# Patient Record
Sex: Male | Born: 1950 | Race: White | Hispanic: No | Marital: Married | Smoking: Former smoker
Health system: Southern US, Community
[De-identification: ages and names within clinical notes are randomized; demographics above are authoritative.]

## PROBLEM LIST (undated history)

## (undated) DIAGNOSIS — E785 Hyperlipidemia, unspecified: Secondary | ICD-10-CM

## (undated) DIAGNOSIS — R0600 Dyspnea, unspecified: Secondary | ICD-10-CM

## (undated) DIAGNOSIS — I10 Essential (primary) hypertension: Secondary | ICD-10-CM

## (undated) DIAGNOSIS — K219 Gastro-esophageal reflux disease without esophagitis: Secondary | ICD-10-CM

## (undated) DIAGNOSIS — J189 Pneumonia, unspecified organism: Secondary | ICD-10-CM

## (undated) DIAGNOSIS — Z86010 Personal history of colonic polyps: Principal | ICD-10-CM

## (undated) HISTORY — PX: HERNIA REPAIR: SHX51

## (undated) HISTORY — PX: UPPER GI ENDOSCOPY: SHX6162

## (undated) HISTORY — DX: Hyperlipidemia, unspecified: E78.5

## (undated) HISTORY — PX: UPPER GASTROINTESTINAL ENDOSCOPY: SHX188

## (undated) HISTORY — PX: FRACTURE SURGERY: SHX138

## (undated) HISTORY — DX: Essential (primary) hypertension: I10

## (undated) HISTORY — DX: Personal history of colonic polyps: Z86.010

## (undated) HISTORY — PX: WISDOM TOOTH EXTRACTION: SHX21

## (undated) HISTORY — PX: OTHER SURGICAL HISTORY: SHX169

## (undated) SURGERY — Surgical Case
Anesthesia: *Unknown

---

## 2003-09-07 HISTORY — PX: COLONOSCOPY: SHX174

## 2006-07-13 ENCOUNTER — Ambulatory Visit: Payer: Self-pay | Admitting: Internal Medicine

## 2006-07-22 ENCOUNTER — Ambulatory Visit: Payer: Self-pay | Admitting: Internal Medicine

## 2006-07-22 DIAGNOSIS — Z8719 Personal history of other diseases of the digestive system: Secondary | ICD-10-CM

## 2006-07-22 HISTORY — DX: Personal history of other diseases of the digestive system: Z87.19

## 2006-09-07 ENCOUNTER — Ambulatory Visit: Payer: Self-pay | Admitting: Internal Medicine

## 2007-08-14 ENCOUNTER — Ambulatory Visit: Payer: Self-pay | Admitting: Internal Medicine

## 2007-09-14 ENCOUNTER — Ambulatory Visit: Payer: Self-pay | Admitting: Internal Medicine

## 2007-12-26 ENCOUNTER — Encounter: Admission: RE | Admit: 2007-12-26 | Discharge: 2007-12-26 | Payer: Self-pay | Admitting: Family Medicine

## 2010-04-30 ENCOUNTER — Encounter: Admission: RE | Admit: 2010-04-30 | Discharge: 2010-04-30 | Payer: Self-pay | Admitting: Family Medicine

## 2011-01-22 NOTE — Assessment & Plan Note (Signed)
[facility]                           GASTROENTEROLOGY OFFICE NOTE   NAME:Russell [name], Russell [name]                        MRN:          [id number]  DATE:07/13/2006                            DOB:          December 21, 1950    REASON FOR CONSULTATION:  Reflux, dysphagia.   ASSESSMENT:  A 60 year old white man with several-week history of heartburn  and indigestion, as well as some mild solid food and liquid dysphagia.  Things seem to be improving on PPI therapy.   PLAN:  Because of the dysphagia history I think an endoscopy is appropriate.  If that is all unrevealing could consider a gallbladder ultrasound, but I do  not know that that would have caused dysphagia symptoms.  He has not had  chronic heartburn.  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  This is discussed with the patient in that quitting smoking is to  his benefit.   HISTORY:  A 60 year old white man who several weeks ago ate a large meal,  woke up with heartburn and indigestion problems with pyrosis.  He has had  some difficulty swallowing some solid food with a suprasternal sticking  point as well as liquids.  I do not think he has had to regurgitate  anything.  There has been a 6-pound weight loss because he has changed his  diet and he is avoiding a lot of spicy foods, etc.  In the past he would  have some intermittent heartburn and indigestion with spicy or hot foods,  but no regular basis of that.   CURRENT MEDICATIONS:  1. Protonix 40 mg daily (he had tried Prilosec OTC and was changed to      this, and was also given Carafate which he stopped).  2. Benicar 20/12.5 mg daily.  3. Glucosamine daily.  4. Baby aspirin daily.   DRUG ALLERGIES:  None known.   PAST MEDICAL HISTORY:  1. 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  2. Hypertension.  3. Hernia repair in 1993.   FAMILY HISTORY:  Noncontributory.  Mother had heart disease and diabetes.   SOCIAL HISTORY:  He is  married, works at [employer], one daughter.  Three to  four alcoholic beverages a week.  Smokes a pack per day.   REVIEW OF SYSTEMS:  All other systems are negative.   PHYSICAL EXAMINATION:  Well-developed, well-nourished white man.  Height 5  feet 10 inches, weight 164 pounds, blood pressure 150/80, pulse 72.  EYES:  Anicteric.  MOUTH:  Posterior pharynx shows some mild posterior pharyngeal erythema (no  hoarseness, cough, sore throat).  NECK:  Supple, no mass or thyromegaly.  CHEST:  Clear.  HEART:  S1, S2.  No murmurs or gallops.  ABDOMEN:  Soft, nontender, no organomegaly.  EXTREMITIES:  No edema.  LYMPH NODES:  No neck or supraclavicular nodes.  PSYCHIATRIC:  He is alert and oriented x3.   I appreciate the opportunity to care for this patient.     Iva Boop, MD,FACG  Electronically Signed    CEG/MedQ  DD: 07/13/2006  DT: 07/13/2006  Job #:  [id number]   cc:   Loel Lofty, M.D.

## 2011-01-22 NOTE — Assessment & Plan Note (Signed)
[facility]                         GASTROENTEROLOGY OFFICE NOTE   NAME:[name], [name]                        MRN:          [id number]  DATE:09/07/2006                            DOB:          [birth year]    CHIEF COMPLAINT:  Followup of reflux.   He stopped his Protonix a little while ago, actually after his  procedure, and he has had no more heartburn or dysphagia.  He had grade  A reflux esophagitis, and what I think was a muscular ring and a small  sliding hiatal hernia.  His other medications are listed in the chart.  He is still smoking and is counseled to quit.   Weight 165 pounds, height 5 feet 11 inches, pulse 65, blood pressure  110/70.   ASSESSMENT:  1. Gastroesophageal reflux disease with a hiatal hernia.  2. Dysphagia.  He is asymptomatic off medications at this time.  He      really had some sort of an acute flare of symptoms that has come      and gone.   RECOMMENDATIONS AND PLAN:  Leave him off of proton pump inhibitor  therapy at this time.  Work on stopping smoking.  If he develops  symptoms again, go back on a proton pump inhibitor and let me know.  I  told him that he may go on to require chronic therapy.  We just do not  know at this point.     Iva Boop, MD,FACG  Electronically Signed    CEG/MedQ  DD: 09/07/2006  DT: 09/07/2006  Job #: [id number]   cc:   Russell [name], M.D.

## 2011-03-04 IMAGING — CT CT CHEST W/ CM
3 of 4 series · 17 of 30 positions shown, 19 images · IV contrast (agent unspecified)
Comparison: [facility] chest radiographs 04/22/2010 (no
report available).

CLINICAL DATA: Possible right lung nodule.  No history of
malignancy.

CT CHEST WITH CONTRAST
TECHNIQUE: Multidetector CT imaging of the chest was performed
following the standard protocol during bolus administration of
intravenous contrast.
Contrast: 75 ml Ymnipaque-AQQ intravenously.

[Series 3: routine chest · axial · 0.76mm/px · z∈[-254,-39]mm · 5 of 65 slices shown, 7 images]
[im 11/65  mediastinal]
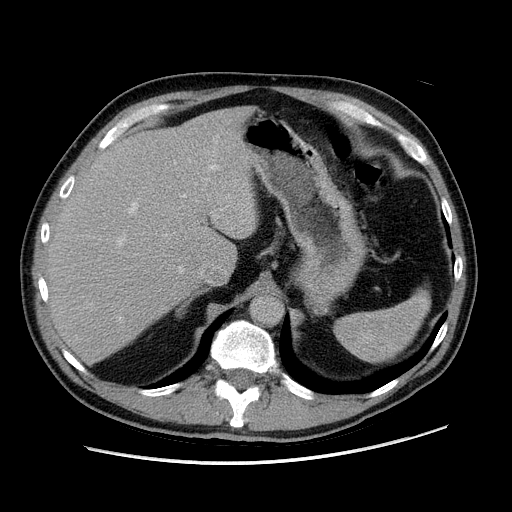
[im 11/65  lung]
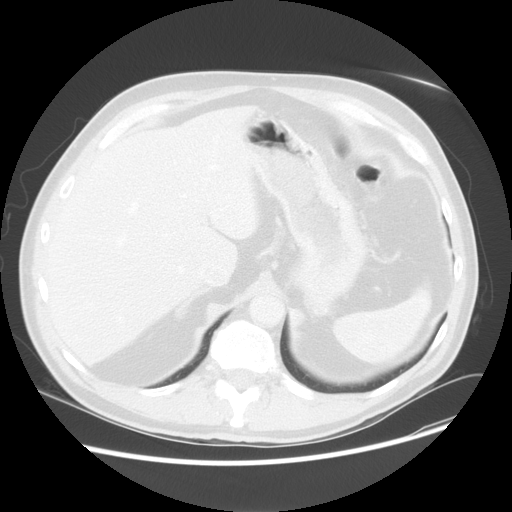
[im 22/65  lung]
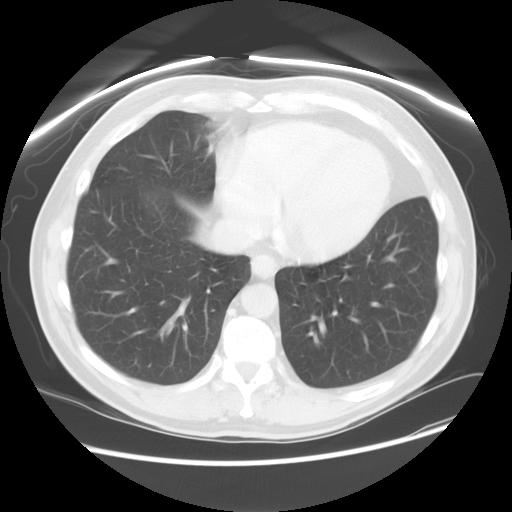
[im 33/65  lung]
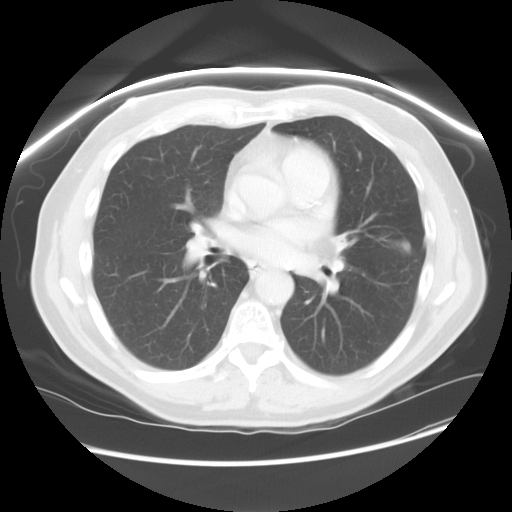
[im 43/65  lung]
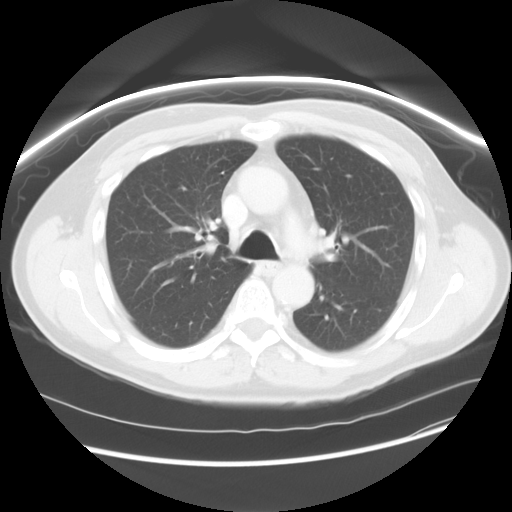
[im 54/65  mediastinal]
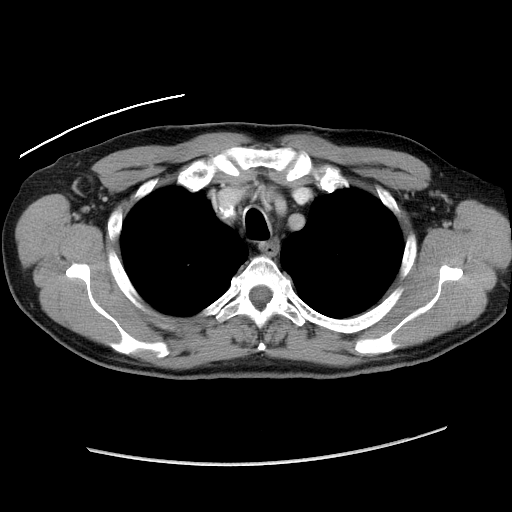
[im 54/65  lung]
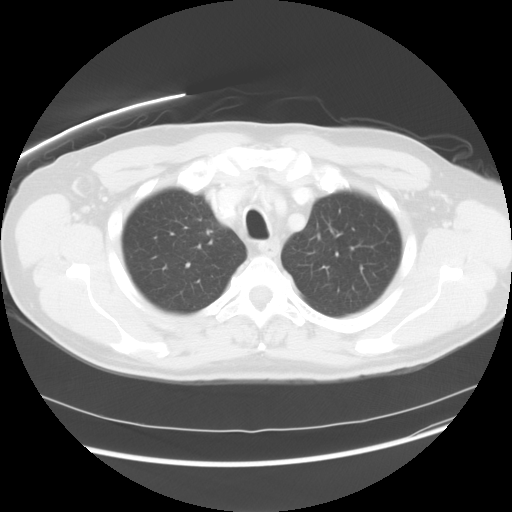

[Series 4: lung windows · axial · 0.76mm/px · z∈[-224,-44]mm · 4 of 61 slices shown]
[im 13/61  lung]
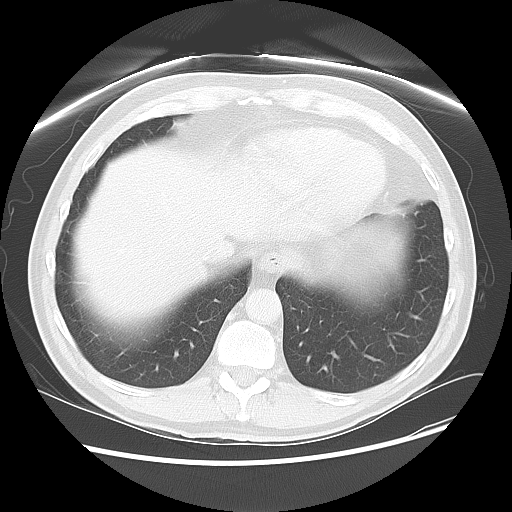
[im 25/61  lung]
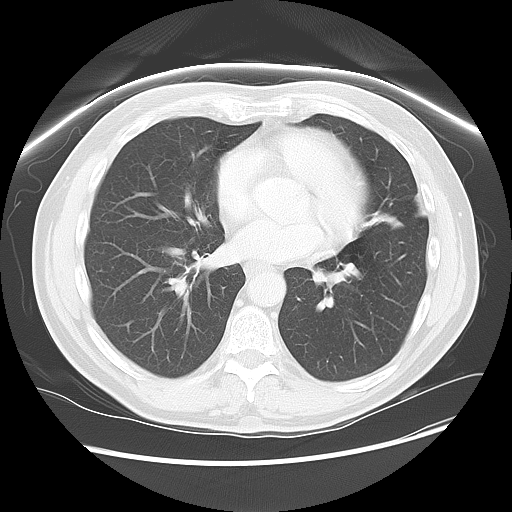
[im 37/61  lung]
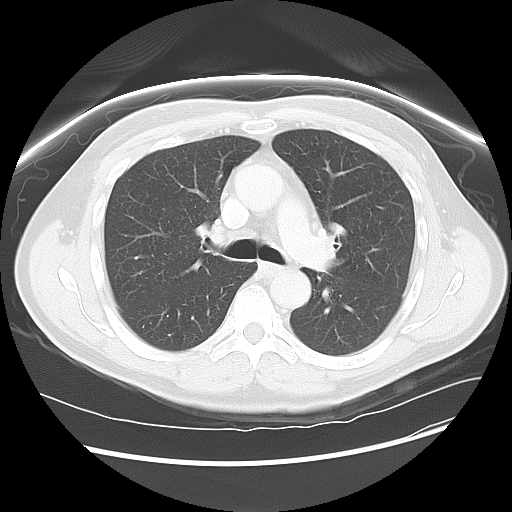
[im 49/61  lung]
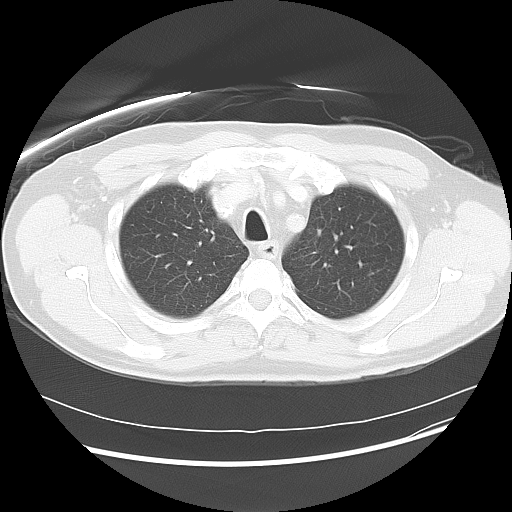

[Series 602: sagittal body · sagittal · 0.76mm/px · 8 of 156 slices shown]
[im 12/156  mediastinal]
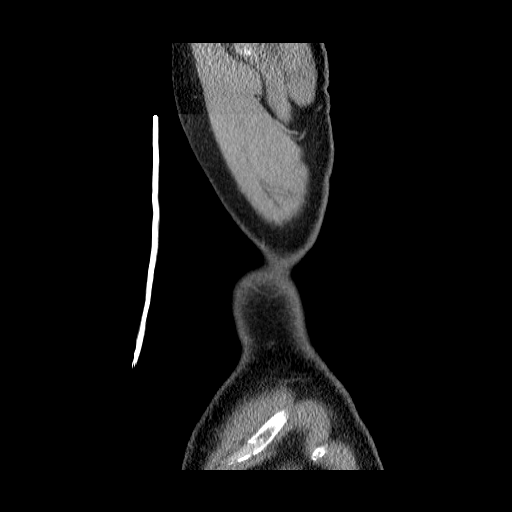
[im 34/156  mediastinal]
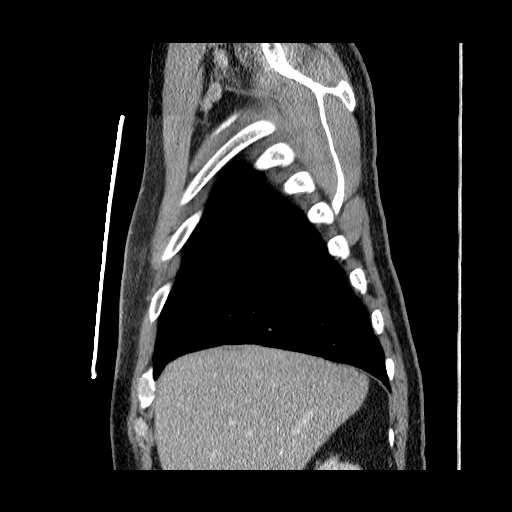
[im 56/156  mediastinal]
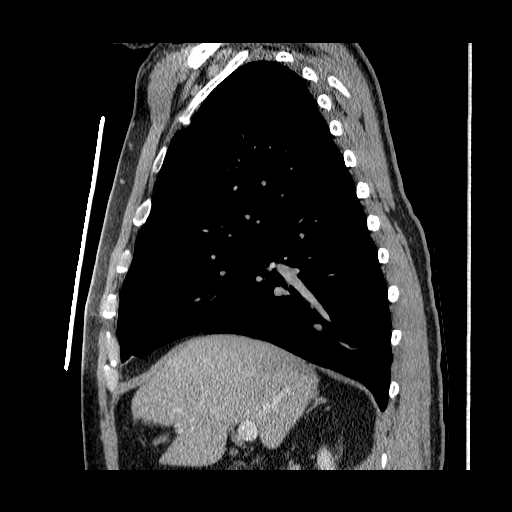
[im 67/156  mediastinal]
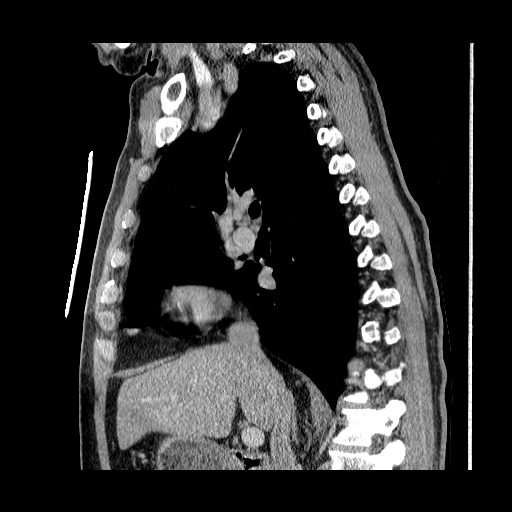
[im 89/156  mediastinal]
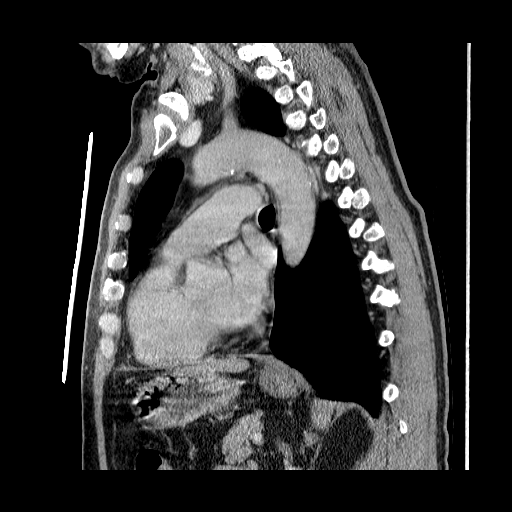
[im 100/156  mediastinal]
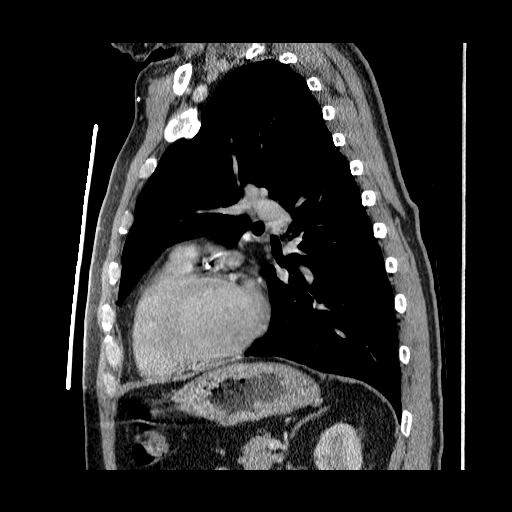
[im 122/156  mediastinal]
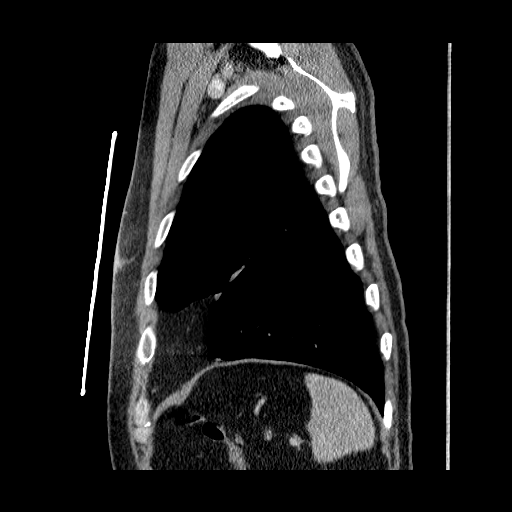
[im 144/156  mediastinal]
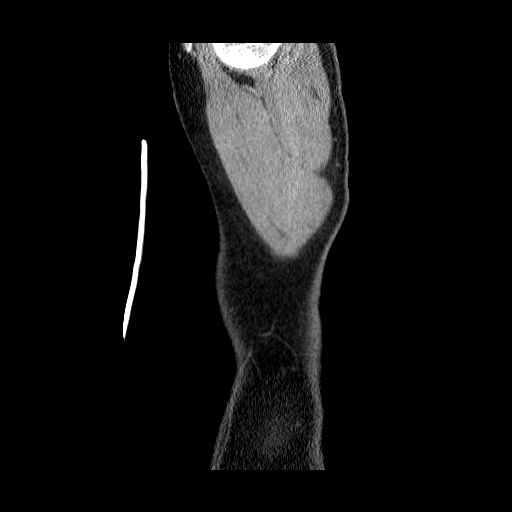

[17 of 30 positions shown; findings below may reference images not displayed]

FINDINGS: There are no enlarged mediastinal or hilar lymph nodes.
There is no pleural or pericardial effusion.  Coronary artery
calcifications are noted.

There is no evidence of pulmonary nodule.  The right lung is clear.
There is lingular atelectasis or scarring along the inferior aspect
of the major fissure adjacent to the left heart border.  There is
no confluent airspace opacity.

The visualized upper abdomen appears unremarkable.
IMPRESSION: 1.  No evidence of right basilar pulmonary nodule.
2.  Lingular scarring or atelectasis is probably longstanding and
unchanged from recent radiographs.  Correlation with old films or
radiographic followup would be helpful to document stability.
3.  No lymphadenopathy or pleural effusion.
4.  Coronary artery disease.

## 2012-08-24 ENCOUNTER — Encounter: Payer: Self-pay | Admitting: Internal Medicine

## 2013-05-04 ENCOUNTER — Encounter: Payer: Self-pay | Admitting: Internal Medicine

## 2013-09-03 ENCOUNTER — Other Ambulatory Visit: Payer: Self-pay | Admitting: Nurse Practitioner

## 2013-10-02 ENCOUNTER — Other Ambulatory Visit: Payer: Self-pay | Admitting: Nurse Practitioner

## 2013-10-04 ENCOUNTER — Ambulatory Visit: Payer: Self-pay | Admitting: Family Medicine

## 2013-10-10 ENCOUNTER — Ambulatory Visit (INDEPENDENT_AMBULATORY_CARE_PROVIDER_SITE_OTHER): Payer: 59 | Admitting: Family Medicine

## 2013-10-10 ENCOUNTER — Encounter: Payer: Self-pay | Admitting: Family Medicine

## 2013-10-10 VITALS — BP 144/79 | HR 54 | Temp 97.9°F | Ht 70.0 in | Wt 174.0 lb

## 2013-10-10 DIAGNOSIS — Z0000 Encounter for general adult medical examination without abnormal findings: Secondary | ICD-10-CM

## 2013-10-10 LAB — POCT CBC
Granulocyte percent: 70.1 %G (ref 37–80)
HCT, POC: 46 % (ref 43.5–53.7)
Hemoglobin: 15.5 g/dL (ref 14.1–18.1)
Lymph, poc: 2.7 (ref 0.6–3.4)
MCH, POC: 33.1 pg — AB (ref 27–31.2)
MCHC: 33.7 g/dL (ref 31.8–35.4)
MCV: 98.1 fL — AB (ref 80–97)
MPV: 8.2 fL (ref 0–99.8)
POC Granulocyte: 6.9 (ref 2–6.9)
POC LYMPH PERCENT: 27.7 %L (ref 10–50)
Platelet Count, POC: 168 10*3/uL (ref 142–424)
RBC: 4.7 M/uL (ref 4.69–6.13)
RDW, POC: 13.2 %
WBC: 9.9 10*3/uL (ref 4.6–10.2)

## 2013-10-10 MED ORDER — OLMESARTAN MEDOXOMIL-HCTZ 20-12.5 MG PO TABS: 1.0000 | ORAL_TABLET | Freq: Every day | ORAL | Status: DC

## 2013-10-10 NOTE — Progress Notes (Signed)
   Subjective:    Patient ID: Russell [name], male    DOB: 02/24/1951, 62 y.o.   MRN: [id number]  HPI  This 62 y.o. male presents for evaluation of CPE and labs.  He has hx of hypertension.  Review of Systems    No chest pain, SOB, HA, dizziness, vision change, N/V, diarrhea, constipation, dysuria, urinary urgency or frequency, myalgias, arthralgias or rash.  Objective:   Physical Exam  Vital signs noted  Well developed well nourished male.  HEENT - Head atraumatic Normocephalic                Eyes - PERRLA, Conjuctiva - clear Sclera- Clear EOMI                Ears - EAC's Wnl TM's Wnl Gross Hearing WNL                Nose - Nares patent                 Throat - oropharanx wnl Respiratory - Lungs CTA bilateral Cardiac - RRR S1 and S2 without murmur. BP 130/70 GI - Abdomen soft Nontender and bowel sounds active x 4 Extremities - No edema. Neuro - Grossly intact.      Assessment & Plan:  Routine general medical examination at a health care facility - Plan: olmesartan-hydrochlorothiazide (BENICAR HCT) 20-12.5 MG per tablet, POCT CBC, CMP14+EGFR, PSA, total and free, Lipid panel, Vit D  25 hydroxy (rtn osteoporosis monitoring), TSH  William J Oxford FNP 

## 2013-10-12 ENCOUNTER — Telehealth: Payer: Self-pay | Admitting: Family Medicine

## 2013-10-12 ENCOUNTER — Other Ambulatory Visit: Payer: Self-pay | Admitting: Family Medicine

## 2013-10-12 LAB — CMP14+EGFR
ALT: 22 IU/L (ref 0–44)
AST: 22 IU/L (ref 0–40)
Albumin/Globulin Ratio: 2 (ref 1.1–2.5)
Albumin: 4.4 g/dL (ref 3.6–4.8)
Alkaline Phosphatase: 62 IU/L (ref 39–117)
BUN/Creatinine Ratio: 25 — ABNORMAL HIGH (ref 10–22)
BUN: 23 mg/dL (ref 8–27)
CO2: 24 mmol/L (ref 18–29)
Calcium: 9.6 mg/dL (ref 8.6–10.2)
Chloride: 101 mmol/L (ref 97–108)
Creatinine, Ser: 0.92 mg/dL (ref 0.76–1.27)
GFR calc Af Amer: 103 mL/min/{1.73_m2} (ref >59)
GFR calc non Af Amer: 89 mL/min/{1.73_m2} (ref >59)
Globulin, Total: 2.2 g/dL (ref 1.5–4.5)
Glucose: 78 mg/dL (ref 65–99)
Potassium: 6.3 mmol/L (ref 3.5–5.2)
Sodium: 139 mmol/L (ref 134–144)
Total Bilirubin: 0.3 mg/dL (ref 0.0–1.2)
Total Protein: 6.6 g/dL (ref 6.0–8.5)

## 2013-10-12 LAB — VITAMIN D 25 HYDROXY (VIT D DEFICIENCY, FRACTURES): Vit D, 25-Hydroxy: 69 ng/mL (ref 30.0–100.0)

## 2013-10-12 LAB — LIPID PANEL
Chol/HDL Ratio: 3.3 ratio units (ref 0.0–5.0)
Cholesterol, Total: 198 mg/dL (ref 100–199)
HDL: 60 mg/dL (ref >39)
LDL Calculated: 113 mg/dL — ABNORMAL HIGH (ref 0–99)
Triglycerides: 123 mg/dL (ref 0–149)
VLDL Cholesterol Cal: 25 mg/dL (ref 5–40)

## 2013-10-12 LAB — PSA, TOTAL AND FREE
PSA, Free Pct: 32 %
PSA, Free: 0.16 ng/mL
PSA: 0.5 ng/mL (ref 0.0–4.0)

## 2013-10-12 LAB — POTASSIUM: Potassium: 3.9 mEq/L (ref 3.5–5.3)

## 2013-10-12 LAB — TSH: TSH: 2.84 u[IU]/mL (ref 0.450–4.500)

## 2013-10-12 NOTE — Telephone Encounter (Signed)
Spoke with patient and advised to go to [facility] per Dr Sabas Sous order to have potassium rechecked

## 2013-10-31 ENCOUNTER — Other Ambulatory Visit: Payer: Self-pay | Admitting: Family Medicine

## 2014-10-11 ENCOUNTER — Ambulatory Visit (INDEPENDENT_AMBULATORY_CARE_PROVIDER_SITE_OTHER): Payer: 59 | Admitting: Family Medicine

## 2014-10-11 ENCOUNTER — Encounter: Payer: Self-pay | Admitting: Family Medicine

## 2014-10-11 VITALS — BP 127/73 | HR 61 | Temp 98.2°F | Ht 70.0 in | Wt 172.0 lb

## 2014-10-11 DIAGNOSIS — I10 Essential (primary) hypertension: Secondary | ICD-10-CM | POA: Insufficient documentation

## 2014-10-11 DIAGNOSIS — Z0000 Encounter for general adult medical examination without abnormal findings: Secondary | ICD-10-CM

## 2014-10-11 LAB — POCT CBC
Granulocyte percent: 70.6 %G (ref 37–80)
HCT, POC: 46.8 % (ref 43.5–53.7)
HEMOGLOBIN: 14.9 g/dL (ref 14.1–18.1)
LYMPH, POC: 2.5 (ref 0.6–3.4)
MCH: 31 pg (ref 27–31.2)
MCHC: 31.9 g/dL (ref 31.8–35.4)
MCV: 97.1 fL — AB (ref 80–97)
MPV: 8.2 fL (ref 0–99.8)
PLATELET COUNT, POC: 205 10*3/uL (ref 142–424)
POC Granulocyte: 7.1 — AB (ref 2–6.9)
POC LYMPH %: 25.3 % (ref 10–50)
RBC: 4.8 M/uL (ref 4.69–6.13)
RDW, POC: 13 %
WBC: 10 10*3/uL (ref 4.6–10.2)

## 2014-10-12 ENCOUNTER — Other Ambulatory Visit: Payer: Self-pay | Admitting: Family Medicine

## 2014-10-12 LAB — PSA, TOTAL AND FREE
PSA FREE PCT: 30 %
PSA, Free: 0.15 ng/mL
PSA: 0.5 ng/mL (ref 0.0–4.0)

## 2014-10-12 LAB — CMP14+EGFR
A/G RATIO: 1.9 (ref 1.1–2.5)
ALBUMIN: 4.1 g/dL (ref 3.6–4.8)
ALT: 19 IU/L (ref 0–44)
AST: 18 IU/L (ref 0–40)
Alkaline Phosphatase: 64 IU/L (ref 39–117)
BILIRUBIN TOTAL: 0.3 mg/dL (ref 0.0–1.2)
BUN/Creatinine Ratio: 24 — ABNORMAL HIGH (ref 10–22)
BUN: 20 mg/dL (ref 8–27)
CALCIUM: 9.4 mg/dL (ref 8.6–10.2)
CO2: 26 mmol/L (ref 18–29)
Chloride: 102 mmol/L (ref 97–108)
Creatinine, Ser: 0.84 mg/dL (ref 0.76–1.27)
GFR calc Af Amer: 108 mL/min/{1.73_m2} (ref >59)
GFR calc non Af Amer: 93 mL/min/{1.73_m2} (ref >59)
GLOBULIN, TOTAL: 2.2 g/dL (ref 1.5–4.5)
Glucose: 76 mg/dL (ref 65–99)
Potassium: 4.4 mmol/L (ref 3.5–5.2)
SODIUM: 141 mmol/L (ref 134–144)
Total Protein: 6.3 g/dL (ref 6.0–8.5)

## 2014-10-12 LAB — LIPID PANEL
CHOL/HDL RATIO: 3 ratio (ref 0.0–5.0)
Cholesterol, Total: 187 mg/dL (ref 100–199)
HDL: 63 mg/dL (ref >39)
LDL CALC: 94 mg/dL (ref 0–99)
Triglycerides: 149 mg/dL (ref 0–149)
VLDL CHOLESTEROL CAL: 30 mg/dL (ref 5–40)

## 2014-10-15 NOTE — Progress Notes (Signed)
Subjective:  Patient ID: Russell [name], male    DOB: 1950-11-04  Age: 64 y.o. MRN: [id number]  CC: Annual Exam   HPI CHIKE FARRINGTON presents for complete physical exam.  Patient in for follow-up of hypertension. Patient has no history of headache chest pain or shortness of breath or recent cough. Patient also denies symptoms of TIA such as numbness weakness lateralizing. Patient checks  blood pressure at home and has not had any elevated readings recently.    History Abishai has a past medical history of Hypertension.   He has past surgical history that includes Hernia repair and Fracture surgery (Left, age 58).   His family history includes Diabetes in his mother; Heart disease in his mother; Hyperlipidemia in his father.He reports that he has been smoking.  He has never used smokeless tobacco. He reports that he drinks about 2.0 oz of alcohol per week. He reports that he does not use illicit drugs.  Current Outpatient Prescriptions on File Prior to Visit  Medication Sig Dispense Refill  . aspirin EC 81 MG tablet Take 81 mg by mouth every other day.      No current facility-administered medications on file prior to visit.    ROS Review of Systems  Constitutional: Negative for fever, chills, diaphoresis, activity change, appetite change, fatigue and unexpected weight change.  HENT: Negative for congestion, ear pain, hearing loss, postnasal drip, rhinorrhea, sore throat, tinnitus and trouble swallowing.   Eyes: Negative for photophobia, pain, discharge and redness.  Respiratory: Negative for apnea, cough, choking, chest tightness, shortness of breath, wheezing and stridor.   Cardiovascular: Negative for chest pain, palpitations and leg swelling.  Gastrointestinal: Negative for nausea, vomiting, abdominal pain, diarrhea, constipation, blood in stool and abdominal distention.  Endocrine: Negative for cold intolerance, heat intolerance, polydipsia, polyphagia and polyuria.    Genitourinary: Negative for dysuria, urgency, frequency, hematuria, flank pain, enuresis, difficulty urinating and genital sores.  Musculoskeletal: Negative for joint swelling and arthralgias.  Skin: Negative for color change, rash and wound.  Allergic/Immunologic: Negative for immunocompromised state.  Neurological: Negative for dizziness, tremors, seizures, syncope, facial asymmetry, speech difficulty, weakness, light-headedness, numbness and headaches.  Hematological: Does not bruise/bleed easily.  Psychiatric/Behavioral: Negative for suicidal ideas, hallucinations, behavioral problems, confusion, sleep disturbance, dysphoric mood, decreased concentration and agitation. The patient is not nervous/anxious and is not hyperactive.     Objective:  BP 127/73 mmHg  Pulse 61  Temp(Src) 98.2 F (36.8 C) (Oral)  Ht 5' 10" (1.778 m)  Wt 172 lb (78.019 kg)  BMI 24.68 kg/m2  BP Readings from Last 3 Encounters:  10/11/14 127/73  10/10/13 144/79    Wt Readings from Last 3 Encounters:  10/11/14 172 lb (78.019 kg)  10/10/13 174 lb (78.926 kg)     Physical Exam  Constitutional: He is oriented to person, place, and time. He appears well-developed and well-nourished.  HENT:  Head: Normocephalic and atraumatic.  Mouth/Throat: Oropharynx is clear and moist.  Eyes: EOM are normal. Pupils are equal, round, and reactive to light.  Neck: Normal range of motion. No tracheal deviation present. No thyromegaly present.  Cardiovascular: Normal rate, regular rhythm and normal heart sounds.  Exam reveals no gallop and no friction rub.   No murmur heard. Pulmonary/Chest: Breath sounds normal. He has no wheezes. He has no rales.  Abdominal: Soft. He exhibits no mass. There is no tenderness.  Musculoskeletal: Normal range of motion. He exhibits no edema.  Neurological: He is alert and oriented to person,  place, and time.  Skin: Skin is warm and dry.  Psychiatric: He has a normal mood and affect.     No results found for: HGBA1C  Lab Results  Component Value Date   WBC 10.0 10/11/2014   HGB 14.9 10/11/2014   HCT 46.8 10/11/2014   GLUCOSE 76 10/11/2014   TRIG 149 10/11/2014   HDL 63 10/11/2014   LDLCALC 94 10/11/2014   ALT 19 10/11/2014   AST 18 10/11/2014   NA 141 10/11/2014   K 4.4 10/11/2014   CL 102 10/11/2014   CREATININE 0.84 10/11/2014   BUN 20 10/11/2014   CO2 26 10/11/2014   TSH 2.840 10/10/2013   PSA 0.5 10/11/2014    Ct Chest W Contrast  04/30/2010   Clinical Data: Possible right lung nodule.  No history of malignancy.   CT CHEST WITH CONTRAST   Technique:  Multidetector CT imaging of the chest was performed following the standard protocol during bolus administration of intravenous contrast.   Contrast: 75 ml Omnipaque-300 intravenously.   Comparison: [facility] chest radiographs 04/22/2010 (no report available).   Findings: There are no enlarged mediastinal or hilar lymph nodes. There is no pleural or pericardial effusion.  Coronary artery calcifications are noted.   There is no evidence of pulmonary nodule.  The right lung is clear. There is lingular atelectasis or scarring along the inferior aspect of the major fissure adjacent to the left heart border.  There is no confluent airspace opacity.   The visualized upper abdomen appears unremarkable.   IMPRESSION:   1.  No evidence of right basilar pulmonary nodule. 2.  Lingular scarring or atelectasis is probably longstanding and unchanged from recent radiographs.  Correlation with old films or radiographic followup would be helpful to document stability. 3.  No lymphadenopathy or pleural effusion. 4.  Coronary artery disease.  Provider: [place]:   Reyli was seen today for annual exam.  Diagnoses and associated orders for this visit:  Annual physical exam - POCT CBC - CMP14+EGFR - PSA, total and free - Lipid panel    I am having Mr. Gail maintain his aspirin EC.  No  orders of the defined types were placed in this encounter.     Follow-up: Return for hypertension.  [name], M.D.

## 2015-04-02 ENCOUNTER — Encounter: Payer: Self-pay | Admitting: Internal Medicine

## 2015-04-16 ENCOUNTER — Ambulatory Visit (INDEPENDENT_AMBULATORY_CARE_PROVIDER_SITE_OTHER): Payer: Commercial Managed Care - HMO | Admitting: Family Medicine

## 2015-04-16 ENCOUNTER — Encounter: Payer: Self-pay | Admitting: Family Medicine

## 2015-04-16 VITALS — BP 124/74 | HR 60 | Temp 97.8°F | Ht 70.0 in | Wt 173.2 lb

## 2015-04-16 DIAGNOSIS — I10 Essential (primary) hypertension: Secondary | ICD-10-CM | POA: Diagnosis not present

## 2015-04-16 DIAGNOSIS — L723 Sebaceous cyst: Secondary | ICD-10-CM

## 2015-04-16 MED ORDER — OLMESARTAN MEDOXOMIL-HCTZ 20-12.5 MG PO TABS: 1.0000 | ORAL_TABLET | Freq: Every day | ORAL | Status: DC

## 2015-04-16 NOTE — Progress Notes (Signed)
Subjective:  Patient ID: Russell [name], male    DOB: Feb 01, 1951  Age: 64 y.o. MRN: [id number]  CC: Hypertension   HPI LESHAWN STRAKA presents for  follow-up of hypertension. Patient has no history of headache chest pain or shortness of breath or recent cough. Patient also denies symptoms of TIA such as numbness weakness lateralizing. Patient checks  blood pressure at home and has not had any elevated readings recently. Patient denies side effects from his medication. States taking it regularly.   History Erling has a past medical history of Hypertension.   He has past surgical history that includes Hernia repair and Fracture surgery (Left, age 65).   His family history includes Diabetes in his mother; Heart disease in his mother; Hyperlipidemia in his father.He reports that he has been smoking.  He has never used smokeless tobacco. He reports that he drinks about 2.0 oz of alcohol per week. He reports that he does not use illicit drugs.  Current Outpatient Prescriptions on File Prior to Visit  Medication Sig Dispense Refill  . aspirin EC 81 MG tablet Take 81 mg by mouth every other day.      No current facility-administered medications on file prior to visit.    ROS Review of Systems  Constitutional: Negative for fever, chills and diaphoresis.  HENT: Negative for congestion, rhinorrhea and sore throat.   Respiratory: Negative for cough, shortness of breath and wheezing.   Cardiovascular: Negative for chest pain.  Gastrointestinal: Negative for nausea, vomiting, abdominal pain, diarrhea, constipation and abdominal distention.  Genitourinary: Negative for dysuria and frequency.  Musculoskeletal: Negative for joint swelling and arthralgias.  Skin: Negative for rash.  Neurological: Negative for headaches.    Objective:  BP 124/74 mmHg  Pulse 60  Temp(Src) 97.8 F (36.6 C) (Oral)  Ht _0  (1.778 m)  Wt 173 lb 3.2 oz (78.563 kg)  BMI 24.85 kg/m2  BP Readings from Last 3  Encounters:  04/16/15 124/74  10/11/14 127/73  10/10/13 144/79    Wt Readings from Last 3 Encounters:  04/16/15 173 lb 3.2 oz (78.563 kg)  10/11/14 172 lb (78.019 kg)  10/10/13 174 lb (78.926 kg)     Physical Exam  Constitutional: He appears well-developed and well-nourished.  HENT:  Head: Normocephalic and atraumatic.  Right Ear: Tympanic membrane and external ear normal. No decreased hearing is noted.  Left Ear: Tympanic membrane and external ear normal. No decreased hearing is noted.  Mouth/Throat: No oropharyngeal exudate or posterior oropharyngeal erythema.  Eyes: Pupils are equal, round, and reactive to light.  Neck: Normal range of motion. Neck supple.  Cardiovascular: Normal rate and regular rhythm.   No murmur heard. Pulmonary/Chest: Breath sounds normal. No respiratory distress.  Abdominal: Soft. Bowel sounds are normal. He exhibits no mass. There is no tenderness.  Skin:  2 cysts on back. Raised, firm, rubbery. Left scapular area is 1 cm. Midline T11 region is 3 cm. Neither show sign of infection  Vitals reviewed.   No results found for: HGBA1C  Lab Results  Component Value Date   WBC 10.0 10/11/2014   HGB 14.9 10/11/2014   HCT 46.8 10/11/2014   GLUCOSE 76 10/11/2014   CHOL 187 10/11/2014   TRIG 149 10/11/2014   HDL 63 10/11/2014   LDLCALC 94 10/11/2014   ALT 19 10/11/2014   AST 18 10/11/2014   NA 141 10/11/2014   K 4.4 10/11/2014   CL 102 10/11/2014   CREATININE 0.84 10/11/2014   BUN 20  10/11/2014   CO2 26 10/11/2014   TSH 2.840 10/10/2013   PSA 0.5 10/11/2014    Ct Chest W Contrast  04/30/2010   Clinical Data: Possible right lung nodule.  No history of malignancy.   CT CHEST WITH CONTRAST   Technique:  Multidetector CT imaging of the chest was performed following the standard protocol during bolus administration of intravenous contrast.   Contrast: 75 ml Omnipaque-300 intravenously.   Comparison: [facility] chest radiographs 04/22/2010 (no  report available).   Findings: There are no enlarged mediastinal or hilar lymph nodes. There is no pleural or pericardial effusion.  Coronary artery calcifications are noted.   There is no evidence of pulmonary nodule.  The right lung is clear. There is lingular atelectasis or scarring along the inferior aspect of the major fissure adjacent to the left heart border.  There is no confluent airspace opacity.   The visualized upper abdomen appears unremarkable.   IMPRESSION:   1.  No evidence of right basilar pulmonary nodule. 2.  Lingular scarring or atelectasis is probably longstanding and unchanged from recent radiographs.  Correlation with old films or radiographic followup would be helpful to document stability. 3.  No lymphadenopathy or pleural effusion. 4.  Coronary artery disease.  Provider: [place]:   Jaskaran was seen today for hypertension.  Diagnoses and all orders for this visit:  Essential hypertension, benign -     BMP8+EGFR  Sebaceous cyst  Other orders -     olmesartan-hydrochlorothiazide (BENICAR HCT) 20-12.5 MG per tablet; Take 1 tablet by mouth daily.   I have changed Mr. Isaak Delmundo HCT to olmesartan-hydrochlorothiazide. I am also having him maintain his aspirin EC.  Meds ordered this encounter  Medications  . olmesartan-hydrochlorothiazide (BENICAR HCT) 20-12.5 MG per tablet    Sig: Take 1 tablet by mouth daily.    Dispense:  90 tablet    Refill:  4     Follow-up: Return in about 6 months (around 10/17/2015) for CPE, hypertension.  [name], M.D.

## 2015-04-17 LAB — BMP8+EGFR
BUN / CREAT RATIO: 17 (ref 10–22)
BUN: 15 mg/dL (ref 8–27)
CHLORIDE: 101 mmol/L (ref 97–108)
CO2: 26 mmol/L (ref 18–29)
Calcium: 9 mg/dL (ref 8.6–10.2)
Creatinine, Ser: 0.88 mg/dL (ref 0.76–1.27)
GFR, EST AFRICAN AMERICAN: 105 mL/min/{1.73_m2} (ref >59)
GFR, EST NON AFRICAN AMERICAN: 91 mL/min/{1.73_m2} (ref >59)
Glucose: 80 mg/dL (ref 65–99)
POTASSIUM: 4.5 mmol/L (ref 3.5–5.2)
Sodium: 140 mmol/L (ref 134–144)

## 2015-11-05 ENCOUNTER — Ambulatory Visit (INDEPENDENT_AMBULATORY_CARE_PROVIDER_SITE_OTHER): Payer: Commercial Managed Care - HMO | Admitting: Family Medicine

## 2015-11-05 ENCOUNTER — Encounter: Payer: Self-pay | Admitting: Family Medicine

## 2015-11-05 VITALS — BP 109/71 | HR 70 | Temp 97.7°F | Ht 70.0 in | Wt 170.0 lb

## 2015-11-05 DIAGNOSIS — I10 Essential (primary) hypertension: Secondary | ICD-10-CM

## 2015-11-05 DIAGNOSIS — Z0000 Encounter for general adult medical examination without abnormal findings: Secondary | ICD-10-CM | POA: Diagnosis not present

## 2015-11-05 DIAGNOSIS — Z86010 Personal history of colonic polyps: Secondary | ICD-10-CM

## 2015-11-05 DIAGNOSIS — IMO0001 Reserved for inherently not codable concepts without codable children: Secondary | ICD-10-CM

## 2015-11-05 NOTE — Patient Instructions (Signed)

## 2015-11-05 NOTE — Progress Notes (Signed)
Subjective:  Patient ID: Russell [name], male    DOB: 25-Jan-1951  Age: 65 y.o. MRN: [id number]  CC: Annual Exam   HPI Russell [name] presents for well visit and follow-up of hypertension. Patient has no history of headache chest pain or shortness of breath or recent cough. Patient also denies symptoms of TIA such as numbness weakness lateralizing. Patient checks  blood pressure at home and has not had any elevated readings recently. Patient denies side effects from his medication. States taking it regularly.    History Russell [name] has a past medical history of Hypertension.   He has past surgical history that includes Hernia repair and Fracture surgery (Left, age 82).   His family history includes Diabetes in his mother; Heart disease in his mother; Hyperlipidemia in his father.He reports that he has been smoking.  He has never used smokeless tobacco. He reports that he drinks about 2.0 oz of alcohol per week. He reports that he does not use illicit drugs.    ROS Review of Systems  Constitutional: Negative for fever, chills, diaphoresis, activity change, appetite change, fatigue and unexpected weight change.  HENT: Negative for congestion, ear pain, hearing loss, postnasal drip, rhinorrhea, sore throat, tinnitus and trouble swallowing.   Eyes: Negative for photophobia, pain, discharge and redness.  Respiratory: Negative for apnea, cough, choking, chest tightness, shortness of breath, wheezing and stridor.   Cardiovascular: Negative for chest pain, palpitations and leg swelling.  Gastrointestinal: Negative for nausea, vomiting, abdominal pain, diarrhea, constipation, blood in stool and abdominal distention.  Endocrine: Negative for cold intolerance, heat intolerance, polydipsia, polyphagia and polyuria.  Genitourinary: Negative for dysuria, urgency, frequency, hematuria, flank pain, enuresis, difficulty urinating and genital sores.  Musculoskeletal: Negative for joint swelling and arthralgias.    Skin: Negative for color change, rash and wound.  Allergic/Immunologic: Negative for immunocompromised state.  Neurological: Negative for dizziness, tremors, seizures, syncope, facial asymmetry, speech difficulty, weakness, light-headedness, numbness and headaches.  Hematological: Does not bruise/bleed easily.  Psychiatric/Behavioral: Negative for suicidal ideas, hallucinations, behavioral problems, confusion, sleep disturbance, dysphoric mood, decreased concentration and agitation. The patient is not nervous/anxious and is not hyperactive.     Objective:  BP 109/71 mmHg  Pulse 70  Temp(Src) 97.7 F (36.5 C) (Oral)  Ht '5\' 10"'  (1.778 m)  Wt 170 lb (77.111 kg)  BMI 24.39 kg/m2  SpO2 98%  BP Readings from Last 3 Encounters:  11/05/15 109/71  04/16/15 124/74  10/11/14 127/73    Wt Readings from Last 3 Encounters:  11/05/15 170 lb (77.111 kg)  04/16/15 173 lb 3.2 oz (78.563 kg)  10/11/14 172 lb (78.019 kg)     Physical Exam  Constitutional: He is oriented to person, place, and time. He appears well-developed and well-nourished.  HENT:  Head: Normocephalic and atraumatic.  Mouth/Throat: Oropharynx is clear and moist.  Eyes: EOM are normal. Pupils are equal, round, and reactive to light.  Neck: Normal range of motion. No tracheal deviation present. No thyromegaly present.  Cardiovascular: Normal rate, regular rhythm and normal heart sounds.  Exam reveals no gallop and no friction rub.   No murmur heard. Pulmonary/Chest: Breath sounds normal. He has no wheezes. He has no rales.  Abdominal: Soft. He exhibits no mass. There is no tenderness.  Genitourinary: Rectum normal, prostate normal and penis normal. No penile tenderness.  Musculoskeletal: Normal range of motion. He exhibits no edema.  Neurological: He is alert and oriented to person, place, and time. He has normal reflexes. No cranial nerve  deficit. He exhibits normal muscle tone.  Skin: Skin is warm and dry.  Psychiatric:  He has a normal mood and affect.     Lab Results  Component Value Date   WBC 10.0 10/11/2014   HGB 14.9 10/11/2014   HCT 46.8 10/11/2014   GLUCOSE 80 04/16/2015   CHOL 187 10/11/2014   TRIG 149 10/11/2014   HDL 63 10/11/2014   LDLCALC 94 10/11/2014   ALT 19 10/11/2014   AST 18 10/11/2014   NA 140 04/16/2015   K 4.5 04/16/2015   CL 101 04/16/2015   CREATININE 0.88 04/16/2015   BUN 15 04/16/2015   CO2 26 04/16/2015   TSH 2.840 10/10/2013   PSA 0.5 10/11/2014    Ct Chest W Contrast  04/30/2010  Clinical Data: Possible right lung nodule.  No history of malignancy.  CT CHEST WITH CONTRAST  Technique:  Multidetector CT imaging of the chest was performed following the standard protocol during bolus administration of intravenous contrast.  Contrast: 75 ml Omnipaque-300 intravenously.  Comparison: [facility] chest radiographs 04/22/2010 (no report available).  Findings: There are no enlarged mediastinal or hilar lymph nodes. There is no pleural or pericardial effusion.  Coronary artery calcifications are noted.  There is no evidence of pulmonary nodule.  The right lung is clear. There is lingular atelectasis or scarring along the inferior aspect of the major fissure adjacent to the left heart border.  There is no confluent airspace opacity.  The visualized upper abdomen appears unremarkable.  IMPRESSION:  1.  No evidence of right basilar pulmonary nodule. 2.  Lingular scarring or atelectasis is probably longstanding and unchanged from recent radiographs.  Correlation with old films or radiographic followup would be helpful to document stability. 3.  No lymphadenopathy or pleural effusion. 4.  Coronary artery disease. Provider: [place]:   Russell [name] was seen today for annual exam.  Diagnoses and all orders for this visit:  Essential hypertension -     CMP14+EGFR  Well adult -     CBC with Differential/Platelet -     CMP14+EGFR -     Lipid panel -      POCT urinalysis dipstick -     Vitamin D 1,25 dihydroxy -     PSA Total (Reflex To Free) -     Fecal occult blood, imunochemical -     Hepatitis c antibody (reflex) -     HIV antibody -     Ambulatory referral to Gastroenterology  History of colonic polyps -     Ambulatory referral to Gastroenterology      I am having Russell [name] maintain his aspirin EC and olmesartan-hydrochlorothiazide.  No orders of the defined types were placed in this encounter.     Follow-up: Return in about 6 months (around 05/07/2016) for hypertension.  [name], M.D.

## 2015-11-06 ENCOUNTER — Other Ambulatory Visit: Payer: Self-pay

## 2015-11-06 MED ORDER — OLMESARTAN MEDOXOMIL-HCTZ 20-12.5 MG PO TABS: 1.0000 | ORAL_TABLET | Freq: Every day | ORAL | Status: DC

## 2015-11-07 LAB — FECAL OCCULT BLOOD, IMMUNOCHEMICAL: FECAL OCCULT BLD: NEGATIVE

## 2015-11-08 ENCOUNTER — Other Ambulatory Visit: Payer: Commercial Managed Care - HMO

## 2015-11-08 LAB — POCT URINALYSIS DIPSTICK
Bilirubin, UA: NEGATIVE
GLUCOSE UA: NEGATIVE
KETONES UA: NEGATIVE
Leukocytes, UA: NEGATIVE
Nitrite, UA: NEGATIVE
Protein, UA: NEGATIVE
SPEC GRAV UA: 1.015
UROBILINOGEN UA: NEGATIVE
pH, UA: 6.5

## 2015-11-13 LAB — CBC WITH DIFFERENTIAL/PLATELET
BASOS: 1 %
Basophils Absolute: 0.1 10*3/uL (ref 0.0–0.2)
EOS (ABSOLUTE): 0.3 10*3/uL (ref 0.0–0.4)
EOS: 3 %
HEMATOCRIT: 43 % (ref 37.5–51.0)
HEMOGLOBIN: 15.6 g/dL (ref 12.6–17.7)
Immature Grans (Abs): 0 10*3/uL (ref 0.0–0.1)
Immature Granulocytes: 0 %
LYMPHS ABS: 1.4 10*3/uL (ref 0.7–3.1)
Lymphs: 14 %
MCH: 33.3 pg — AB (ref 26.6–33.0)
MCHC: 36.3 g/dL — AB (ref 31.5–35.7)
MCV: 92 fL (ref 79–97)
MONOCYTES: 5 %
Monocytes Absolute: 0.5 10*3/uL (ref 0.1–0.9)
NEUTROS ABS: 7.6 10*3/uL — AB (ref 1.4–7.0)
Neutrophils: 77 %
PLATELETS: 218 10*3/uL (ref 150–379)
RBC: 4.68 x10E6/uL (ref 4.14–5.80)
RDW: 13 % (ref 12.3–15.4)
WBC: 9.9 10*3/uL (ref 3.4–10.8)

## 2015-11-13 LAB — PSA TOTAL (REFLEX TO FREE): Prostate Specific Ag, Serum: 0.6 ng/mL (ref 0.0–4.0)

## 2015-11-13 LAB — VITAMIN D 1,25 DIHYDROXY
Vitamin D 1, 25 (OH)2 Total: 36 pg/mL
Vitamin D3 1, 25 (OH)2: 36 pg/mL

## 2015-11-13 LAB — LIPID PANEL
CHOL/HDL RATIO: 3.1 ratio (ref 0.0–5.0)
CHOLESTEROL TOTAL: 179 mg/dL (ref 100–199)
HDL: 58 mg/dL (ref >39)
LDL CALC: 112 mg/dL — AB (ref 0–99)
TRIGLYCERIDES: 45 mg/dL (ref 0–149)
VLDL CHOLESTEROL CAL: 9 mg/dL (ref 5–40)

## 2015-11-13 LAB — CMP14+EGFR
ALK PHOS: 63 IU/L (ref 39–117)
ALT: 14 IU/L (ref 0–44)
AST: 17 IU/L (ref 0–40)
Albumin/Globulin Ratio: 2.2 (ref 1.1–2.5)
Albumin: 4.3 g/dL (ref 3.6–4.8)
BUN/Creatinine Ratio: 15 (ref 10–22)
BUN: 13 mg/dL (ref 8–27)
Bilirubin Total: 0.4 mg/dL (ref 0.0–1.2)
CALCIUM: 9 mg/dL (ref 8.6–10.2)
CO2: 24 mmol/L (ref 18–29)
CREATININE: 0.88 mg/dL (ref 0.76–1.27)
Chloride: 99 mmol/L (ref 96–106)
GFR calc Af Amer: 104 mL/min/{1.73_m2} (ref >59)
GFR calc non Af Amer: 90 mL/min/{1.73_m2} (ref >59)
GLOBULIN, TOTAL: 2 g/dL (ref 1.5–4.5)
GLUCOSE: 101 mg/dL — AB (ref 65–99)
POTASSIUM: 4.9 mmol/L (ref 3.5–5.2)
SODIUM: 137 mmol/L (ref 134–144)
Total Protein: 6.3 g/dL (ref 6.0–8.5)

## 2015-11-13 LAB — HEPATITIS C ANTIBODY (REFLEX)

## 2015-11-13 LAB — HCV COMMENT:

## 2015-11-13 LAB — HIV ANTIBODY (ROUTINE TESTING W REFLEX): HIV SCREEN 4TH GENERATION: NONREACTIVE

## 2016-03-01 ENCOUNTER — Encounter: Payer: Self-pay | Admitting: Internal Medicine

## 2016-03-12 ENCOUNTER — Ambulatory Visit (AMBULATORY_SURGERY_CENTER): Payer: Self-pay | Admitting: *Deleted

## 2016-03-12 VITALS — Ht 69.5 in | Wt 166.8 lb

## 2016-03-12 DIAGNOSIS — Z86010 Personal history of colonic polyps: Secondary | ICD-10-CM

## 2016-03-12 NOTE — Progress Notes (Signed)
Patient denies any allergies to egg or soy products. Patient denies complications with anesthesia/sedation.  Patient denies oxygen use at home and denies diet medications. Emmi instructions for colonoscopy but patient denied.   

## 2016-03-18 ENCOUNTER — Encounter: Payer: Self-pay | Admitting: Internal Medicine

## 2016-03-18 ENCOUNTER — Ambulatory Visit (AMBULATORY_SURGERY_CENTER): Payer: Commercial Managed Care - HMO | Admitting: Internal Medicine

## 2016-03-18 VITALS — BP 121/69 | HR 59 | Temp 98.4°F | Resp 11 | Ht 69.5 in | Wt 166.0 lb

## 2016-03-18 DIAGNOSIS — D124 Benign neoplasm of descending colon: Secondary | ICD-10-CM | POA: Diagnosis not present

## 2016-03-18 DIAGNOSIS — Z86010 Personal history of colonic polyps: Secondary | ICD-10-CM

## 2016-03-18 MED ORDER — SODIUM CHLORIDE 0.9 % IV SOLN: 500.0000 mL | INTRAVENOUS | Status: DC

## 2016-03-18 NOTE — Progress Notes (Signed)
A/ox3 pleased with MAC, report to April RN 

## 2016-03-18 NOTE — Patient Instructions (Addendum)
I saw and removed one polyp that looks benign. I will let you know pathology results and when to have another routine colonoscopy by mail.  You also have a condition called diverticulosis - common and not usually a problem. Please read the handout provided.  I appreciate the opportunity to care for you. Gatha Mayer, MD, [facility]  Discharge instructions given. Handouts on polyps and diverticulosis. Resume previous medications. YOU HAD AN ENDOSCOPIC PROCEDURE TODAY AT [facility]:   Refer to the procedure report that was given to you for any specific questions about what was found during the examination.  If the procedure report does not answer your questions, please call your gastroenterologist to clarify.  If you requested that your care partner not be given the details of your procedure findings, then the procedure report has been included in a sealed envelope for you to review at your convenience later.  YOU SHOULD EXPECT: Some feelings of bloating in the abdomen. Passage of more gas than usual.  Walking can help get rid of the air that was put into your GI tract during the procedure and reduce the bloating. If you had a lower endoscopy (such as a colonoscopy or flexible sigmoidoscopy) you may notice spotting of blood in your stool or on the toilet paper. If you underwent a bowel prep for your procedure, you may not have a normal bowel movement for a few days.  Please Note:  You might notice some irritation and congestion in your nose or some drainage.  This is from the oxygen used during your procedure.  There is no need for concern and it should clear up in a day or so.  SYMPTOMS TO REPORT IMMEDIATELY:   Following lower endoscopy (colonoscopy or flexible sigmoidoscopy):  Excessive amounts of blood in the stool  Significant tenderness or worsening of abdominal pains  Swelling of the abdomen that is new, acute  Fever of 100F or higher For urgent or emergent  issues, a gastroenterologist can be reached at any hour by calling [phone].   DIET: Your first meal following the procedure should be a small meal and then it is ok to progress to your normal diet. Heavy or fried foods are harder to digest and may make you feel nauseous or bloated.  Likewise, meals heavy in dairy and vegetables can increase bloating.  Drink plenty of fluids but you should avoid alcoholic beverages for 24 hours.  ACTIVITY:  You should plan to take it easy for the rest of today and you should NOT DRIVE or use heavy machinery until tomorrow (because of the sedation medicines used during the test).    FOLLOW UP: Our staff will call the number listed on your records the next business day following your procedure to check on you and address any questions or concerns that you may have regarding the information given to you following your procedure. If we do not reach you, we will leave a message.  However, if you are feeling well and you are not experiencing any problems, there is no need to return our call.  We will assume that you have returned to your regular daily activities without incident.  If any biopsies were taken you will be contacted by phone or by letter within the next 1-3 weeks.  Please call us at [phone] if you have not heard about the biopsies in 3 weeks.    SIGNATURES/CONFIDENTIALITY: You and/or your care partner have signed paperwork which will be  entered into your electronic medical record.  These signatures attest to the fact that that the information above on your After Visit Summary has been reviewed and is understood.  Full responsibility of the confidentiality of this discharge information lies with you and/or your care-partner. 

## 2016-03-18 NOTE — Op Note (Signed)
[place] Patient Name: Russell [name] Procedure Date: 03/18/2016 11:28 AM MRN: [id number] Endoscopist: Gatha Mayer , MD Age: 65 Referring MD:  Date of Birth: Aug 12, 1951 Gender: Male Account #: [id number] Procedure:                Colonoscopy Indications:              Surveillance: Personal history of adenomatous                            polyps on last colonoscopy > 5 years ago Medicines:                Propofol per Anesthesia, Monitored Anesthesia Care Procedure:                Pre-Anesthesia Assessment:                           - Prior to the procedure, a History and Physical                            was performed, and patient medications and                            allergies were reviewed. The patient's tolerance of                            previous anesthesia was also reviewed. The risks                            and benefits of the procedure and the sedation                            options and risks were discussed with the patient.                            All questions were answered, and informed consent                            was obtained. Prior Anticoagulants: The patient                            last took aspirin 2 days prior to the procedure.                            ASA Grade Assessment: II - A patient with mild                            systemic disease. After reviewing the risks and                            benefits, the patient was deemed in satisfactory                            condition to undergo the procedure.  After obtaining informed consent, the colonoscope                            was passed under direct vision. Throughout the                            procedure, the patient's blood pressure, pulse, and                            oxygen saturations were monitored continuously. The                            Model CF-HQ190L [id number]) scope was introduced                            through the  anus and advanced to the the cecum,                            identified by appendiceal orifice and ileocecal                            valve. The colonoscopy was performed without                            difficulty. The patient tolerated the procedure                            well. The quality of the bowel preparation was                            good. The bowel preparation used was Miralax. The                            ileocecal valve, appendiceal orifice, and rectum                            were photographed. Scope In: 11:47:12 AM Scope Out: 11:59:15 AM Scope Withdrawal Time: 0 hours 9 minutes 57 seconds  Total Procedure Duration: 0 hours 12 minutes 3 seconds  Findings:                 The perianal and digital rectal examinations were                            normal. Pertinent negatives include normal prostate                            (size, shape, and consistency).                           A 10 mm polyp was found in the descending colon.                            The polyp was sessile. The polyp was removed with a  cold snare. Resection and retrieval were complete.                            Verification of patient identification for the                            specimen was done. Estimated blood loss was minimal.                           Multiple small-mouthed diverticula were found in                            the right colon. There was no evidence of                            diverticular bleeding.                           The exam was otherwise without abnormality on                            direct and retroflexion views. Complications:            No immediate complications. Estimated Blood Loss:     Estimated blood loss was minimal. Impression:               - One 10 mm polyp in the descending colon, removed                            with a cold snare. Resected and retrieved.                           - Moderate  diverticulosis in the right colon. There                            was no evidence of diverticular bleeding.                           - The examination was otherwise normal on direct                            and retroflexion views.                           - Personal history of colonic polyps. 2005 advanced                            adenoma Recommendation:           - Patient has a contact number available for                            emergencies. The signs and symptoms of potential                            delayed complications were discussed with  the                            patient. Return to normal activities tomorrow.                            Written discharge instructions were provided to the                            patient.                           - Resume previous diet.                           - Continue present medications.                           - Resume aspirin at prior dose today.                           - Repeat colonoscopy [day] for surveillance based                            on pathology results. Gatha Mayer, MD 03/18/2016 12:03:41 PM This report has been signed electronically.

## 2016-03-18 NOTE — Progress Notes (Signed)
Called to room to assist during endoscopic procedure.  Patient ID and intended procedure confirmed with present staff. Received instructions for my participation in the procedure from the performing physician.  

## 2016-03-19 ENCOUNTER — Telehealth: Payer: Self-pay

## 2016-03-19 NOTE — Telephone Encounter (Signed)
  Follow up Call-  Call back number 03/18/2016  Post procedure Call Back phone  # [phone] hm  Permission to leave phone message Yes    Patient was called for follow up after his procedure on 03/18/2016. I spoke with the patients wife and she reports that [name] has returned to her normal daily activities without any difficulty.

## 2016-03-24 ENCOUNTER — Encounter: Payer: Self-pay | Admitting: Internal Medicine

## 2016-03-24 DIAGNOSIS — Z860101 Personal history of adenomatous and serrated colon polyps: Secondary | ICD-10-CM

## 2016-03-24 DIAGNOSIS — Z86010 Personal history of colonic polyps: Secondary | ICD-10-CM

## 2016-03-24 HISTORY — DX: Personal history of adenomatous and serrated colon polyps: Z86.0101

## 2016-03-24 HISTORY — DX: Personal history of colonic polyps: Z86.010

## 2016-03-24 NOTE — Progress Notes (Signed)
Quick Note:  10 mm adenoma Recall 2020 ______

## 2016-05-11 ENCOUNTER — Encounter: Payer: Self-pay | Admitting: Family Medicine

## 2016-05-11 ENCOUNTER — Ambulatory Visit (INDEPENDENT_AMBULATORY_CARE_PROVIDER_SITE_OTHER): Payer: Commercial Managed Care - HMO | Admitting: Family Medicine

## 2016-05-11 VITALS — BP 102/60 | HR 64 | Temp 97.3°F | Ht 69.5 in | Wt 169.0 lb

## 2016-05-11 DIAGNOSIS — I10 Essential (primary) hypertension: Secondary | ICD-10-CM

## 2016-05-11 MED ORDER — OLMESARTAN MEDOXOMIL-HCTZ 20-12.5 MG PO TABS: 1.0000 | ORAL_TABLET | Freq: Every day | ORAL | 1 refills | Status: DC

## 2016-05-11 NOTE — Progress Notes (Signed)
Subjective:  Patient ID: Russell [name], male    DOB: [birth year]  Age: 65 y.o. MRN: [id number]  CC: 6 month follow up (pt here today following up for hypertension and no other concerns at this time.)   HPI JAQWAN WIEBER presents for  follow-up of hypertension. Patient has no history of headache chest pain or shortness of breath or recent cough. Patient also denies symptoms of TIA such as numbness weakness lateralizing. Patient checks  blood pressure at home and has not had any elevated readings recently. Patient denies side effects from medication. States taking it regularly.   History Yoskar has a past medical history of adenomatous polyp of colon (03/24/2016); Hyperlipidemia; and Hypertension.   He has a past surgical history that includes Hernia repair; Fracture surgery (Left, age 81); Wisdom tooth extraction; Colonoscopy (2005); Upper gi endoscopy; and Upper gastrointestinal endoscopy.   His family history includes Diabetes in his mother; Heart disease in his mother; Hyperlipidemia in his father.He reports that he has been smoking Cigarettes.  He has a 56.25 pack-year smoking history. He has never used smokeless tobacco. He reports that he drinks about 4.2 oz of alcohol per week . He reports that he does not use drugs.  Current Outpatient Prescriptions on File Prior to Visit  Medication Sig Dispense Refill  . aspirin EC 81 MG tablet Take 81 mg by mouth every other day.     Marland Kitchen ULTRAVATE 0.05 % LOTN APPLY TO AFFECTED AREAS TWICE DAILY AS NEEDED  60   No current facility-administered medications on file prior to visit.     ROS Review of Systems  Constitutional: Negative for chills, diaphoresis, fever and unexpected weight change.  HENT: Negative for congestion, hearing loss, rhinorrhea and sore throat.   Eyes: Negative for visual disturbance.  Respiratory: Negative for cough and shortness of breath.   Cardiovascular: Negative for chest pain.  Gastrointestinal: Negative for abdominal  pain, constipation and diarrhea.  Genitourinary: Negative for dysuria and flank pain.  Musculoskeletal: Negative for arthralgias and joint swelling.  Skin: Negative for rash.  Neurological: Negative for dizziness and headaches.  Psychiatric/Behavioral: Negative for dysphoric mood and sleep disturbance.    Objective:  BP 102/60   Pulse 64   Temp 97.3 F (36.3 C) (Oral)   Ht 5' 9.5" (1.765 m)   Wt 169 lb (76.7 kg)   BMI 24.60 kg/m   BP Readings from Last 3 Encounters:  05/11/16 102/60  03/18/16 121/69  11/05/15 109/71    Wt Readings from Last 3 Encounters:  05/11/16 169 lb (76.7 kg)  03/18/16 166 lb (75.3 kg)  03/12/16 166 lb 12.8 oz (75.7 kg)     Physical Exam  Constitutional: He is oriented to person, place, and time. He appears well-developed and well-nourished. No distress.  HENT:  Head: Normocephalic and atraumatic.  Right Ear: External ear normal.  Left Ear: External ear normal.  Nose: Nose normal.  Mouth/Throat: Oropharynx is clear and moist.  Eyes: Conjunctivae and EOM are normal. Pupils are equal, round, and reactive to light.  Neck: Normal range of motion. Neck supple. No thyromegaly present.  Cardiovascular: Normal rate, regular rhythm and normal heart sounds.   No murmur heard. Pulmonary/Chest: Effort normal and breath sounds normal. No respiratory distress. He has no wheezes. He has no rales.  Abdominal: Soft. Bowel sounds are normal. He exhibits no distension. There is no tenderness.  Lymphadenopathy:    He has no cervical adenopathy.  Neurological: He is alert and oriented to person,  place, and time. He has normal reflexes.  Skin: Skin is warm and dry.  Psychiatric: He has a normal mood and affect. His behavior is normal. Judgment and thought content normal.     Lab Results  Component Value Date   WBC 9.9 11/08/2015   HGB 14.9 10/11/2014   HCT 43.0 11/08/2015   PLT 218 11/08/2015   GLUCOSE 101 (H) 11/08/2015   CHOL 179 11/08/2015   TRIG 45  11/08/2015   HDL 58 11/08/2015   LDLCALC 112 (H) 11/08/2015   ALT 14 11/08/2015   AST 17 11/08/2015   NA 137 11/08/2015   K 4.9 11/08/2015   CL 99 11/08/2015   CREATININE 0.88 11/08/2015   BUN 13 11/08/2015   CO2 24 11/08/2015   TSH 2.840 10/10/2013   PSA 0.5 10/11/2014    Ct Chest W Contrast  Result Date: 04/30/2010 Clinical Data: Possible right lung nodule.  No history of malignancy.  CT CHEST WITH CONTRAST  Technique:  Multidetector CT imaging of the chest was performed following the standard protocol during bolus administration of intravenous contrast.  Contrast: 75 ml Omnipaque-300 intravenously.  Comparison: [facility] chest radiographs 04/22/2010 (no report available).  Findings: There are no enlarged mediastinal or hilar lymph nodes. There is no pleural or pericardial effusion.  Coronary artery calcifications are noted.  There is no evidence of pulmonary nodule.  The right lung is clear. There is lingular atelectasis or scarring along the inferior aspect of the major fissure adjacent to the left heart border.  There is no confluent airspace opacity.  The visualized upper abdomen appears unremarkable.  IMPRESSION:  1.  No evidence of right basilar pulmonary nodule. 2.  Lingular scarring or atelectasis is probably longstanding and unchanged from recent radiographs.  Correlation with old films or radiographic followup would be helpful to document stability. 3.  No lymphadenopathy or pleural effusion. 4.  Coronary artery disease. Provider: [place]:   [place] was seen today for 6 month follow up.  Diagnoses and all orders for this visit:  Essential hypertension -     CMP14+EGFR  Other orders -     olmesartan-hydrochlorothiazide (BENICAR HCT) 20-12.5 MG tablet; Take 1 tablet by mouth daily.   I am having Mr. Atienza maintain his aspirin EC, ULTRAVATE, Fish Oil, Vitamin D, and olmesartan-hydrochlorothiazide.  Meds ordered this encounter    Medications  . Omega-3 Fatty Acids (FISH OIL) 1000 MG CAPS    Sig: Take 1,000 mg by mouth.  . Cholecalciferol (VITAMIN D) 2000 units CAPS    Sig: Take 2,000 Units by mouth daily.  Marland Kitchen olmesartan-hydrochlorothiazide (BENICAR HCT) 20-12.5 MG tablet    Sig: Take 1 tablet by mouth daily.    Dispense:  90 tablet    Refill:  1    Follow-up: Return in about 6 months (around 11/08/2016) for Wellness.  [name], M.D.

## 2016-05-12 LAB — CMP14+EGFR
ALK PHOS: 66 IU/L (ref 39–117)
ALT: 14 IU/L (ref 0–44)
AST: 14 IU/L (ref 0–40)
Albumin/Globulin Ratio: 1.9 (ref 1.2–2.2)
Albumin: 4.4 g/dL (ref 3.6–4.8)
BILIRUBIN TOTAL: 0.4 mg/dL (ref 0.0–1.2)
BUN/Creatinine Ratio: 16 (ref 10–24)
BUN: 19 mg/dL (ref 8–27)
CHLORIDE: 97 mmol/L (ref 96–106)
CO2: 26 mmol/L (ref 18–29)
CREATININE: 1.22 mg/dL (ref 0.76–1.27)
Calcium: 9.3 mg/dL (ref 8.6–10.2)
GFR calc Af Amer: 71 mL/min/{1.73_m2} (ref >59)
GFR calc non Af Amer: 62 mL/min/{1.73_m2} (ref >59)
GLUCOSE: 81 mg/dL (ref 65–99)
Globulin, Total: 2.3 g/dL (ref 1.5–4.5)
Potassium: 4.7 mmol/L (ref 3.5–5.2)
Sodium: 137 mmol/L (ref 134–144)
Total Protein: 6.7 g/dL (ref 6.0–8.5)

## 2016-11-12 ENCOUNTER — Ambulatory Visit (INDEPENDENT_AMBULATORY_CARE_PROVIDER_SITE_OTHER): Payer: Commercial Managed Care - HMO | Admitting: Family Medicine

## 2016-11-12 ENCOUNTER — Encounter: Payer: Self-pay | Admitting: Family Medicine

## 2016-11-12 VITALS — BP 124/61 | HR 68 | Temp 98.1°F | Ht 69.5 in | Wt 171.0 lb

## 2016-11-12 DIAGNOSIS — I10 Essential (primary) hypertension: Secondary | ICD-10-CM

## 2016-11-12 DIAGNOSIS — L2084 Intrinsic (allergic) eczema: Secondary | ICD-10-CM

## 2016-11-12 DIAGNOSIS — Z0000 Encounter for general adult medical examination without abnormal findings: Secondary | ICD-10-CM

## 2016-11-12 DIAGNOSIS — K1120 Sialoadenitis, unspecified: Secondary | ICD-10-CM

## 2016-11-12 DIAGNOSIS — Z86010 Personal history of colonic polyps: Secondary | ICD-10-CM

## 2016-11-12 DIAGNOSIS — Z125 Encounter for screening for malignant neoplasm of prostate: Secondary | ICD-10-CM

## 2016-11-12 MED ORDER — CETAPHIL MOISTURIZING EX LOTN: 1.0000 "application " | TOPICAL_LOTION | Freq: Two times a day (BID) | CUTANEOUS | 11 refills | Status: DC

## 2016-11-12 NOTE — Progress Notes (Signed)
Subjective:  Patient ID: Russell [name], male    DOB: Dec 18, 1950  Age: 66 y.o. MRN: [id number]  CC: Annual Exam (pt here today for CPE)   HPI NOHLAN BURDIN presents for Annual exam. He follows up regularly for hypertension. Continues to take olmesartan. No new concerns regarding that diagnosis. His 2 concerns today include swelling of the right jaw. This happens when he is eating. It's happened a few times over the last several months. He says he swells up instantly and looks like a chipmunk but it's just on the right side. It is a painless swelling. It sometimes goes down over half an hour or more but it blows up within just a couple minutes. He said he did some online research and it sounds like stones in the salivary ducts. He would like to see a specialist to see if this can be resolved permanently.  Patient also has problems with skin condition. Started a few years ago with a rash breaking out on his back. He has been to a dermatologist 2 or 3 times and they've given it some type of name but he's not sure what it is. He uses Ultravate with good success but it does seem to recur fairly frequently.   History Ashton has a past medical history of adenomatous polyp of colon (03/24/2016); Hyperlipidemia; and Hypertension.   He has a past surgical history that includes Hernia repair; Fracture surgery (Left, age 75); Wisdom tooth extraction; Colonoscopy (2005); Upper gi endoscopy; and Upper gastrointestinal endoscopy.   His family history includes Diabetes in his mother; Heart disease in his mother; Hyperlipidemia in his father.He reports that he has been smoking Cigarettes.  He has a 56.25 pack-year smoking history. He has never used smokeless tobacco. He reports that he drinks about 4.2 oz of alcohol per week . He reports that he does not use drugs.    ROS Review of Systems  Constitutional: Negative for chills, diaphoresis and fever.  HENT: Negative for rhinorrhea and sore throat.     Respiratory: Negative for cough and shortness of breath.   Cardiovascular: Negative for chest pain.  Gastrointestinal: Negative for abdominal pain.  Musculoskeletal: Negative for arthralgias and myalgias.  Skin: Negative for rash.  Neurological: Negative for weakness and headaches.    Objective:  BP 124/61   Pulse 68   Temp 98.1 F (36.7 C) (Oral)   Ht 5' 9.5" (1.765 m)   Wt 171 lb (77.6 kg)   BMI 24.89 kg/m   BP Readings from Last 3 Encounters:  11/12/16 124/61  05/11/16 102/60  03/18/16 121/69    Wt Readings from Last 3 Encounters:  11/12/16 171 lb (77.6 kg)  05/11/16 169 lb (76.7 kg)  03/18/16 166 lb (75.3 kg)     Physical Exam  Constitutional: He is oriented to person, place, and time. He appears well-developed and well-nourished.  HENT:  Head: Normocephalic and atraumatic.  Right Ear: Tympanic membrane normal.  Mouth/Throat: Oropharynx is clear and moist.  Right external auditory canal obscured by cerumen impaction. This was resolved with lavage.  Eyes: EOM are normal. Pupils are equal, round, and reactive to light.  Neck: Normal range of motion. No tracheal deviation present. No thyromegaly present.  Cardiovascular: Normal rate, regular rhythm and normal heart sounds.  Exam reveals no gallop and no friction rub.   No murmur heard. Pulmonary/Chest: Breath sounds normal. He has no wheezes. He has no rales.  Abdominal: Soft. He exhibits no mass. There is no tenderness.  Musculoskeletal: Normal range of motion. He exhibits no edema.  Neurological: He is alert and oriented to person, place, and time.  Skin: Skin is warm and dry.  Multiple excoriations on the upper back each it is crusted and measures 3-4 mm each. No signs of infection or lesion. The right forearm at the ventral surface has a 3 cm eruption which is papular erythematous with a thin peripheral scale. He states this lesion is representative of the ongoing intermittent eruptions.  Psychiatric: He has a  normal mood and affect.      Assessment & Plan:   Zymarion was seen today for annual exam.  Diagnoses and all orders for this visit:  Sialoadenitis -     Ambulatory referral to ENT  Well adult exam -     CBC with Differential/Platelet -     CMP14+EGFR -     Lipid panel -     Urinalysis  Essential hypertension -     CMP14+EGFR -     Lipid panel  History of colonic polyps  Intrinsic eczema  Screening for prostate cancer -     PSA Total (Reflex To Free)       I am having Mr. Pietsch maintain his aspirin EC, ULTRAVATE, Fish Oil, Vitamin D, and olmesartan-hydrochlorothiazide.  Allergies as of 11/12/2016   No Known Allergies     Medication List       Accurate as of 11/12/16  2:54 PM. Always use your most recent med list.          aspirin EC 81 MG tablet Take 81 mg by mouth every other day.   Fish Oil 1000 MG Caps Take 1,000 mg by mouth.   olmesartan-hydrochlorothiazide 20-12.5 MG tablet Commonly known as:  BENICAR HCT Take 1 tablet by mouth daily.   ULTRAVATE 0.05 % Lotn Generic drug:  Halobetasol Propionate APPLY TO AFFECTED AREAS TWICE DAILY AS NEEDED   Vitamin D 2000 units Caps Take 2,000 Units by mouth daily.        Follow-up: No Follow-up on file.  [name], M.D.

## 2016-11-13 ENCOUNTER — Other Ambulatory Visit: Payer: Self-pay | Admitting: Physician Assistant

## 2016-11-13 ENCOUNTER — Other Ambulatory Visit (INDEPENDENT_AMBULATORY_CARE_PROVIDER_SITE_OTHER): Payer: Commercial Managed Care - HMO

## 2016-11-13 DIAGNOSIS — Z0000 Encounter for general adult medical examination without abnormal findings: Secondary | ICD-10-CM

## 2016-11-13 DIAGNOSIS — I10 Essential (primary) hypertension: Secondary | ICD-10-CM

## 2016-11-13 DIAGNOSIS — Z125 Encounter for screening for malignant neoplasm of prostate: Secondary | ICD-10-CM

## 2016-11-14 LAB — CBC WITH DIFFERENTIAL/PLATELET
BASOS: 1 %
Basophils Absolute: 0.1 10*3/uL (ref 0.0–0.2)
EOS (ABSOLUTE): 0.4 10*3/uL (ref 0.0–0.4)
Eos: 5 %
HEMATOCRIT: 47.5 % (ref 37.5–51.0)
HEMOGLOBIN: 16.2 g/dL (ref 13.0–17.7)
IMMATURE GRANS (ABS): 0 10*3/uL (ref 0.0–0.1)
Immature Granulocytes: 0 %
LYMPHS ABS: 2.4 10*3/uL (ref 0.7–3.1)
Lymphs: 29 %
MCH: 33.2 pg — AB (ref 26.6–33.0)
MCHC: 34.1 g/dL (ref 31.5–35.7)
MCV: 97 fL (ref 79–97)
Monocytes Absolute: 0.6 10*3/uL (ref 0.1–0.9)
Monocytes: 7 %
NEUTROS ABS: 4.8 10*3/uL (ref 1.4–7.0)
Neutrophils: 58 %
Platelets: 222 10*3/uL (ref 150–379)
RBC: 4.88 x10E6/uL (ref 4.14–5.80)
RDW: 13.8 % (ref 12.3–15.4)
WBC: 8.3 10*3/uL (ref 3.4–10.8)

## 2016-11-14 LAB — CMP14+EGFR
ALK PHOS: 66 IU/L (ref 39–117)
ALT: 16 IU/L (ref 0–44)
AST: 15 IU/L (ref 0–40)
Albumin/Globulin Ratio: 2 (ref 1.2–2.2)
Albumin: 4.3 g/dL (ref 3.6–4.8)
BUN/Creatinine Ratio: 19 (ref 10–24)
BUN: 17 mg/dL (ref 8–27)
Bilirubin Total: 0.2 mg/dL (ref 0.0–1.2)
CO2: 26 mmol/L (ref 18–29)
CREATININE: 0.9 mg/dL (ref 0.76–1.27)
Calcium: 9 mg/dL (ref 8.6–10.2)
Chloride: 102 mmol/L (ref 96–106)
GFR calc Af Amer: 103 mL/min/{1.73_m2} (ref >59)
GFR, EST NON AFRICAN AMERICAN: 89 mL/min/{1.73_m2} (ref >59)
GLOBULIN, TOTAL: 2.1 g/dL (ref 1.5–4.5)
Glucose: 101 mg/dL — ABNORMAL HIGH (ref 65–99)
POTASSIUM: 4.8 mmol/L (ref 3.5–5.2)
SODIUM: 141 mmol/L (ref 134–144)
Total Protein: 6.4 g/dL (ref 6.0–8.5)

## 2016-11-14 LAB — LIPID PANEL
CHOL/HDL RATIO: 3.1 ratio (ref 0.0–5.0)
Cholesterol, Total: 182 mg/dL (ref 100–199)
HDL: 59 mg/dL (ref >39)
LDL Calculated: 111 mg/dL — ABNORMAL HIGH (ref 0–99)
TRIGLYCERIDES: 58 mg/dL (ref 0–149)
VLDL Cholesterol Cal: 12 mg/dL (ref 5–40)

## 2016-11-14 LAB — PSA TOTAL (REFLEX TO FREE): Prostate Specific Ag, Serum: 0.6 ng/mL (ref 0.0–4.0)

## 2016-11-15 LAB — URINALYSIS
BILIRUBIN UA: NEGATIVE
GLUCOSE, UA: NEGATIVE
Ketones, UA: NEGATIVE
Leukocytes, UA: NEGATIVE
Nitrite, UA: NEGATIVE
RBC, UA: NEGATIVE
Specific Gravity, UA: 1.015 (ref 1.005–1.030)
Urobilinogen, Ur: 0.2 mg/dL (ref 0.2–1.0)
pH, UA: 7 (ref 5.0–7.5)

## 2016-11-22 ENCOUNTER — Telehealth: Payer: Self-pay | Admitting: Family Medicine

## 2016-12-23 ENCOUNTER — Ambulatory Visit (INDEPENDENT_AMBULATORY_CARE_PROVIDER_SITE_OTHER): Payer: Commercial Managed Care - HMO | Admitting: Otolaryngology

## 2016-12-23 DIAGNOSIS — K1123 Chronic sialoadenitis: Secondary | ICD-10-CM

## 2017-04-10 ENCOUNTER — Other Ambulatory Visit: Payer: Self-pay | Admitting: Family Medicine

## 2017-05-17 ENCOUNTER — Encounter: Payer: Self-pay | Admitting: Family Medicine

## 2017-05-17 ENCOUNTER — Ambulatory Visit (INDEPENDENT_AMBULATORY_CARE_PROVIDER_SITE_OTHER): Payer: 59 | Admitting: Family Medicine

## 2017-05-17 VITALS — BP 109/61 | HR 69 | Ht 69.5 in | Wt 165.0 lb

## 2017-05-17 DIAGNOSIS — L409 Psoriasis, unspecified: Secondary | ICD-10-CM

## 2017-05-17 DIAGNOSIS — I10 Essential (primary) hypertension: Secondary | ICD-10-CM

## 2017-05-17 MED ORDER — HALOBETASOL PROPIONATE 0.05 % EX CREA: TOPICAL_CREAM | Freq: Two times a day (BID) | CUTANEOUS | 0 refills | Status: DC

## 2017-05-17 MED ORDER — OLMESARTAN MEDOXOMIL-HCTZ 20-12.5 MG PO TABS: 1.0000 | ORAL_TABLET | Freq: Every day | ORAL | 1 refills | Status: DC

## 2017-05-17 NOTE — Progress Notes (Signed)
Subjective:  Patient ID: Russell [name], male    DOB: Oct 02, 1950  Age: 66 y.o. MRN: [id number]  CC: Hypertension (pt here today for routine follow up of his chronic medical conditions)   HPI Russell [name] presents for  follow-up of hypertension. Patient has no history of headache chest pain or shortness of breath or recent cough. Patient also denies symptoms of TIA such as focal numbness or weakness. Patient denies side effects from medication. States taking it regularly.Psoriasis is been acting up and he is getting more of it on his hand. He wonders if he continues the Ultravate on that.   History Russell [name] has a past medical history of adenomatous polyp of colon (03/24/2016); Hyperlipidemia; and Hypertension.   He has a past surgical history that includes Hernia repair; Fracture surgery (Left, age 89); Wisdom tooth extraction; Colonoscopy (2005); Upper gi endoscopy; and Upper gastrointestinal endoscopy.   His family history includes Diabetes in his mother; Heart disease in his mother; Hyperlipidemia in his father.He reports that he has been smoking Cigarettes.  He has a 56.25 pack-year smoking history. He has never used smokeless tobacco. He reports that he drinks about 4.2 oz of alcohol per week . He reports that he does not use drugs.  Current Outpatient Prescriptions on File Prior to Visit  Medication Sig Dispense Refill  . aspirin EC 81 MG tablet Take 81 mg by mouth every other day.     . Cholecalciferol (VITAMIN D) 2000 units CAPS Take 2,000 Units by mouth daily.    . Omega-3 Fatty Acids (FISH OIL) 1000 MG CAPS Take 1,000 mg by mouth.     No current facility-administered medications on file prior to visit.     ROS Review of Systems  Constitutional: Negative for chills, diaphoresis, fever and unexpected weight change.  HENT: Negative for congestion, hearing loss, rhinorrhea and sore throat.   Eyes: Negative for visual disturbance.  Respiratory: Negative for cough and shortness of  breath.   Cardiovascular: Negative for chest pain.  Gastrointestinal: Negative for abdominal pain, constipation and diarrhea.  Genitourinary: Negative for dysuria and flank pain.  Musculoskeletal: Negative for arthralgias and joint swelling.  Skin: Negative for rash.  Neurological: Negative for dizziness and headaches.  Psychiatric/Behavioral: Negative for dysphoric mood and sleep disturbance.    Objective:  BP 109/61   Pulse 69   Ht 5' 9.5" (1.765 m)   Wt 165 lb (74.8 kg)   BMI 24.02 kg/m   BP Readings from Last 3 Encounters:  05/17/17 109/61  11/12/16 124/61  05/11/16 102/60    Wt Readings from Last 3 Encounters:  05/17/17 165 lb (74.8 kg)  11/12/16 171 lb (77.6 kg)  05/11/16 169 lb (76.7 kg)     Physical Exam  Constitutional: He is oriented to person, place, and time. He appears well-developed and well-nourished. No distress.  HENT:  Head: Normocephalic and atraumatic.  Right Ear: External ear normal.  Left Ear: External ear normal.  Nose: Nose normal.  Mouth/Throat: Oropharynx is clear and moist.  Eyes: Pupils are equal, round, and reactive to light. Conjunctivae and EOM are normal.  Neck: Normal range of motion. Neck supple. No thyromegaly present.  Cardiovascular: Normal rate, regular rhythm and normal heart sounds.   No murmur heard. Pulmonary/Chest: Effort normal and breath sounds normal. No respiratory distress. He has no wheezes. He has no rales.  Abdominal: Soft. Bowel sounds are normal. He exhibits no distension. There is no tenderness.  Lymphadenopathy:    He has no  cervical adenopathy.  Neurological: He is alert and oriented to person, place, and time. He has normal reflexes.  Skin: Skin is warm and dry.  Psychiatric: He has a normal mood and affect. His behavior is normal. Judgment and thought content normal.      Assessment & Plan:   Russell [name] was seen today for hypertension.  Diagnoses and all orders for this visit:  Essential hypertension -      BMP8+EGFR  Psoriasis  Other orders -     olmesartan-hydrochlorothiazide (BENICAR HCT) 20-12.5 MG tablet; Take 1 tablet by mouth daily. -     halobetasol (ULTRAVATE) 0.05 % cream; Apply topically 2 (two) times daily.   Allergies as of 05/17/2017   No Known Allergies     Medication List       Accurate as of 05/17/17  7:33 PM. Always use your most recent med list.          aspirin EC 81 MG tablet Take 81 mg by mouth every other day.   Fish Oil 1000 MG Caps Take 1,000 mg by mouth.   halobetasol 0.05 % cream Commonly known as:  ULTRAVATE Apply topically 2 (two) times daily.   olmesartan-hydrochlorothiazide 20-12.5 MG tablet Commonly known as:  BENICAR HCT Take 1 tablet by mouth daily.   Vitamin D 2000 units Caps Take 2,000 Units by mouth daily.            Discharge Care Instructions        Start     Ordered   05/17/17 0000  olmesartan-hydrochlorothiazide (BENICAR HCT) 20-12.5 MG tablet  Daily     05/17/17 1656   05/17/17 0000  halobetasol (ULTRAVATE) 0.05 % cream  2 times daily     05/17/17 1656   05/17/17 0000  BMP8+EGFR     05/17/17 1657      Meds ordered this encounter  Medications  . olmesartan-hydrochlorothiazide (BENICAR HCT) 20-12.5 MG tablet    Sig: Take 1 tablet by mouth daily.    Dispense:  90 tablet    Refill:  1  . halobetasol (ULTRAVATE) 0.05 % cream    Sig: Apply topically 2 (two) times daily.    Dispense:  50 g    Refill:  0      Follow-up: Return in about 6 months (around 11/14/2017) for Wellness.  [name], M.D.

## 2017-05-18 LAB — BMP8+EGFR
BUN / CREAT RATIO: 18 (ref 10–24)
BUN: 18 mg/dL (ref 8–27)
CALCIUM: 9.3 mg/dL (ref 8.6–10.2)
CHLORIDE: 102 mmol/L (ref 96–106)
CO2: 25 mmol/L (ref 20–29)
Creatinine, Ser: 1 mg/dL (ref 0.76–1.27)
GFR, EST AFRICAN AMERICAN: 90 mL/min/{1.73_m2} (ref >59)
GFR, EST NON AFRICAN AMERICAN: 78 mL/min/{1.73_m2} (ref >59)
Glucose: 83 mg/dL (ref 65–99)
POTASSIUM: 4.5 mmol/L (ref 3.5–5.2)
Sodium: 140 mmol/L (ref 134–144)

## 2017-10-31 ENCOUNTER — Other Ambulatory Visit: Payer: Self-pay | Admitting: Family Medicine

## 2017-10-31 MED ORDER — OLMESARTAN MEDOXOMIL-HCTZ 20-12.5 MG PO TABS: 1.0000 | ORAL_TABLET | Freq: Every day | ORAL | 0 refills | Status: DC

## 2017-10-31 NOTE — Telephone Encounter (Signed)
Pt aware refill sent [facility]

## 2017-11-15 ENCOUNTER — Encounter: Payer: Self-pay | Admitting: Family Medicine

## 2017-11-15 ENCOUNTER — Ambulatory Visit (INDEPENDENT_AMBULATORY_CARE_PROVIDER_SITE_OTHER): Payer: Medicare HMO | Admitting: Family Medicine

## 2017-11-15 VITALS — BP 108/63 | HR 65 | Temp 97.9°F | Ht 69.5 in | Wt 163.5 lb

## 2017-11-15 DIAGNOSIS — J020 Streptococcal pharyngitis: Secondary | ICD-10-CM | POA: Diagnosis not present

## 2017-11-15 DIAGNOSIS — I499 Cardiac arrhythmia, unspecified: Secondary | ICD-10-CM

## 2017-11-15 DIAGNOSIS — Z0000 Encounter for general adult medical examination without abnormal findings: Secondary | ICD-10-CM | POA: Diagnosis not present

## 2017-11-15 DIAGNOSIS — J029 Acute pharyngitis, unspecified: Secondary | ICD-10-CM | POA: Diagnosis not present

## 2017-11-15 LAB — RAPID STREP SCREEN (MED CTR MEBANE ONLY): STREP GP A AG, IA W/REFLEX: POSITIVE — AB

## 2017-11-15 MED ORDER — OLMESARTAN MEDOXOMIL-HCTZ 20-12.5 MG PO TABS: 1.0000 | ORAL_TABLET | Freq: Every day | ORAL | 1 refills | Status: DC

## 2017-11-15 MED ORDER — AMOXICILLIN-POT CLAVULANATE 875-125 MG PO TABS: 1.0000 | ORAL_TABLET | Freq: Two times a day (BID) | ORAL | 0 refills | Status: DC

## 2017-11-15 NOTE — Progress Notes (Signed)
Subjective:  Patient ID: Russell [name], male    DOB: 1951/06/06  Age: 67 y.o. MRN: [id number]  CC: Annual Exam   HPI Russell [name] presents for complete physical exam.  He is also being followed for high blood pressure.  He does not check pressure at home but he has been taking olmesartan hydrochlorothiazide diligently and daily.  Patient relates that she is due for colonoscopy in July of next year based on polyps found on a colonoscopy 1-1/2 years ago.  Depression screen [facility] 2/9 11/15/2017 05/17/2017 11/12/2016  Decreased Interest 0 0 0  Down, Depressed, Hopeless 0 0 0  PHQ - 2 Score 0 0 0    History Russell [name] has a past medical history of adenomatous polyp of colon (03/24/2016), Hyperlipidemia, and Hypertension.   He has a past surgical history that includes Hernia repair; Fracture surgery (Left, age 84); Wisdom tooth extraction; Colonoscopy (2005); Upper gi endoscopy; and Upper gastrointestinal endoscopy.   His family history includes Diabetes in his mother; Heart disease in his mother; Hyperlipidemia in his father.He reports that he has been smoking cigarettes.  He has a 56.25 pack-year smoking history. he has never used smokeless tobacco. He reports that he drinks about 4.2 oz of alcohol per week. He reports that he does not use drugs.    ROS Review of Systems  Constitutional: Negative for activity change, appetite change, chills, diaphoresis, fatigue, fever and unexpected weight change.  HENT: Positive for sore throat (Onset of acute sore throat 2-3 hours before presentation today.  No known exposure.). Negative for congestion, ear pain, hearing loss, postnasal drip, rhinorrhea, tinnitus and trouble swallowing.   Eyes: Negative for photophobia, pain, discharge and redness.  Respiratory: Negative for apnea, cough, choking, chest tightness, shortness of breath, wheezing and stridor.   Cardiovascular: Negative for chest pain, palpitations and leg swelling.  Gastrointestinal: Negative for  abdominal distention, abdominal pain, blood in stool, constipation, diarrhea, nausea and vomiting.  Endocrine: Negative for cold intolerance, heat intolerance, polydipsia, polyphagia and polyuria.  Genitourinary: Negative for difficulty urinating, dysuria, enuresis, flank pain, frequency, genital sores, hematuria and urgency.  Musculoskeletal: Negative for arthralgias and joint swelling.  Skin: Negative for color change, rash and wound.  Allergic/Immunologic: Negative for immunocompromised state.  Neurological: Negative for dizziness, tremors, seizures, syncope, facial asymmetry, speech difficulty, weakness, light-headedness, numbness and headaches.  Hematological: Does not bruise/bleed easily.  Psychiatric/Behavioral: Negative for agitation, behavioral problems, confusion, decreased concentration, dysphoric mood, hallucinations, sleep disturbance and suicidal ideas. The patient is not nervous/anxious and is not hyperactive.     Objective:  BP 108/63   Pulse 65   Temp 97.9 F (36.6 C) (Oral)   Ht 5' 9.5" (1.765 m)   Wt 163 lb 8 oz (74.2 kg)   BMI 23.80 kg/m   BP Readings from Last 3 Encounters:  11/15/17 108/63  05/17/17 109/61  11/12/16 124/61    Wt Readings from Last 3 Encounters:  11/15/17 163 lb 8 oz (74.2 kg)  05/17/17 165 lb (74.8 kg)  11/12/16 171 lb (77.6 kg)     Physical Exam  Constitutional: He is oriented to person, place, and time. He appears well-developed and well-nourished.  HENT:  Head: Normocephalic and atraumatic.  Mouth/Throat: Oropharynx is clear and moist. No oropharyngeal exudate (However there is significant erythema and edema of the posterior pharynx and uvula.).  Eyes: EOM are normal. Pupils are equal, round, and reactive to light.  Neck: Normal range of motion. No tracheal deviation present. No thyromegaly present.  Cardiovascular: Normal heart sounds and intact distal pulses. An irregular rhythm present. Frequent extrasystoles are present.  Bradycardia present. Exam reveals no gallop and no friction rub.  No murmur heard. Pulses:      Carotid pulses are 2+ on the right side, and 2+ on the left side.      Radial pulses are 2+ on the right side, and 2+ on the left side.       Dorsalis pedis pulses are 2+ on the right side, and 2+ on the left side.  Pulmonary/Chest: Breath sounds normal. He has no wheezes. He has no rales.  Abdominal: Soft. He exhibits no mass. There is no tenderness.  Musculoskeletal: Normal range of motion. He exhibits no edema.  Neurological: He is alert and oriented to person, place, and time.  Skin: Skin is warm and dry.  Psychiatric: He has a normal mood and affect. His behavior is normal. Judgment and thought content normal.      Assessment & Plan:   Russell [name] was seen today for annual exam.  Diagnoses and all orders for this visit:  Well adult exam -     CBC with Differential/Platelet -     CMP14+EGFR -     Lipid panel -     PSA, total and free -     VITAMIN D 25 Hydroxy (Vit-D Deficiency, Fractures) -     Urinalysis  Irregular cardiac rhythm -     EKG 12-Lead  Sore throat -     Rapid Strep Screen (Not at [facility])  Pharyngitis, streptococcal, acute  Other orders -     olmesartan-hydrochlorothiazide (BENICAR HCT) 20-12.5 MG tablet; Take 1 tablet by mouth daily. -     amoxicillin-clavulanate (AUGMENTIN) 875-125 MG tablet; Take 1 tablet by mouth 2 (two) times daily.       I have discontinued Russell [name]'s halobetasol. I am also having him start on amoxicillin-clavulanate. Additionally, I am having him maintain his aspirin EC, Fish Oil, Vitamin D, and olmesartan-hydrochlorothiazide.  Allergies as of 11/15/2017   No Known Allergies     Medication List        Accurate as of 11/15/17  5:25 PM. Always use your most recent med list.          amoxicillin-clavulanate 875-125 MG tablet Commonly known as:  AUGMENTIN Take 1 tablet by mouth 2 (two) times daily.   aspirin EC 81 MG  tablet Take 81 mg by mouth every other day.   Fish Oil 1000 MG Caps Take 1,000 mg by mouth.   olmesartan-hydrochlorothiazide 20-12.5 MG tablet Commonly known as:  BENICAR HCT Take 1 tablet by mouth daily.   Vitamin D 2000 units Caps Take 2,000 Units by mouth daily.        Follow-up: Return in about 6 months (around 05/18/2018), or if symptoms worsen or fail to improve.  [name], M.D.

## 2017-11-16 ENCOUNTER — Other Ambulatory Visit: Payer: Medicare HMO

## 2017-11-16 DIAGNOSIS — I10 Essential (primary) hypertension: Secondary | ICD-10-CM | POA: Diagnosis not present

## 2017-11-16 DIAGNOSIS — Z0000 Encounter for general adult medical examination without abnormal findings: Secondary | ICD-10-CM | POA: Diagnosis not present

## 2017-11-16 DIAGNOSIS — Z125 Encounter for screening for malignant neoplasm of prostate: Secondary | ICD-10-CM | POA: Diagnosis not present

## 2017-11-16 LAB — URINALYSIS
Bilirubin, UA: NEGATIVE
GLUCOSE, UA: NEGATIVE
KETONES UA: NEGATIVE
LEUKOCYTES UA: NEGATIVE
NITRITE UA: NEGATIVE
PROTEIN UA: NEGATIVE
RBC UA: NEGATIVE
Urobilinogen, Ur: 0.2 mg/dL (ref 0.2–1.0)
pH, UA: 7 (ref 5.0–7.5)

## 2017-11-17 LAB — CMP14+EGFR
ALBUMIN: 4.4 g/dL (ref 3.6–4.8)
ALK PHOS: 65 IU/L (ref 39–117)
ALT: 12 IU/L (ref 0–44)
AST: 15 IU/L (ref 0–40)
Albumin/Globulin Ratio: 1.8 (ref 1.2–2.2)
BILIRUBIN TOTAL: 0.6 mg/dL (ref 0.0–1.2)
BUN / CREAT RATIO: 13 (ref 10–24)
BUN: 14 mg/dL (ref 8–27)
CHLORIDE: 98 mmol/L (ref 96–106)
CO2: 24 mmol/L (ref 20–29)
Calcium: 9.1 mg/dL (ref 8.6–10.2)
Creatinine, Ser: 1.11 mg/dL (ref 0.76–1.27)
GFR calc Af Amer: 79 mL/min/{1.73_m2} (ref >59)
GFR calc non Af Amer: 68 mL/min/{1.73_m2} (ref >59)
GLUCOSE: 98 mg/dL (ref 65–99)
Globulin, Total: 2.4 g/dL (ref 1.5–4.5)
Potassium: 4.5 mmol/L (ref 3.5–5.2)
Sodium: 137 mmol/L (ref 134–144)
Total Protein: 6.8 g/dL (ref 6.0–8.5)

## 2017-11-17 LAB — CBC WITH DIFFERENTIAL/PLATELET
BASOS ABS: 0.1 10*3/uL (ref 0.0–0.2)
Basos: 1 %
EOS (ABSOLUTE): 0.3 10*3/uL (ref 0.0–0.4)
Eos: 3 %
HEMOGLOBIN: 15.6 g/dL (ref 13.0–17.7)
Hematocrit: 45.1 % (ref 37.5–51.0)
Immature Grans (Abs): 0 10*3/uL (ref 0.0–0.1)
Immature Granulocytes: 0 %
LYMPHS ABS: 1.1 10*3/uL (ref 0.7–3.1)
Lymphs: 12 %
MCH: 32.6 pg (ref 26.6–33.0)
MCHC: 34.6 g/dL (ref 31.5–35.7)
MCV: 94 fL (ref 79–97)
MONOS ABS: 0.7 10*3/uL (ref 0.1–0.9)
Monocytes: 8 %
NEUTROS ABS: 7.3 10*3/uL — AB (ref 1.4–7.0)
Neutrophils: 76 %
PLATELETS: 184 10*3/uL (ref 150–379)
RBC: 4.78 x10E6/uL (ref 4.14–5.80)
RDW: 13.8 % (ref 12.3–15.4)
WBC: 9.5 10*3/uL (ref 3.4–10.8)

## 2017-11-17 LAB — LIPID PANEL
CHOLESTEROL TOTAL: 190 mg/dL (ref 100–199)
Chol/HDL Ratio: 2.7 ratio (ref 0.0–5.0)
HDL: 71 mg/dL (ref >39)
LDL Calculated: 109 mg/dL — ABNORMAL HIGH (ref 0–99)
Triglycerides: 52 mg/dL (ref 0–149)
VLDL CHOLESTEROL CAL: 10 mg/dL (ref 5–40)

## 2017-11-17 LAB — PSA, TOTAL AND FREE
PSA FREE PCT: 30 %
PSA FREE: 0.18 ng/mL
Prostate Specific Ag, Serum: 0.6 ng/mL (ref 0.0–4.0)

## 2017-11-17 LAB — VITAMIN D 25 HYDROXY (VIT D DEFICIENCY, FRACTURES): VIT D 25 HYDROXY: 52.9 ng/mL (ref 30.0–100.0)

## 2017-11-20 ENCOUNTER — Encounter: Payer: Self-pay | Admitting: Family Medicine

## 2018-02-16 ENCOUNTER — Encounter: Payer: Self-pay | Admitting: Family

## 2018-02-16 ENCOUNTER — Ambulatory Visit (INDEPENDENT_AMBULATORY_CARE_PROVIDER_SITE_OTHER): Payer: Medicare HMO | Admitting: Family

## 2018-02-16 VITALS — BP 122/69 | HR 74 | Temp 98.2°F | Ht 69.5 in | Wt 157.8 lb

## 2018-02-16 DIAGNOSIS — R739 Hyperglycemia, unspecified: Secondary | ICD-10-CM

## 2018-02-16 DIAGNOSIS — M25469 Effusion, unspecified knee: Secondary | ICD-10-CM

## 2018-02-16 DIAGNOSIS — W57XXXA Bitten or stung by nonvenomous insect and other nonvenomous arthropods, initial encounter: Secondary | ICD-10-CM | POA: Diagnosis not present

## 2018-02-16 DIAGNOSIS — M7122 Synovial cyst of popliteal space [Baker], left knee: Secondary | ICD-10-CM

## 2018-02-16 DIAGNOSIS — S30861A Insect bite (nonvenomous) of abdominal wall, initial encounter: Secondary | ICD-10-CM | POA: Diagnosis not present

## 2018-02-16 NOTE — Patient Instructions (Signed)
Baker Cyst A Baker cyst, also called a popliteal cyst, is a sac-like growth that forms at the back of the knee. The cyst forms when the fluid-filled sac (bursa) that cushions the knee joint becomes enlarged. The bursa that becomes a Baker cyst is located at the back of the knee joint. What are the causes? In most cases, a Baker cyst results from another knee problem that causes swelling inside the knee. This makes the fluid inside the knee joint (synovial fluid) flow into the bursa behind the knee, causing the bursa to enlarge. What increases the risk? You may be more likely to develop a Baker cyst if you already have a knee problem, such as:  A tear in cartilage that cushions the knee joint (meniscal tear).  A tear in the tissues that connect the bones of the knee joint (ligament tear).  Knee swelling from osteoarthritis, rheumatoid arthritis, or gout.  What are the signs or symptoms? A Baker cyst does not always cause symptoms. A lump behind the knee may be the only sign of the condition. The lump may be painful, especially when the knee is straightened. If the lump is painful, the pain may come and go. The knee may also be stiff. Symptoms may quickly get more severe if the cyst breaks open (ruptures). If your cyst ruptures, signs and symptoms may affect the knee and the back of the lower leg (calf) and may include:  Sudden or worsening pain.  Swelling.  Bruising.  How is this diagnosed? This condition may be diagnosed based on your symptoms and medical history. Your health care provider will also do a physical exam. This may include:  Feeling the cyst to check whether it is tender.  Checking your knee for signs of another knee condition that causes swelling.  You may have imaging tests, such as:  X-rays.  MRI.  Ultrasound.  How is this treated? A Baker cyst that is not painful may go away without treatment. If the cyst gets large or painful, it will likely get better if the  underlying knee problem is treated. Treatment for a Baker cyst may include:  Resting.  Keeping weight off of the knee. This means not leaning on the knee to support your body weight.  NSAIDs to reduce pain and swelling.  A procedure to drain the fluid from the cyst with a needle (aspiration). You may also get an injection of a medicine that reduces swelling (steroid).  Surgery. This may be needed if other treatments do not work. This usually involves correcting knee damage and removing the cyst.  Follow these instructions at home:  Take over-the-counter and prescription medicines only as told by your health care provider.  Rest and return to your normal activities as told by your health care provider. Avoid activities that make pain or swelling worse. Ask your health care provider what activities are safe for you.  Keep all follow-up visits as told by your health care provider. This is important. Contact a health care provider if:  You have knee pain, stiffness, or swelling that does not get better. Get help right away if:  You have sudden or worsening pain and swelling in your calf area. This information is not intended to replace advice given to you by your health care provider. Make sure you discuss any questions you have with your health care provider. Document Released: 08/23/2005 Document Revised: 05/13/2016 Document Reviewed: 05/13/2016 Elsevier Interactive Patient Education  2018 [employer]  

## 2018-02-16 NOTE — Progress Notes (Signed)
   Subjective:    Patient ID: Russell [name], male    DOB: 06-Feb-1951, 67 y.o.   MRN: [id number]  Chief Complaint  Patient presents with  . swollen left knee    has had tick bites this year    HPI Pt presents to the office today with left posterior knee swelling that he noticed about a week ago. States the area is unchanged. Denies any pain, itching, fever, SOB,  or redness. Denies any hx of DVT.   States he had surgery on this left knee when he was 14 years and has had "fluid removed in the past".   States he has multiple tick bites this year and has removed several ticks from his lower abdomin in the last three weeks.    Review of Systems  Cardiovascular: Leg swelling: left knee.  All other systems reviewed and are negative.      Objective:   Physical Exam  Constitutional: He is oriented to person, place, and time. He appears well-developed and well-nourished. No distress.  HENT:  Head: Normocephalic.  Eyes: Pupils are equal, round, and reactive to light. Right eye exhibits no discharge. Left eye exhibits no discharge.  Neck: Normal range of motion. Neck supple. No thyromegaly present.  Cardiovascular: Normal rate, regular rhythm, normal heart sounds and intact distal pulses.  No murmur heard. Pulmonary/Chest: Effort normal and breath sounds normal. No respiratory distress. He has no wheezes.  Abdominal: Soft. Bowel sounds are normal. He exhibits no distension. There is no tenderness.  Musculoskeletal: Normal range of motion. He exhibits no edema or tenderness.  Full ROM, fluid fill mass on posterior left knee  Neurological: He is alert and oriented to person, place, and time. He has normal reflexes. No cranial nerve deficit.  Skin: Skin is warm and dry. No rash noted. No erythema.  Psychiatric: He has a normal mood and affect. His behavior is normal. Judgment and thought content normal.  Vitals reviewed.     BP 122/69   Pulse 74   Temp 98.2 F (36.8 C) (Oral)   Ht 5'  9.5" (1.765 m)   Wt 157 lb 12.8 oz (71.6 kg)   BMI 22.97 kg/m      Assessment & Plan:  Russell [name] comes in today with chief complaint of swollen left knee (has had tick bites this year)   Diagnosis and orders addressed:  1. Baker's cyst of knee, left I believe this is a bakers cyst, will do Ortho referral  Will do doppler to confirm  - BMP8+EGFR - US Venous Img Lower Unilateral Right; Future - Ambulatory referral to Orthopedic Surgery  2. Tick bite of abdomen, initial encounter -Pt to report any new fever, joint pain, or rash -Wear protective clothing while outside- Long sleeves and long pants -Put insect repellent on all exposed skin and along clothing -Take a shower as soon as possible after being outsid - BMP8+EGFR - [place] spotted fvr abs pnl(IgG+IgM) - Lyme Ab/Western Blot Reflex  3. Swelling of knee  - BMP8+EGFR - US Venous Img Lower Unilateral Right; Future      Evelina Dun, FNP

## 2018-02-18 LAB — BMP8+EGFR
BUN/Creatinine Ratio: 17 (ref 10–24)
BUN: 15 mg/dL (ref 8–27)
CO2: 25 mmol/L (ref 20–29)
CREATININE: 0.86 mg/dL (ref 0.76–1.27)
Calcium: 9.1 mg/dL (ref 8.6–10.2)
Chloride: 101 mmol/L (ref 96–106)
GFR calc Af Amer: 104 mL/min/{1.73_m2} (ref >59)
GFR, EST NON AFRICAN AMERICAN: 90 mL/min/{1.73_m2} (ref >59)
GLUCOSE: 229 mg/dL — AB (ref 65–99)
Potassium: 4.3 mmol/L (ref 3.5–5.2)
Sodium: 139 mmol/L (ref 134–144)

## 2018-02-18 LAB — ROCKY MTN SPOTTED FVR ABS PNL(IGG+IGM)
RMSF IGM: 0.28 {index} (ref 0.00–0.89)
RMSF IgG: NEGATIVE

## 2018-02-18 LAB — LYME AB/WESTERN BLOT REFLEX

## 2018-02-20 DIAGNOSIS — R739 Hyperglycemia, unspecified: Secondary | ICD-10-CM | POA: Diagnosis not present

## 2018-02-20 LAB — BAYER DCA HB A1C WAIVED: HB A1C (BAYER DCA - WAIVED): 5.5 % (ref <7.0)

## 2018-02-20 NOTE — Addendum Note (Signed)
Addended by: Zannie Cove on: 02/20/2018 02:55 PM   Modules accepted: Orders

## 2018-03-01 ENCOUNTER — Telehealth: Payer: Self-pay

## 2018-03-01 NOTE — Telephone Encounter (Signed)
There is an order for a US venous R  Stat on 02/16/18 that looks like it was never done   Can this be cancelled now??

## 2018-03-02 ENCOUNTER — Ambulatory Visit (INDEPENDENT_AMBULATORY_CARE_PROVIDER_SITE_OTHER): Payer: Medicare HMO | Admitting: Orthopaedic Surgery

## 2018-03-02 ENCOUNTER — Ambulatory Visit (INDEPENDENT_AMBULATORY_CARE_PROVIDER_SITE_OTHER): Payer: Self-pay

## 2018-03-02 ENCOUNTER — Encounter (INDEPENDENT_AMBULATORY_CARE_PROVIDER_SITE_OTHER): Payer: Self-pay | Admitting: Orthopaedic Surgery

## 2018-03-02 VITALS — BP 126/71 | HR 62 | Ht 70.0 in | Wt 160.0 lb

## 2018-03-02 DIAGNOSIS — M25562 Pain in left knee: Secondary | ICD-10-CM

## 2018-03-02 DIAGNOSIS — M1712 Unilateral primary osteoarthritis, left knee: Secondary | ICD-10-CM

## 2018-03-02 DIAGNOSIS — M7122 Synovial cyst of popliteal space [Baker], left knee: Secondary | ICD-10-CM
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
## 2018-03-02 NOTE — Telephone Encounter (Signed)
Yes this can be cancelled.

## 2018-03-03 ENCOUNTER — Encounter (INDEPENDENT_AMBULATORY_CARE_PROVIDER_SITE_OTHER): Payer: Self-pay | Admitting: Orthopaedic Surgery

## 2018-03-03 DIAGNOSIS — M7122 Synovial cyst of popliteal space [Baker], left knee: Secondary | ICD-10-CM | POA: Insufficient documentation

## 2018-03-03 DIAGNOSIS — M1712 Unilateral primary osteoarthritis, left knee: Secondary | ICD-10-CM | POA: Insufficient documentation

## 2018-05-19 ENCOUNTER — Ambulatory Visit (INDEPENDENT_AMBULATORY_CARE_PROVIDER_SITE_OTHER): Payer: Medicare HMO | Admitting: Family Medicine

## 2018-05-19 ENCOUNTER — Encounter: Payer: Self-pay | Admitting: Family Medicine

## 2018-05-19 VITALS — BP 124/59 | HR 55 | Temp 97.9°F | Ht 70.0 in | Wt 157.4 lb

## 2018-05-19 DIAGNOSIS — I10 Essential (primary) hypertension: Secondary | ICD-10-CM

## 2018-05-19 DIAGNOSIS — R739 Hyperglycemia, unspecified: Secondary | ICD-10-CM

## 2018-05-19 LAB — BAYER DCA HB A1C WAIVED: HB A1C (BAYER DCA - WAIVED): 5.5 % (ref <7.0)

## 2018-05-19 MED ORDER — OLMESARTAN MEDOXOMIL-HCTZ 20-12.5 MG PO TABS: 1.0000 | ORAL_TABLET | Freq: Every day | ORAL | 1 refills | Status: DC

## 2018-05-19 NOTE — Addendum Note (Signed)
Addended by: Marylin Crosby on: 05/19/2018 01:39 PM   Modules accepted: Orders

## 2018-05-19 NOTE — Progress Notes (Signed)
Subjective:  Patient ID: Russell [name], male    DOB: 05/22/1951  Age: 67 y.o. MRN: [id number]  CC: Medical Management of Chronic Issues   HPI Russell [name] presents for  follow-up of hypertension. Patient has no history of headache chest pain or shortness of breath or recent cough. Patient also denies symptoms of TIA such as focal numbness or weakness. Patient denies side effects from medication. States taking it regularly. Recently had elevated glucose, random, but over 200 with nml A1c. Due for recheck of that as well.   History Russell [name] has a past medical history of adenomatous polyp of colon (03/24/2016), Hyperlipidemia, and Hypertension.   He has a past surgical history that includes Hernia repair; Fracture surgery (Left, age 57); Wisdom tooth extraction; Colonoscopy (2005); Upper gi endoscopy; and Upper gastrointestinal endoscopy.   His family history includes Diabetes in his mother; Heart disease in his mother; Hyperlipidemia in his father.He reports that he has been smoking cigarettes. He has a 56.25 pack-year smoking history. He has never used smokeless tobacco. He reports that he drinks about 7.0 standard drinks of alcohol per week. He reports that he does not use drugs.  Current Outpatient Medications on File Prior to Visit  Medication Sig Dispense Refill  . Ascorbic Acid (VITAMIN C PO) Take by mouth.    Marland Kitchen aspirin EC 81 MG tablet Take 81 mg by mouth every other day.     . Cholecalciferol (VITAMIN D) 2000 units CAPS Take 2,000 Units by mouth daily.    Marland Kitchen olmesartan-hydrochlorothiazide (BENICAR HCT) 20-12.5 MG tablet Take 1 tablet by mouth daily. 90 tablet 1  . Omega-3 Fatty Acids (FISH OIL) 1000 MG CAPS Take 1,000 mg by mouth.     No current facility-administered medications on file prior to visit.     ROS Review of Systems  Constitutional: Negative for fever.  Respiratory: Negative for shortness of breath.   Cardiovascular: Negative for chest pain.  Musculoskeletal: Negative  for arthralgias.  Skin: Negative for rash.    Objective:  BP (!) 124/59   Pulse (!) 55   Temp 97.9 F (36.6 C) (Oral)   Ht 5\' 10"  (1.778 m)   Wt 157 lb 6 oz (71.4 kg)   BMI 22.58 kg/m   BP Readings from Last 3 Encounters:  05/19/18 (!) 124/59  03/02/18 126/71  02/16/18 122/69    Wt Readings from Last 3 Encounters:  05/19/18 157 lb 6 oz (71.4 kg)  03/02/18 160 lb (72.6 kg)  02/16/18 157 lb 12.8 oz (71.6 kg)     Physical Exam  Constitutional: He is oriented to person, place, and time. He appears well-developed and well-nourished.  HENT:  Head: Normocephalic and atraumatic.  Right Ear: External ear normal.  Left Ear: External ear normal.  Mouth/Throat: No oropharyngeal exudate or posterior oropharyngeal erythema.  Eyes: Pupils are equal, round, and reactive to light.  Neck: Normal range of motion. Neck supple.  Cardiovascular: Normal rate and regular rhythm.  No murmur heard. Pulmonary/Chest: Breath sounds normal. No respiratory distress.  Neurological: He is alert and oriented to person, place, and time.  Vitals reviewed.     Assessment & Plan:   Russell [name] was seen today for medical management of chronic issues.  Diagnoses and all orders for this visit:  Essential hypertension  Hyperglycemia   Allergies as of 05/19/2018   No Known Allergies     Medication List        Accurate as of 05/19/18  1:29 PM. Always use  your most recent med list.          aspirin EC 81 MG tablet Take 81 mg by mouth every other day.   Fish Oil 1000 MG Caps Take 1,000 mg by mouth.   olmesartan-hydrochlorothiazide 20-12.5 MG tablet Commonly known as:  BENICAR HCT Take 1 tablet by mouth daily.   VITAMIN C PO Take by mouth.   Vitamin D 2000 units Caps Take 2,000 Units by mouth daily.       No orders of the defined types were placed in this encounter.   CMP, A1c pending  Follow-up: Return in about 6 months (around 11/17/2018) for Wellness.  [name], M.D.

## 2018-05-20 LAB — CMP14+EGFR
ALBUMIN: 4.2 g/dL (ref 3.6–4.8)
ALT: 14 IU/L (ref 0–44)
AST: 14 IU/L (ref 0–40)
Albumin/Globulin Ratio: 2.2 (ref 1.2–2.2)
Alkaline Phosphatase: 61 IU/L (ref 39–117)
BUN / CREAT RATIO: 21 (ref 10–24)
BUN: 25 mg/dL (ref 8–27)
Bilirubin Total: 0.4 mg/dL (ref 0.0–1.2)
CALCIUM: 8.9 mg/dL (ref 8.6–10.2)
CO2: 22 mmol/L (ref 20–29)
CREATININE: 1.17 mg/dL (ref 0.76–1.27)
Chloride: 106 mmol/L (ref 96–106)
GFR calc Af Amer: 74 mL/min/{1.73_m2} (ref >59)
GFR, EST NON AFRICAN AMERICAN: 64 mL/min/{1.73_m2} (ref >59)
GLOBULIN, TOTAL: 1.9 g/dL (ref 1.5–4.5)
GLUCOSE: 87 mg/dL (ref 65–99)
Potassium: 4.6 mmol/L (ref 3.5–5.2)
Sodium: 144 mmol/L (ref 134–144)
Total Protein: 6.1 g/dL (ref 6.0–8.5)

## 2018-07-28 DIAGNOSIS — H10013 Acute follicular conjunctivitis, bilateral: Secondary | ICD-10-CM | POA: Diagnosis not present

## 2018-11-17 ENCOUNTER — Encounter: Payer: Medicare HMO | Admitting: Family Medicine

## 2018-11-17 ENCOUNTER — Ambulatory Visit: Payer: Medicare HMO | Admitting: Family Medicine

## 2018-11-20 ENCOUNTER — Other Ambulatory Visit: Payer: Self-pay

## 2018-11-20 ENCOUNTER — Ambulatory Visit (INDEPENDENT_AMBULATORY_CARE_PROVIDER_SITE_OTHER): Payer: Medicare HMO | Admitting: Family Medicine

## 2018-11-20 ENCOUNTER — Encounter: Payer: Self-pay | Admitting: Family Medicine

## 2018-11-20 VITALS — BP 135/67 | HR 63 | Temp 97.4°F | Ht 70.0 in | Wt 159.0 lb

## 2018-11-20 DIAGNOSIS — R001 Bradycardia, unspecified: Secondary | ICD-10-CM | POA: Diagnosis not present

## 2018-11-20 DIAGNOSIS — I10 Essential (primary) hypertension: Secondary | ICD-10-CM | POA: Diagnosis not present

## 2018-11-20 DIAGNOSIS — Z0000 Encounter for general adult medical examination without abnormal findings: Secondary | ICD-10-CM | POA: Diagnosis not present

## 2018-11-20 DIAGNOSIS — Z125 Encounter for screening for malignant neoplasm of prostate: Secondary | ICD-10-CM

## 2018-11-20 DIAGNOSIS — L409 Psoriasis, unspecified: Secondary | ICD-10-CM

## 2018-11-20 DIAGNOSIS — M1712 Unilateral primary osteoarthritis, left knee: Secondary | ICD-10-CM | POA: Diagnosis not present

## 2018-11-20 DIAGNOSIS — Z0001 Encounter for general adult medical examination with abnormal findings: Secondary | ICD-10-CM | POA: Diagnosis not present

## 2018-11-20 LAB — URINALYSIS
BILIRUBIN UA: NEGATIVE
Glucose, UA: NEGATIVE
Ketones, UA: NEGATIVE
Leukocytes, UA: NEGATIVE
NITRITE UA: NEGATIVE
PH UA: 6 (ref 5.0–7.5)
PROTEIN UA: NEGATIVE
Specific Gravity, UA: 1.025 (ref 1.005–1.030)
UUROB: 0.2 mg/dL (ref 0.2–1.0)

## 2018-11-20 MED ORDER — BETAMETHASONE DIPROPIONATE AUG 0.05 % EX CREA: TOPICAL_CREAM | Freq: Two times a day (BID) | CUTANEOUS | 5 refills | Status: DC

## 2018-11-20 MED ORDER — OLMESARTAN MEDOXOMIL-HCTZ 20-12.5 MG PO TABS: 1.0000 | ORAL_TABLET | Freq: Every day | ORAL | 1 refills | Status: DC

## 2018-11-20 NOTE — Progress Notes (Signed)
Subjective:  Patient ID: Russell [name], male    DOB: 1950/12/16  Age: 68 y.o. MRN: [id number]  CC: Annual Exam   HPI Russell [name] presents for complete physical examination.  Patient has a history of arrhythmia and bradycardia.  He denies any kinds of chest pain and shortness of breath.  He does not report any palpitations today.  He is taking the olmesartan HCTZ combination every day and says his blood pressures running good at home.  However, he notices some urinary frequency for several hours after each dose.  Depression screen [facility] 2/9 11/20/2018 05/19/2018 02/16/2018  Decreased Interest 0 0 0  Down, Depressed, Hopeless 0 0 0  PHQ - 2 Score 0 0 0    History  Patient Active Problem List   Diagnosis Date Noted  . Unilateral primary osteoarthritis, left knee 03/03/2018  . Baker's cyst of knee, left 03/03/2018  . Hx of adenomatous polyp of colon 03/24/2016  . Hypertension     Russell [name] has a past medical history of adenomatous polyp of colon (03/24/2016), Hyperlipidemia, and Hypertension.   He has a past surgical history that includes Hernia repair; Fracture surgery (Left, age 81); Wisdom tooth extraction; Colonoscopy (2005); Upper gi endoscopy; and Upper gastrointestinal endoscopy.   His family history includes Diabetes in his mother; Heart disease in his mother; Hyperlipidemia in his father.He reports that he has been smoking cigarettes. He has a 56.25 pack-year smoking history. He has never used smokeless tobacco. He reports current alcohol use of about 7.0 standard drinks of alcohol per week. He reports that he does not use drugs.    ROS Review of Systems  Constitutional: Negative for activity change, fatigue and unexpected weight change.  HENT: Negative for congestion, ear pain, hearing loss, postnasal drip and trouble swallowing.   Eyes: Negative for pain and visual disturbance.  Respiratory: Negative for cough, chest tightness and shortness of breath.   Cardiovascular: Negative  for chest pain, palpitations and leg swelling.  Gastrointestinal: Negative for abdominal distention, abdominal pain, blood in stool, constipation, diarrhea, nausea and vomiting.  Endocrine: Negative for cold intolerance, heat intolerance and polydipsia.  Genitourinary: Negative for difficulty urinating, dysuria, flank pain, frequency and urgency.  Musculoskeletal: Negative for arthralgias and joint swelling.  Skin: Negative for color change, rash and wound.  Neurological: Negative for dizziness, syncope, speech difficulty, weakness, light-headedness, numbness and headaches.  Hematological: Does not bruise/bleed easily.  Psychiatric/Behavioral: Negative for confusion, decreased concentration, dysphoric mood and sleep disturbance. The patient is not nervous/anxious.     Objective:  BP 135/67   Pulse 63   Temp (!) 97.4 F (36.3 C) (Oral)   Ht 5\' 10"  (1.778 m)   Wt 159 lb (72.1 kg)   BMI 22.81 kg/m   BP Readings from Last 3 Encounters:  11/20/18 135/67  05/19/18 (!) 124/59  03/02/18 126/71    Wt Readings from Last 3 Encounters:  11/20/18 159 lb (72.1 kg)  05/19/18 157 lb 6 oz (71.4 kg)  03/02/18 160 lb (72.6 kg)     Physical Exam Constitutional:      Appearance: He is well-developed.  HENT:     Head: Normocephalic and atraumatic.  Eyes:     Pupils: Pupils are equal, round, and reactive to light.  Neck:     Musculoskeletal: Normal range of motion.     Thyroid: No thyromegaly.     Trachea: No tracheal deviation.  Cardiovascular:     Rate and Rhythm: Normal rate and regular rhythm.  Heart sounds: Normal heart sounds. No murmur. No friction rub. No gallop.   Pulmonary:     Breath sounds: Normal breath sounds. No wheezing or rales.  Abdominal:     General: Bowel sounds are normal. There is no distension.     Palpations: Abdomen is soft. There is no mass.     Tenderness: There is no abdominal tenderness.     Hernia: There is no hernia in the right inguinal area or left  inguinal area.  Genitourinary:    Penis: Normal.      Scrotum/Testes: Normal.  Musculoskeletal: Normal range of motion.  Lymphadenopathy:     Cervical: No cervical adenopathy.  Skin:    General: Skin is warm and dry.     Findings: Rash (Small plaques at the radial styloid region bilaterally.  3 small coin lesions at the left neck inferior to the ear.) present.  Neurological:     Mental Status: He is alert and oriented to person, place, and time.       Assessment & Plan:   Russell [name] was seen today for annual exam.  Diagnoses and all orders for this visit:  Well adult exam  Essential hypertension  Screening for prostate cancer  Psoriasis  Unilateral primary osteoarthritis, left knee       I am having Russell [name] maintain his aspirin EC, Fish Oil, Vitamin D, Ascorbic Acid (VITAMIN C PO), and olmesartan-hydrochlorothiazide.  Allergies as of 11/20/2018   No Known Allergies     Medication List       Accurate as of November 20, 2018  9:15 AM. Always use your most recent med list.        aspirin EC 81 MG tablet Take 81 mg by mouth every other day.   Fish Oil 1000 MG Caps Take 1,000 mg by mouth.   olmesartan-hydrochlorothiazide 20-12.5 MG tablet Commonly known as:  BENICAR HCT Take 1 tablet by mouth daily.   VITAMIN C PO Take by mouth.   Vitamin D 50 MCG (2000 UT) Caps Take 2,000 Units by mouth daily.        Follow-up: Return in about 6 months (around 05/23/2019).  [name], M.D.

## 2018-11-20 NOTE — Addendum Note (Signed)
Addended by: Marylin Crosby on: 11/20/2018 09:54 AM   Modules accepted: Orders

## 2018-11-21 LAB — CBC WITH DIFFERENTIAL/PLATELET
BASOS ABS: 0.1 10*3/uL (ref 0.0–0.2)
Basos: 1 %
EOS (ABSOLUTE): 0.4 10*3/uL (ref 0.0–0.4)
EOS: 3 %
HEMATOCRIT: 44.1 % (ref 37.5–51.0)
Hemoglobin: 14.8 g/dL (ref 13.0–17.7)
Immature Grans (Abs): 0 10*3/uL (ref 0.0–0.1)
Immature Granulocytes: 0 %
LYMPHS ABS: 1.9 10*3/uL (ref 0.7–3.1)
Lymphs: 16 %
MCH: 32 pg (ref 26.6–33.0)
MCHC: 33.6 g/dL (ref 31.5–35.7)
MCV: 95 fL (ref 79–97)
MONOS ABS: 0.7 10*3/uL (ref 0.1–0.9)
Monocytes: 6 %
NEUTROS ABS: 9.4 10*3/uL — AB (ref 1.4–7.0)
Neutrophils: 74 %
Platelets: 209 10*3/uL (ref 150–450)
RBC: 4.63 x10E6/uL (ref 4.14–5.80)
RDW: 12.4 % (ref 11.6–15.4)
WBC: 12.5 10*3/uL — AB (ref 3.4–10.8)

## 2018-11-21 LAB — LIPID PANEL
CHOL/HDL RATIO: 1.7 ratio (ref 0.0–5.0)
Cholesterol, Total: 163 mg/dL (ref 100–199)
HDL: 95 mg/dL (ref >39)
LDL CALC: 62 mg/dL (ref 0–99)
TRIGLYCERIDES: 29 mg/dL (ref 0–149)
VLDL Cholesterol Cal: 6 mg/dL (ref 5–40)

## 2018-11-21 LAB — CMP14+EGFR
A/G RATIO: 1.8 (ref 1.2–2.2)
ALK PHOS: 61 IU/L (ref 39–117)
ALT: 14 IU/L (ref 0–44)
AST: 18 IU/L (ref 0–40)
Albumin: 4 g/dL (ref 3.8–4.8)
BILIRUBIN TOTAL: 0.4 mg/dL (ref 0.0–1.2)
BUN / CREAT RATIO: 15 (ref 10–24)
BUN: 15 mg/dL (ref 8–27)
CO2: 22 mmol/L (ref 20–29)
CREATININE: 1 mg/dL (ref 0.76–1.27)
Calcium: 9.1 mg/dL (ref 8.6–10.2)
Chloride: 104 mmol/L (ref 96–106)
GFR calc Af Amer: 89 mL/min/{1.73_m2} (ref >59)
GFR calc non Af Amer: 77 mL/min/{1.73_m2} (ref >59)
GLOBULIN, TOTAL: 2.2 g/dL (ref 1.5–4.5)
Glucose: 114 mg/dL — ABNORMAL HIGH (ref 65–99)
Potassium: 4.7 mmol/L (ref 3.5–5.2)
SODIUM: 141 mmol/L (ref 134–144)
Total Protein: 6.2 g/dL (ref 6.0–8.5)

## 2018-11-21 LAB — PSA, TOTAL AND FREE
PSA FREE: 0.17 ng/mL
PSA, Free Pct: 28.3 %
Prostate Specific Ag, Serum: 0.6 ng/mL (ref 0.0–4.0)

## 2018-11-21 LAB — VITAMIN D 25 HYDROXY (VIT D DEFICIENCY, FRACTURES): Vit D, 25-Hydroxy: 57.2 ng/mL (ref 30.0–100.0)

## 2018-11-21 NOTE — Progress Notes (Signed)
Hello Russell [name],  Your lab result is normal.Some minor variations that are not significant are commonly marked abnormal, but do not represent any medical problem for you.  Best regards, [name], M.D.

## 2019-03-01 ENCOUNTER — Encounter: Payer: Self-pay | Admitting: Internal Medicine

## 2019-03-03 DIAGNOSIS — H2513 Age-related nuclear cataract, bilateral: Secondary | ICD-10-CM | POA: Diagnosis not present

## 2019-03-03 DIAGNOSIS — H40033 Anatomical narrow angle, bilateral: Secondary | ICD-10-CM | POA: Diagnosis not present

## 2019-04-16 ENCOUNTER — Telehealth: Payer: Self-pay | Admitting: Family Medicine

## 2019-04-16 NOTE — Chronic Care Management (AMB) (Signed)
Chronic Care Management   Note  04/16/2019 Name: Russell [name] MRN: [id number] DOB: June 17, 1951  Russell [name] is a 68 y.o. year old male who is a primary care patient of Stacks, Cletus Gash, MD. I reached out to Russell [name] by phone today in response to a referral sent by Russell [name] health plan.    Russell [name] was given information about Chronic Care Management services today including:  1. CCM service includes personalized support from designated clinical staff supervised by his physician, including individualized plan of care and coordination with other care providers 2. 24/7 contact phone numbers for assistance for urgent and routine care needs. 3. Service will only be billed when office clinical staff spend 20 minutes or more in a month to coordinate care. 4. Only one practitioner may furnish and bill the service in a calendar month. 5. The patient may stop CCM services at any time (effective at the end of the month) by phone call to the office staff. 6. The patient will be responsible for cost sharing (co-pay) of up to 20% of the service fee (after annual deductible is met).  Patient agreed to services and verbal consent obtained.   Follow up plan: Telephone appointment with CCM team member scheduled for: 05/19/2018  [place]  [email or web address]   ??[phone]

## 2019-05-10 ENCOUNTER — Other Ambulatory Visit: Payer: Self-pay | Admitting: *Deleted

## 2019-05-10 ENCOUNTER — Ambulatory Visit: Payer: Medicare HMO | Admitting: *Deleted

## 2019-05-10 DIAGNOSIS — I10 Essential (primary) hypertension: Secondary | ICD-10-CM

## 2019-05-10 DIAGNOSIS — M1712 Unilateral primary osteoarthritis, left knee: Secondary | ICD-10-CM

## 2019-05-10 MED ORDER — OLMESARTAN MEDOXOMIL-HCTZ 20-12.5 MG PO TABS: 1.0000 | ORAL_TABLET | Freq: Every day | ORAL | 0 refills | Status: DC

## 2019-05-10 NOTE — Chronic Care Management (AMB) (Signed)
  Chronic Care Management   Initial Visit Note  05/10/2019 Name: Russell [name] MRN: [id number] DOB: 03/23/1951  [name] is a primary care patient of Dr Livia Snellen and was referred to the CCM program by his health plan for assistance with chronic care management and care coordination. His chart was reviewed as part of the CCM process piror to the telephone visit. His last visit with Dr Livia Snellen was 11/2018 and he was asked to follow-up in 6 months. His chronic medical conditions are listed as  Hypertension, osteoarthritis, adenomatous colon polyp.   I spoke with [name] by telephone today regarding his chronic medical conditions and the Chronic Care Management Program.   Subjective "I don't really need any help with anything right now other than having my blood pressure medicine refilled."   [name] requested a refill on Olmesartan to be sent to [facility]. He requested that his 05/23/2019 follow-up visit with Dr Livia Snellen be moved to October. He does not check his blood pressure at home regularly. He does take his medication as prescribed and is not having any side effects or any concerns about how well it is working.  [name] is not interested in receiving CCM services at this time.   Objective BP Readings from Last 3 Encounters:  11/20/18 135/67  05/19/18 (!) 124/59  03/02/18 126/71   Wt Readings from Last 3 Encounters:  11/20/18 159 lb (72.1 kg)  05/19/18 157 lb 6 oz (71.4 kg)  03/02/18 160 lb (72.6 kg)   BMI Readings from Last 3 Encounters:  11/20/18 22.81 kg/m  05/19/18 22.58 kg/m  03/02/18 22.96 kg/m   Allergies as of 05/10/2019   No Known Allergies     Medication List       Accurate as of May 10, 2019 11:08 AM. If you have any questions, ask your nurse or doctor.        aspirin EC 81 MG tablet Take 81 mg by mouth every other day.   augmented betamethasone dipropionate 0.05 % cream Commonly known as: DIPROLENE-AF Apply topically 2 (two) times daily.  At affected areas (avoid face and genitals)   Fish Oil 1000 MG Caps Take 1,000 mg by mouth.   olmesartan-hydrochlorothiazide 20-12.5 MG tablet Commonly known as: BENICAR HCT Take 1 tablet by mouth daily.   VITAMIN C PO Take by mouth.   Vitamin D 50 MCG (2000 UT) Caps Take 2,000 Units by mouth daily.      Adenomatous colon polyp removed 7/017 by Dr Carlean Purl.   Assessment & Plan Initial Telephone Visit for CCM services No CCM needs endorsed by patient and no obvious needs identified by [facility] Due for colonoscopy with Dr Carlean Purl. Call [phone] to schedule The patient has been provided with contact information for the care management team and has been advised to call with any health related questions or concerns.  Next PCP appointment scheduled for: 07/03/2019 with Dr Livia Snellen   [name] BSN, RN-BC [place] [facility] [place] [facility] Direct Dial: [phone]

## 2019-05-10 NOTE — Telephone Encounter (Signed)
Requesting refill of Olmesartan to [facility]. No complaints or concerns regarding HTN management. Scheduled for f/u with Dr Livia Snellen in October.

## 2019-05-10 NOTE — Patient Instructions (Addendum)
Russell [name] was given information about Chronic Care Management services today including:  1. CCM service includes personalized support from designated clinical staff supervised by his physician, including individualized plan of care and coordination with other care providers 2. 24/7 contact phone numbers for assistance for urgent and routine care needs. 3. Service will only be billed when office clinical staff spend 20 minutes or more in a month to coordinate care. 4. Only one practitioner may furnish and bill the service in a calendar month. 5. The patient may stop CCM services at any time (effective at the end of the month) by phone call to the office staff. 6. The patient will be responsible for cost sharing (co-pay) of up to 20% of the service fee (after annual deductible is met).  Patient did not agree to enrollment in care management services and does not wish to consider at this time.  Assessment & Plan Initial Telephone Visit for CCM services No CCM needs endorsed by patient and no obvious needs identified by Russell [name] Due for Colonoscopy with Russell [name]. Call [phone] to schedule. The patient has been provided with contact information for the care management team [phone]) and has been advised to call with any health related questions or concerns.  Next PCP appointment scheduled for: 07/03/2019 with Russell [name]   Russell [name] BSN, RN-BC [name] [facility] Direct Dial: [phone]

## 2019-05-23 ENCOUNTER — Ambulatory Visit: Payer: Medicare HMO | Admitting: Family Medicine

## 2019-07-03 ENCOUNTER — Ambulatory Visit: Payer: Medicare HMO | Admitting: Family Medicine

## 2019-07-04 ENCOUNTER — Other Ambulatory Visit: Payer: Self-pay

## 2019-07-05 ENCOUNTER — Encounter: Payer: Self-pay | Admitting: Family Medicine

## 2019-07-05 ENCOUNTER — Ambulatory Visit (INDEPENDENT_AMBULATORY_CARE_PROVIDER_SITE_OTHER): Payer: Medicare HMO | Admitting: Family Medicine

## 2019-07-05 VITALS — BP 130/78 | HR 63 | Temp 98.4°F | Ht 70.0 in | Wt 159.0 lb

## 2019-07-05 DIAGNOSIS — Z23 Encounter for immunization: Secondary | ICD-10-CM

## 2019-07-05 DIAGNOSIS — I10 Essential (primary) hypertension: Secondary | ICD-10-CM | POA: Diagnosis not present

## 2019-07-05 NOTE — Progress Notes (Signed)
Subjective:  Patient ID: Russell [name], male    DOB: 1950-12-04  Age: 68 y.o. MRN: [id number]  CC: Hypertension and Medical Management of Chronic Issues (6 month )   HPI Russell [name] presents for  follow-up of hypertension. Patient has no history of headache chest pain or shortness of breath or recent cough. Patient also denies symptoms of TIA such as focal numbness or weakness. Patient denies side effects from medication. States taking it regularly.   History Russell [name] has a past medical history of adenomatous polyp of colon (03/24/2016), Hyperlipidemia, and Hypertension.   He has a past surgical history that includes Hernia repair; Fracture surgery (Left, age 12); Wisdom tooth extraction; Colonoscopy (2005); Upper gi endoscopy; and Upper gastrointestinal endoscopy.   His family history includes Diabetes in his mother; Heart disease in his mother; Hyperlipidemia in his father.He reports that he has been smoking cigarettes. He has a 56.25 pack-year smoking history. He has never used smokeless tobacco. He reports current alcohol use of about 7.0 standard drinks of alcohol per week. He reports that he does not use drugs.  Current Outpatient Medications on File Prior to Visit  Medication Sig Dispense Refill  . Ascorbic Acid (VITAMIN C PO) Take by mouth.    Marland Kitchen aspirin EC 81 MG tablet Take 81 mg by mouth every other day.     . augmented betamethasone dipropionate (DIPROLENE-AF) 0.05 % cream Apply topically 2 (two) times daily. At affected areas (avoid face and genitals) 50 g 5  . Cholecalciferol (VITAMIN D) 2000 units CAPS Take 2,000 Units by mouth daily.    Marland Kitchen olmesartan-hydrochlorothiazide (BENICAR HCT) 20-12.5 MG tablet Take 1 tablet by mouth daily. 90 tablet 0  . Omega-3 Fatty Acids (FISH OIL) 1000 MG CAPS Take 1,000 mg by mouth.     No current facility-administered medications on file prior to visit.     ROS Review of Systems  Constitutional: Negative.   HENT: Negative.   Eyes: Negative  for visual disturbance.  Respiratory: Negative for cough and shortness of breath.   Cardiovascular: Negative for chest pain and leg swelling.  Gastrointestinal: Negative for abdominal pain, diarrhea, nausea and vomiting.  Genitourinary: Negative for difficulty urinating.  Musculoskeletal: Negative for arthralgias and myalgias.  Skin: Negative for rash.  Neurological: Negative for headaches.  Psychiatric/Behavioral: Negative for sleep disturbance.    Objective:  BP 130/78   Pulse 63   Temp 98.4 F (36.9 C) (Temporal)   Ht '5\' 10"'  (1.778 m)   Wt 159 lb (72.1 kg)   SpO2 96%   BMI 22.81 kg/m   BP Readings from Last 3 Encounters:  07/05/19 130/78  11/20/18 135/67  05/19/18 (!) 124/59    Wt Readings from Last 3 Encounters:  07/05/19 159 lb (72.1 kg)  11/20/18 159 lb (72.1 kg)  05/19/18 157 lb 6 oz (71.4 kg)     Physical Exam Vitals signs reviewed.  Constitutional:      Appearance: He is well-developed.  HENT:     Head: Normocephalic and atraumatic.     Right Ear: External ear normal.     Left Ear: External ear normal.     Mouth/Throat:     Pharynx: No oropharyngeal exudate or posterior oropharyngeal erythema.  Eyes:     Pupils: Pupils are equal, round, and reactive to light.  Neck:     Musculoskeletal: Normal range of motion and neck supple.  Cardiovascular:     Rate and Rhythm: Normal rate and regular rhythm.  Heart sounds: No murmur.  Pulmonary:     Effort: No respiratory distress.     Breath sounds: Normal breath sounds.  Neurological:     General: No focal deficit present.     Mental Status: He is alert and oriented to person, place, and time.     Coordination: Coordination normal.  Psychiatric:        Mood and Affect: Mood normal.        Behavior: Behavior normal.        Thought Content: Thought content normal.       Assessment & Plan:   Russell [name] was seen today for hypertension and medical management of chronic issues.  Diagnoses and all orders  for this visit:  Essential hypertension -     CMP14+EGFR -     Lipid panel  Need for immunization against influenza -     Flu Vaccine QUAD High Dose(Fluad)   Allergies as of 07/05/2019   No Known Allergies     Medication List       Accurate as of July 05, 2019 11:59 PM. If you have any questions, ask your nurse or doctor.        aspirin EC 81 MG tablet Take 81 mg by mouth every other day.   augmented betamethasone dipropionate 0.05 % cream Commonly known as: DIPROLENE-AF Apply topically 2 (two) times daily. At affected areas (avoid face and genitals)   Fish Oil 1000 MG Caps Take 1,000 mg by mouth.   olmesartan-hydrochlorothiazide 20-12.5 MG tablet Commonly known as: BENICAR HCT Take 1 tablet by mouth daily.   VITAMIN C PO Take by mouth.   Vitamin D 50 MCG (2000 UT) Caps Take 2,000 Units by mouth daily.         Continue current meds & treatments.   Follow-up: Return in about 6 months (around 01/03/2020) for CPE.  Russell [name], M.D.

## 2019-07-06 LAB — LIPID PANEL
Chol/HDL Ratio: 2.1 ratio (ref 0.0–5.0)
Cholesterol, Total: 196 mg/dL (ref 100–199)
HDL: 93 mg/dL (ref >39)
LDL Chol Calc (NIH): 95 mg/dL (ref 0–99)
Triglycerides: 39 mg/dL (ref 0–149)
VLDL Cholesterol Cal: 8 mg/dL (ref 5–40)

## 2019-07-06 LAB — CMP14+EGFR
ALT: 12 IU/L (ref 0–44)
AST: 16 IU/L (ref 0–40)
Albumin/Globulin Ratio: 2.3 — ABNORMAL HIGH (ref 1.2–2.2)
Albumin: 4.4 g/dL (ref 3.8–4.8)
Alkaline Phosphatase: 60 IU/L (ref 39–117)
BUN/Creatinine Ratio: 13 (ref 10–24)
BUN: 13 mg/dL (ref 8–27)
Bilirubin Total: 0.4 mg/dL (ref 0.0–1.2)
CO2: 27 mmol/L (ref 20–29)
Calcium: 9.5 mg/dL (ref 8.6–10.2)
Chloride: 102 mmol/L (ref 96–106)
Creatinine, Ser: 1 mg/dL (ref 0.76–1.27)
GFR calc Af Amer: 89 mL/min/{1.73_m2} (ref >59)
GFR calc non Af Amer: 77 mL/min/{1.73_m2} (ref >59)
Globulin, Total: 1.9 g/dL (ref 1.5–4.5)
Glucose: 101 mg/dL — ABNORMAL HIGH (ref 65–99)
Potassium: 4.9 mmol/L (ref 3.5–5.2)
Sodium: 138 mmol/L (ref 134–144)
Total Protein: 6.3 g/dL (ref 6.0–8.5)

## 2019-07-07 NOTE — Progress Notes (Signed)
Hello Russell [name],  Your lab result is normal and/or stable.Some minor variations that are not significant are commonly marked abnormal, but do not represent any medical problem for you.  Best regards, Towanda Hornstein, M.D.

## 2019-09-03 ENCOUNTER — Telehealth: Payer: Self-pay | Admitting: Family Medicine

## 2019-09-03 MED ORDER — OLMESARTAN MEDOXOMIL-HCTZ 20-12.5 MG PO TABS: 1.0000 | ORAL_TABLET | Freq: Every day | ORAL | 0 refills | Status: DC

## 2019-09-03 NOTE — Telephone Encounter (Signed)
Pt called stating that he is about to run out of his BP medication on 09/11/2019. Needs refill sent to his [facility] mail order. Scheduled televisit with Dr Livia Snellen on 09/12/2019, which was his first available appt.

## 2019-09-03 NOTE — Telephone Encounter (Signed)
1 month supply sent- patient aware.

## 2019-09-12 ENCOUNTER — Other Ambulatory Visit: Payer: Self-pay

## 2019-09-12 ENCOUNTER — Encounter: Payer: Self-pay | Admitting: Family Medicine

## 2019-09-12 ENCOUNTER — Ambulatory Visit (INDEPENDENT_AMBULATORY_CARE_PROVIDER_SITE_OTHER): Payer: Medicare HMO | Admitting: Family Medicine

## 2019-09-12 DIAGNOSIS — I10 Essential (primary) hypertension: Secondary | ICD-10-CM

## 2019-09-12 MED ORDER — OLMESARTAN MEDOXOMIL-HCTZ 20-12.5 MG PO TABS: 1.0000 | ORAL_TABLET | Freq: Every day | ORAL | 1 refills | Status: DC

## 2019-09-12 NOTE — Progress Notes (Signed)
    Subjective:    Patient ID: Russell [name], male    DOB: [birth year], 69 y.o.   MRN: [id number]   HPI: Russell [name] is a 69 y.o. male presenting for  presents for  follow-up of hypertension. Patient has no history of headache chest pain or shortness of breath or recent cough. Patient also denies symptoms of TIA such as focal numbness or weakness. Patient denies side effects from medication. States taking it regularly.     Depression screen [facility] 2/9 07/05/2019 11/20/2018 05/19/2018 02/16/2018 11/15/2017  Decreased Interest 0 0 0 0 0  Down, Depressed, Hopeless 0 0 0 0 0  PHQ - 2 Score 0 0 0 0 0     Relevant past medical, surgical, family and social history reviewed and updated as indicated.  Interim medical history since our last visit reviewed. Allergies and medications reviewed and updated.  ROS:  Review of Systems  Constitutional: Negative for fever.  Respiratory: Negative for shortness of breath.   Cardiovascular: Negative for chest pain.  Musculoskeletal: Negative for arthralgias.  Skin: Negative for rash.     Social History   Tobacco Use  Smoking Status Current Every Day Smoker  . Packs/day: 1.25  . Years: 45.00  . Pack years: 56.25  . Types: Cigarettes  Smokeless Tobacco Never Used       Objective:     Wt Readings from Last 3 Encounters:  07/05/19 159 lb (72.1 kg)  11/20/18 159 lb (72.1 kg)  05/19/18 157 lb 6 oz (71.4 kg)     Exam deferred. Pt. Harboring due to COVID 19. Phone visit performed.   Assessment & Plan:   1. Essential hypertension     Meds ordered this encounter  Medications  . olmesartan-hydrochlorothiazide (BENICAR HCT) 20-12.5 MG tablet    Sig: Take 1 tablet by mouth daily.    Dispense:  90 tablet    Refill:  1    No orders of the defined types were placed in this encounter.     Diagnoses and all orders for this visit:  Essential hypertension  Other orders -     olmesartan-hydrochlorothiazide (BENICAR HCT) 20-12.5 MG tablet;  Take 1 tablet by mouth daily.    Virtual Visit via telephone Note  I discussed the limitations, risks, security and privacy concerns of performing an evaluation and management service by telephone and the availability of in person appointments. The patient was identified with two identifiers. Pt.expressed understanding and agreed to proceed. Pt. Is at home. Dr. Livia Snellen is in his office.  Follow Up Instructions:   I discussed the assessment and treatment plan with the patient. The patient was provided an opportunity to ask questions and all were answered. The patient agreed with the plan and demonstrated an understanding of the instructions.   The patient was advised to call back or seek an in-person evaluation if the symptoms worsen or if the condition fails to improve as anticipated.   Total minutes including chart review and phone contact time: 7   Follow up plan: Return in about 3 months (around 12/11/2019) for Wellness.  [name], MD [place]

## 2019-09-27 ENCOUNTER — Ambulatory Visit: Payer: Medicare HMO | Attending: Internal Medicine

## 2019-09-27 DIAGNOSIS — Z23 Encounter for immunization: Secondary | ICD-10-CM | POA: Insufficient documentation

## 2019-09-27 NOTE — Progress Notes (Signed)
   Covid-19 Vaccination Clinic  Name:  Russell [name]    MRN: [id number] DOB: 09-19-1950  09/27/2019  Russell [name] was observed post Covid-19 immunization for 15 minutes without incidence. He was provided with Vaccine Information Sheet and instruction to access the V-Safe system.   Russell [name] was instructed to call 911 with any severe reactions post vaccine: Marland Kitchen Difficulty breathing  . Swelling of your face and throat  . A fast heartbeat  . A bad rash all over your body  . Dizziness and weakness    Immunizations Administered    Name Date Dose VIS Date Route   Pfizer COVID-19 Vaccine 09/27/2019  1:42 PM 0.3 mL 08/17/2019 Intramuscular   Manufacturer: [place]   Lot: [id number]   [place]: [id number]

## 2019-10-18 ENCOUNTER — Ambulatory Visit: Payer: Medicare HMO | Attending: Internal Medicine

## 2019-10-18 DIAGNOSIS — Z23 Encounter for immunization: Secondary | ICD-10-CM | POA: Insufficient documentation

## 2019-10-18 NOTE — Progress Notes (Signed)
   Covid-19 Vaccination Clinic  Name:  Russell [name]    MRN: [id number] DOB: 06/14/1951  10/18/2019  Russell [name] was observed post Covid-19 immunization for 15 minutes without incidence. He was provided with Vaccine Information Sheet and instruction to access the V-Safe system.   Russell [name] was instructed to call 911 with any severe reactions post vaccine: Marland Kitchen Difficulty breathing  . Swelling of your face and throat  . A fast heartbeat  . A bad rash all over your body  . Dizziness and weakness    Immunizations Administered    Name Date Dose VIS Date Route   Pfizer COVID-19 Vaccine 10/18/2019  2:30 PM 0.3 mL 08/17/2019 Intramuscular   Manufacturer: [place]   Lot: [id number]   [place]: [id number]

## 2020-01-02 ENCOUNTER — Encounter: Payer: Medicare HMO | Admitting: Family Medicine

## 2020-02-28 ENCOUNTER — Other Ambulatory Visit: Payer: Self-pay

## 2020-02-28 ENCOUNTER — Ambulatory Visit (INDEPENDENT_AMBULATORY_CARE_PROVIDER_SITE_OTHER): Payer: Medicare HMO | Admitting: Family Medicine

## 2020-02-28 ENCOUNTER — Encounter: Payer: Self-pay | Admitting: Family Medicine

## 2020-02-28 VITALS — BP 152/78 | HR 75 | Temp 97.4°F | Resp 20 | Ht 70.0 in | Wt 154.5 lb

## 2020-02-28 DIAGNOSIS — I951 Orthostatic hypotension: Secondary | ICD-10-CM

## 2020-02-28 DIAGNOSIS — Z0001 Encounter for general adult medical examination with abnormal findings: Secondary | ICD-10-CM

## 2020-02-28 DIAGNOSIS — Z125 Encounter for screening for malignant neoplasm of prostate: Secondary | ICD-10-CM | POA: Diagnosis not present

## 2020-02-28 DIAGNOSIS — I10 Essential (primary) hypertension: Secondary | ICD-10-CM

## 2020-02-28 DIAGNOSIS — K4090 Unilateral inguinal hernia, without obstruction or gangrene, not specified as recurrent: Secondary | ICD-10-CM

## 2020-02-28 DIAGNOSIS — Z0000 Encounter for general adult medical examination without abnormal findings: Secondary | ICD-10-CM

## 2020-02-28 DIAGNOSIS — E559 Vitamin D deficiency, unspecified: Secondary | ICD-10-CM

## 2020-02-28 MED ORDER — OLMESARTAN MEDOXOMIL 40 MG PO TABS: 40.0000 mg | ORAL_TABLET | Freq: Every day | ORAL | 1 refills | Status: DC

## 2020-02-28 NOTE — Progress Notes (Signed)
Subjective:  Patient ID: Russell [name], male    DOB: 04-07-1951  Age: 69 y.o. MRN: [id number]  CC: No chief complaint on file.   HPI Russell [name] presents for annual physical exam as well as follow-up of hypertension. Patient has no history of headache chest pain or shortness of breath or recent cough. Patient also denies symptoms of TIA such as focal numbness or weakness. Patient denies side effects from medication.  One possible exception is that he is having some lightheadedness it is mild he says but it happens when he is first getting up from seated to standing or laying to standing etc.  It happened bad enough few days ago on one occasion only that he staggered for about his first 20 paces.  Other than that its been rather mild.  Its been ongoing for several weeks intermittently.  Mr. Deguia has not taken his blood pressure medicine the last 2 days.  He wanted to see what it would do.  He does not usually check it at home but since it is high today says he will start checking it at home.  He wonders if the HCTZ component of his medication is causing some of the dizziness.  He also says he usually drinks 2-3 bottles a day of water.  History Russell [name] has a past medical history of adenomatous polyp of colon (03/24/2016), Hyperlipidemia, and Hypertension.   He has a past surgical history that includes Hernia repair; Fracture surgery (Left, age 39); Wisdom tooth extraction; Colonoscopy (2005); Upper gi endoscopy; and Upper gastrointestinal endoscopy.   His family history includes Diabetes in his mother; Heart disease in his mother; Hyperlipidemia in his father.He reports that he has been smoking cigarettes. He has a 56.25 pack-year smoking history. He has never used smokeless tobacco. He reports current alcohol use of about 7.0 standard drinks of alcohol per week. He reports that he does not use drugs.  Current Outpatient Medications on File Prior to Visit  Medication Sig Dispense Refill   Ascorbic  Acid (VITAMIN C PO) Take by mouth.     aspirin EC 81 MG tablet Take 81 mg by mouth every other day.      augmented betamethasone dipropionate (DIPROLENE-AF) 0.05 % cream Apply topically 2 (two) times daily. At affected areas (avoid face and genitals) 50 g 5   Cholecalciferol (VITAMIN D) 2000 units CAPS Take 2,000 Units by mouth daily.     Omega-3 Fatty Acids (FISH OIL) 1000 MG CAPS Take 1,000 mg by mouth.     No current facility-administered medications on file prior to visit.    ROS Review of Systems  Constitutional: Negative for activity change, fatigue and unexpected weight change.  HENT: Negative for congestion, ear pain, hearing loss, postnasal drip and trouble swallowing.   Eyes: Negative for pain and visual disturbance.  Respiratory: Negative for cough, chest tightness and shortness of breath.   Cardiovascular: Negative for chest pain, palpitations and leg swelling.  Gastrointestinal: Negative for abdominal distention, abdominal pain, blood in stool, constipation, diarrhea, nausea and vomiting.  Endocrine: Negative for cold intolerance, heat intolerance and polydipsia.  Genitourinary: Negative for difficulty urinating, dysuria, flank pain, frequency and urgency.  Musculoskeletal: Negative for arthralgias and joint swelling.  Skin: Negative for color change, rash and wound.  Neurological: Positive for light-headedness (See HPI). Negative for dizziness, syncope, speech difficulty, weakness, numbness and headaches.  Hematological: Does not bruise/bleed easily.  Psychiatric/Behavioral: Negative for confusion, decreased concentration, dysphoric mood and sleep disturbance. The patient is  not nervous/anxious.     Objective:  BP (!) 152/78    Pulse 75    Temp (!) 97.4 F (36.3 C) (Temporal)    Resp 20    Ht 5\' 10"  (1.778 m)    Wt 154 lb 8 oz (70.1 kg)    SpO2 99%    BMI 22.17 kg/m   BP Readings from Last 3 Encounters:  02/28/20 (!) 152/78  07/05/19 130/78  11/20/18 135/67     Wt Readings from Last 3 Encounters:  02/28/20 154 lb 8 oz (70.1 kg)  07/05/19 159 lb (72.1 kg)  11/20/18 159 lb (72.1 kg)     Physical Exam Constitutional:      Appearance: He is well-developed.  HENT:     Head: Normocephalic and atraumatic.  Eyes:     Pupils: Pupils are equal, round, and reactive to light.  Neck:     Thyroid: No thyromegaly.     Trachea: No tracheal deviation.  Cardiovascular:     Rate and Rhythm: Normal rate and regular rhythm.     Heart sounds: Normal heart sounds. No murmur heard.  No friction rub. No gallop.   Pulmonary:     Breath sounds: Normal breath sounds. No wheezing or rales.  Abdominal:     General: Bowel sounds are normal. There is no distension.     Palpations: Abdomen is soft. There is no mass.     Tenderness: There is no abdominal tenderness.     Hernia: A hernia is present. Hernia is present in the right inguinal area. There is no hernia in the left inguinal area.  Genitourinary:    Penis: Normal and circumcised.      Testes: Normal.        Right: Tenderness or swelling not present.        Left: Tenderness or swelling not present.     Epididymis:     Right: Normal.     Left: Normal.  Musculoskeletal:        General: Normal range of motion.     Cervical back: Normal range of motion.  Lymphadenopathy:     Cervical: No cervical adenopathy.  Skin:    General: Skin is warm and dry.  Neurological:     Mental Status: He is alert and oriented to person, place, and time.       Assessment & Plan:   Diagnoses and all orders for this visit:  Well adult exam  Essential hypertension  Screening for prostate cancer  Vitamin D deficiency  Hernia, inguinal, right  Orthostasis  Other orders -     olmesartan (BENICAR) 40 MG tablet; Take 1 tablet (40 mg total) by mouth daily. For blood pressure   Allergies as of 02/28/2020   No Known Allergies     Medication List       Accurate as of February 28, 2020 11:51 AM. If you have  any questions, ask your nurse or doctor.        STOP taking these medications   olmesartan-hydrochlorothiazide 20-12.5 MG tablet Commonly known as: BENICAR HCT Stopped by: [name], MD     TAKE these medications   aspirin EC 81 MG tablet Take 81 mg by mouth every other day.   augmented betamethasone dipropionate 0.05 % cream Commonly known as: DIPROLENE-AF Apply topically 2 (two) times daily. At affected areas (avoid face and genitals)   Fish Oil 1000 MG Caps Take 1,000 mg by mouth.   olmesartan 40 MG tablet Commonly known  as: Benicar Take 1 tablet (40 mg total) by mouth daily. For blood pressure Started by: [name], MD   VITAMIN C PO Take by mouth.   Vitamin D 50 MCG (2000 UT) Caps Take 2,000 Units by mouth daily.       Meds ordered this encounter  Medications   olmesartan (BENICAR) 40 MG tablet    Sig: Take 1 tablet (40 mg total) by mouth daily. For blood pressure    Dispense:  90 tablet    Refill:  1    Patient was instructed to increase water consumption to at least 4 bottles approximately 80 ounces a day.  Additionally he is going to monitor the hernia for any bulging aching pain etc.  He prefers not to pursue it with ultrasound or surgical referral at this time.  Follow-up: Return in about 6 months (around 08/29/2020).  [name], M.D.

## 2020-02-29 ENCOUNTER — Other Ambulatory Visit: Payer: Medicare HMO

## 2020-02-29 DIAGNOSIS — Z0000 Encounter for general adult medical examination without abnormal findings: Secondary | ICD-10-CM

## 2020-02-29 DIAGNOSIS — Z125 Encounter for screening for malignant neoplasm of prostate: Secondary | ICD-10-CM | POA: Diagnosis not present

## 2020-03-01 LAB — CBC WITH DIFFERENTIAL/PLATELET
Basophils Absolute: 0.1 10*3/uL (ref 0.0–0.2)
Basos: 1 %
EOS (ABSOLUTE): 0.3 10*3/uL (ref 0.0–0.4)
Eos: 3 %
Hematocrit: 45.7 % (ref 37.5–51.0)
Hemoglobin: 15.1 g/dL (ref 13.0–17.7)
Immature Grans (Abs): 0 10*3/uL (ref 0.0–0.1)
Immature Granulocytes: 0 %
Lymphocytes Absolute: 1.9 10*3/uL (ref 0.7–3.1)
Lymphs: 21 %
MCH: 32.2 pg (ref 26.6–33.0)
MCHC: 33 g/dL (ref 31.5–35.7)
MCV: 97 fL (ref 79–97)
Monocytes Absolute: 0.7 10*3/uL (ref 0.1–0.9)
Monocytes: 7 %
Neutrophils Absolute: 6.3 10*3/uL (ref 1.4–7.0)
Neutrophils: 68 %
Platelets: 223 10*3/uL (ref 150–450)
RBC: 4.69 x10E6/uL (ref 4.14–5.80)
RDW: 12.5 % (ref 11.6–15.4)
WBC: 9.3 10*3/uL (ref 3.4–10.8)

## 2020-03-01 LAB — CMP14+EGFR
ALT: 13 IU/L (ref 0–44)
AST: 18 IU/L (ref 0–40)
Albumin/Globulin Ratio: 2.1 (ref 1.2–2.2)
Albumin: 4.5 g/dL (ref 3.8–4.8)
Alkaline Phosphatase: 56 IU/L (ref 48–121)
BUN/Creatinine Ratio: 16 (ref 10–24)
BUN: 15 mg/dL (ref 8–27)
Bilirubin Total: 0.3 mg/dL (ref 0.0–1.2)
CO2: 25 mmol/L (ref 20–29)
Calcium: 9.2 mg/dL (ref 8.6–10.2)
Chloride: 99 mmol/L (ref 96–106)
Creatinine, Ser: 0.92 mg/dL (ref 0.76–1.27)
GFR calc Af Amer: 98 mL/min/{1.73_m2} (ref >59)
GFR calc non Af Amer: 85 mL/min/{1.73_m2} (ref >59)
Globulin, Total: 2.1 g/dL (ref 1.5–4.5)
Glucose: 92 mg/dL (ref 65–99)
Potassium: 4.6 mmol/L (ref 3.5–5.2)
Sodium: 136 mmol/L (ref 134–144)
Total Protein: 6.6 g/dL (ref 6.0–8.5)

## 2020-03-01 LAB — LIPID PANEL
Chol/HDL Ratio: 2.2 ratio (ref 0.0–5.0)
Cholesterol, Total: 188 mg/dL (ref 100–199)
HDL: 85 mg/dL (ref >39)
LDL Chol Calc (NIH): 94 mg/dL (ref 0–99)
Triglycerides: 44 mg/dL (ref 0–149)
VLDL Cholesterol Cal: 9 mg/dL (ref 5–40)

## 2020-03-01 LAB — PSA, TOTAL AND FREE
PSA, Free Pct: 46 %
PSA, Free: 0.23 ng/mL
Prostate Specific Ag, Serum: 0.5 ng/mL (ref 0.0–4.0)

## 2020-03-02 NOTE — Progress Notes (Signed)
Hello Tylin,  Your lab result is normal and/or stable.Some minor variations that are not significant are commonly marked abnormal, but do not represent any medical problem for you.  Best regards, Herminia Hamzah Savoca, M.D.

## 2020-03-22 DIAGNOSIS — G44219 Episodic tension-type headache, not intractable: Secondary | ICD-10-CM | POA: Diagnosis not present

## 2020-03-22 DIAGNOSIS — H40033 Anatomical narrow angle, bilateral: Secondary | ICD-10-CM | POA: Diagnosis not present

## 2020-06-05 ENCOUNTER — Encounter: Payer: Self-pay | Admitting: *Deleted

## 2020-07-23 ENCOUNTER — Other Ambulatory Visit: Payer: Self-pay

## 2020-07-23 ENCOUNTER — Ambulatory Visit (INDEPENDENT_AMBULATORY_CARE_PROVIDER_SITE_OTHER): Payer: Medicare HMO

## 2020-07-23 DIAGNOSIS — Z23 Encounter for immunization: Secondary | ICD-10-CM

## 2020-07-23 NOTE — Progress Notes (Signed)
   Covid-19 Vaccination Clinic  Name:  LINC RENNE    MRN: [id number] DOB: 09-Dec-1950  07/23/2020  Mr. Cilia was observed post Covid-19 immunization for 15 minutes without incident. He was provided with Vaccine Information Sheet and instruction to access the V-Safe system.   Mr. Tortorella was instructed to call 911 with any severe reactions post vaccine: Marland Kitchen Difficulty breathing  . Swelling of face and throat  . A fast heartbeat  . A bad rash all over body  . Dizziness and weakness   Immunizations Administered    Name Date Dose VIS Date Route   Pfizer COVID-19 Vaccine 07/23/2020 10:02 AM 0.3 mL 06/25/2020 Intramuscular   Manufacturer: [place]   Lot: [id number]   [place]: [id number]

## 2020-08-07 ENCOUNTER — Other Ambulatory Visit: Payer: Self-pay | Admitting: Family Medicine

## 2020-08-26 ENCOUNTER — Ambulatory Visit: Payer: Medicare HMO | Admitting: Family Medicine

## 2020-09-03 ENCOUNTER — Ambulatory Visit (INDEPENDENT_AMBULATORY_CARE_PROVIDER_SITE_OTHER): Payer: Medicare HMO | Admitting: Family Medicine

## 2020-09-03 ENCOUNTER — Encounter: Payer: Self-pay | Admitting: Family Medicine

## 2020-09-03 ENCOUNTER — Other Ambulatory Visit: Payer: Self-pay

## 2020-09-03 VITALS — BP 141/75 | HR 61 | Temp 98.5°F | Ht 70.0 in | Wt 151.6 lb

## 2020-09-03 DIAGNOSIS — I10 Essential (primary) hypertension: Secondary | ICD-10-CM

## 2020-09-03 DIAGNOSIS — Z23 Encounter for immunization: Secondary | ICD-10-CM | POA: Diagnosis not present

## 2020-09-03 DIAGNOSIS — L409 Psoriasis, unspecified: Secondary | ICD-10-CM | POA: Diagnosis not present

## 2020-09-03 DIAGNOSIS — M1712 Unilateral primary osteoarthritis, left knee: Secondary | ICD-10-CM

## 2020-09-03 LAB — CMP14+EGFR
ALT: 16 IU/L (ref 0–44)
AST: 19 IU/L (ref 0–40)
Albumin/Globulin Ratio: 2 (ref 1.2–2.2)
Albumin: 4.3 g/dL (ref 3.8–4.8)
Alkaline Phosphatase: 62 IU/L (ref 44–121)
BUN/Creatinine Ratio: 16 (ref 10–24)
BUN: 17 mg/dL (ref 8–27)
Bilirubin Total: 0.5 mg/dL (ref 0.0–1.2)
CO2: 24 mmol/L (ref 20–29)
Calcium: 9.7 mg/dL (ref 8.6–10.2)
Chloride: 102 mmol/L (ref 96–106)
Creatinine, Ser: 1.06 mg/dL (ref 0.76–1.27)
GFR calc Af Amer: 82 mL/min/{1.73_m2} (ref >59)
GFR calc non Af Amer: 71 mL/min/{1.73_m2} (ref >59)
Globulin, Total: 2.1 g/dL (ref 1.5–4.5)
Glucose: 103 mg/dL — ABNORMAL HIGH (ref 65–99)
Potassium: 5.2 mmol/L (ref 3.5–5.2)
Sodium: 138 mmol/L (ref 134–144)
Total Protein: 6.4 g/dL (ref 6.0–8.5)

## 2020-09-03 LAB — CBC WITH DIFFERENTIAL/PLATELET
Basophils Absolute: 0.1 10*3/uL (ref 0.0–0.2)
Basos: 1 %
EOS (ABSOLUTE): 0.4 10*3/uL (ref 0.0–0.4)
Eos: 4 %
Hematocrit: 46.8 % (ref 37.5–51.0)
Hemoglobin: 16.2 g/dL (ref 13.0–17.7)
Immature Grans (Abs): 0 10*3/uL (ref 0.0–0.1)
Immature Granulocytes: 0 %
Lymphocytes Absolute: 2.5 10*3/uL (ref 0.7–3.1)
Lymphs: 24 %
MCH: 33.2 pg — ABNORMAL HIGH (ref 26.6–33.0)
MCHC: 34.6 g/dL (ref 31.5–35.7)
MCV: 96 fL (ref 79–97)
Monocytes Absolute: 0.9 10*3/uL (ref 0.1–0.9)
Monocytes: 8 %
Neutrophils Absolute: 6.6 10*3/uL (ref 1.4–7.0)
Neutrophils: 63 %
Platelets: 254 10*3/uL (ref 150–450)
RBC: 4.88 x10E6/uL (ref 4.14–5.80)
RDW: 12.6 % (ref 11.6–15.4)
WBC: 10.4 10*3/uL (ref 3.4–10.8)

## 2020-09-03 MED ORDER — BETAMETHASONE DIPROPIONATE AUG 0.05 % EX CREA: TOPICAL_CREAM | Freq: Two times a day (BID) | CUTANEOUS | 3 refills | Status: DC

## 2020-09-03 MED ORDER — OLMESARTAN MEDOXOMIL 40 MG PO TABS: 40.0000 mg | ORAL_TABLET | Freq: Every day | ORAL | 1 refills | Status: DC

## 2020-09-03 MED ORDER — AMLODIPINE BESYLATE 2.5 MG PO TABS: 2.5000 mg | ORAL_TABLET | Freq: Every day | ORAL | 1 refills | Status: DC

## 2020-09-03 NOTE — Progress Notes (Signed)
Subjective:  Patient ID: Russell [name], male    DOB: April 28, 1951  Age: 69 y.o. MRN: [id number]  CC: Hypertension (6 mths rck, no concerns, patient is fasting )   HPI Russell [name] presents for follow-up of hypertension. Patient has no history of headache chest pain or shortness of breath or recent cough. Patient also denies symptoms of TIA such as numbness weakness lateralizing Patient denies side effects from his medication. States taking it regularly. Patient does not check home.  Depression screen [facility] 2/9 09/03/2020 02/28/2020 07/05/2019  Decreased Interest 0 0 0  Down, Depressed, Hopeless 0 0 0  PHQ - 2 Score 0 0 0    History Russell [name] has a past medical history of adenomatous polyp of colon (03/24/2016), Hyperlipidemia, and Hypertension.   Russell [name] has a past surgical history that includes Hernia repair; Fracture surgery (Left, age 66); Wisdom tooth extraction; Colonoscopy (2005); Upper gi endoscopy; and Upper gastrointestinal endoscopy.   His family history includes Diabetes in his mother; Heart disease in his mother; Hyperlipidemia in his father.Russell [name] reports that Russell [name] has been smoking cigarettes. Russell [name] has a 56.25 pack-year smoking history. Russell [name] has never used smokeless tobacco. Russell [name] reports current alcohol use of about 7.0 standard drinks of alcohol per week. Russell [name] reports that Russell [name] does not use drugs.    ROS Review of Systems  Constitutional: Negative for fever.  Respiratory: Negative for shortness of breath.   Cardiovascular: Negative for chest pain.  Musculoskeletal: Negative for arthralgias.  Skin: Negative for rash.    Objective:  BP (!) 141/75   Pulse 61   Temp 98.5 F (36.9 C) (Temporal)   Ht '5\' 10"'  (1.778 m)   Wt 151 lb 9.6 oz (68.8 kg)   BMI 21.75 kg/m   BP Readings from Last 3 Encounters:  09/03/20 (!) 141/75  02/28/20 (!) 152/78  07/05/19 130/78    Wt Readings from Last 3 Encounters:  09/03/20 151 lb 9.6 oz (68.8 kg)  02/28/20 154 lb 8 oz (70.1 kg)  07/05/19 159 lb (72.1  kg)     Physical Exam Vitals reviewed.  Constitutional:      Appearance: Russell [name] is well-developed and well-nourished.  HENT:     Head: Normocephalic and atraumatic.     Right Ear: Tympanic membrane and external ear normal. No decreased hearing noted.     Left Ear: Tympanic membrane and external ear normal. No decreased hearing noted.     Mouth/Throat:     Pharynx: No oropharyngeal exudate or posterior oropharyngeal erythema.  Eyes:     Pupils: Pupils are equal, round, and reactive to light.  Cardiovascular:     Rate and Rhythm: Normal rate and regular rhythm.     Heart sounds: No murmur heard.   Pulmonary:     Effort: No respiratory distress.     Breath sounds: Normal breath sounds.  Abdominal:     General: Bowel sounds are normal.     Palpations: Abdomen is soft. There is no mass.     Tenderness: There is no abdominal tenderness.  Musculoskeletal:     Cervical back: Normal range of motion and neck supple.       Assessment & Plan:   Russell [name] was seen today for hypertension.  Diagnoses and all orders for this visit:  Primary hypertension -     CBC with Differential/Platelet -     CMP14+EGFR  Need for immunization against influenza -     Flu Vaccine QUAD High Dose(Fluad)  Unilateral primary osteoarthritis, left  knee -     CBC with Differential/Platelet -     CMP14+EGFR  Psoriasis -     CBC with Differential/Platelet -     CMP14+EGFR  Other orders -     olmesartan (BENICAR) 40 MG tablet; Take 1 tablet (40 mg total) by mouth daily. For blood pressure -     augmented betamethasone dipropionate (DIPROLENE-AF) 0.05 % cream; Apply topically 2 (two) times daily. At affected areas (avoid face and genitals) -     amLODipine (NORVASC) 2.5 MG tablet; Take 1 tablet (2.5 mg total) by mouth daily.       I have changed Russell [name]'s olmesartan. I am also having him start on amLODipine. Additionally, I am having him maintain his aspirin EC, Fish Oil, Vitamin D, Ascorbic  Acid (VITAMIN C PO), and augmented betamethasone dipropionate.  Allergies as of 09/03/2020   No Known Allergies     Medication List       Accurate as of September 03, 2020  8:35 PM. If you have any questions, ask your nurse or doctor.        amLODipine 2.5 MG tablet Commonly known as: NORVASC Take 1 tablet (2.5 mg total) by mouth daily. Started by: [name], MD   aspirin EC 81 MG tablet Take 81 mg by mouth every other day.   augmented betamethasone dipropionate 0.05 % cream Commonly known as: DIPROLENE-AF Apply topically 2 (two) times daily. At affected areas (avoid face and genitals)   Fish Oil 1000 MG Caps Take 1,000 mg by mouth.   olmesartan 40 MG tablet Commonly known as: BENICAR Take 1 tablet (40 mg total) by mouth daily. For blood pressure   VITAMIN C PO Take by mouth.   Vitamin D 50 MCG (2000 UT) Caps Take 2,000 Units by mouth daily.        Follow-up: Return in about 1 month (around 10/04/2020).  [name], M.D.

## 2020-09-07 NOTE — Progress Notes (Signed)
Hello Warrick,  Your lab result is normal and/or stable.Some minor variations that are not significant are commonly marked abnormal, but do not represent any medical problem for you.  Best regards, Jsiah Menta, M.D.

## 2020-10-15 DIAGNOSIS — L239 Allergic contact dermatitis, unspecified cause: Secondary | ICD-10-CM | POA: Diagnosis not present

## 2020-12-06 DIAGNOSIS — S51811A Laceration without foreign body of right forearm, initial encounter: Secondary | ICD-10-CM | POA: Diagnosis not present

## 2020-12-08 DIAGNOSIS — L258 Unspecified contact dermatitis due to other agents: Secondary | ICD-10-CM | POA: Diagnosis not present

## 2020-12-16 DIAGNOSIS — K006 Disturbances in tooth eruption: Secondary | ICD-10-CM | POA: Diagnosis not present

## 2021-01-02 DIAGNOSIS — H2513 Age-related nuclear cataract, bilateral: Secondary | ICD-10-CM | POA: Diagnosis not present

## 2021-01-02 DIAGNOSIS — H40033 Anatomical narrow angle, bilateral: Secondary | ICD-10-CM | POA: Diagnosis not present

## 2021-02-23 ENCOUNTER — Other Ambulatory Visit: Payer: Self-pay | Admitting: Family Medicine

## 2021-02-24 ENCOUNTER — Other Ambulatory Visit: Payer: Self-pay | Admitting: *Deleted

## 2021-02-24 MED ORDER — OLMESARTAN MEDOXOMIL 40 MG PO TABS: 40.0000 mg | ORAL_TABLET | Freq: Every day | ORAL | 0 refills | Status: DC

## 2021-03-04 ENCOUNTER — Telehealth: Payer: Self-pay | Admitting: Internal Medicine

## 2021-03-04 ENCOUNTER — Encounter: Payer: Self-pay | Admitting: Internal Medicine

## 2021-03-04 NOTE — Telephone Encounter (Signed)
Inbound call from patient to schedule colonoscopy. The recall states 2020 but looking at the reports states postpone till 08/2021. Could you confirm the date for recall?

## 2021-03-04 NOTE — Telephone Encounter (Signed)
Recall is correct.  He is due now.  Please assist in scheduling patient for colonoscopy for hx of polyps

## 2021-03-05 ENCOUNTER — Ambulatory Visit (INDEPENDENT_AMBULATORY_CARE_PROVIDER_SITE_OTHER): Payer: Medicare HMO | Admitting: Family Medicine

## 2021-03-05 ENCOUNTER — Encounter: Payer: Self-pay | Admitting: Family Medicine

## 2021-03-05 ENCOUNTER — Other Ambulatory Visit: Payer: Self-pay

## 2021-03-05 VITALS — BP 146/64 | HR 52 | Temp 98.2°F | Ht 70.0 in | Wt 147.2 lb

## 2021-03-05 DIAGNOSIS — Z0000 Encounter for general adult medical examination without abnormal findings: Secondary | ICD-10-CM

## 2021-03-05 DIAGNOSIS — F17200 Nicotine dependence, unspecified, uncomplicated: Secondary | ICD-10-CM

## 2021-03-05 DIAGNOSIS — R5383 Other fatigue: Secondary | ICD-10-CM | POA: Diagnosis not present

## 2021-03-05 DIAGNOSIS — Z122 Encounter for screening for malignant neoplasm of respiratory organs: Secondary | ICD-10-CM

## 2021-03-05 DIAGNOSIS — I10 Essential (primary) hypertension: Secondary | ICD-10-CM

## 2021-03-05 DIAGNOSIS — Z0001 Encounter for general adult medical examination with abnormal findings: Secondary | ICD-10-CM | POA: Diagnosis not present

## 2021-03-05 MED ORDER — AMLODIPINE BESYLATE 5 MG PO TABS: 5.0000 mg | ORAL_TABLET | Freq: Every day | ORAL | 3 refills | Status: DC

## 2021-03-05 MED ORDER — OLMESARTAN MEDOXOMIL 40 MG PO TABS: 40.0000 mg | ORAL_TABLET | Freq: Every day | ORAL | 3 refills | Status: DC

## 2021-03-05 NOTE — Patient Instructions (Addendum)
Use diclofenac gel up to 4 times a day to relieve pain in the fingerSmoking Tobacco Information, Adult Smoking tobacco can be harmful to your health. Tobacco contains a poisonous (toxic), colorless chemical called nicotine. Nicotine is addictive. It changes the brain and can make it hard to stop smoking. Tobacco also has other toxicchemicals that can hurt your body and raise your risk of many cancers. How can smoking tobacco affect me? Smoking tobacco puts you at risk for: Cancer. Smoking is most commonly associated with lung cancer, but can also lead to cancer in other parts of the body. Chronic obstructive pulmonary disease (COPD). This is a long-term lung condition that makes it hard to breathe. It also gets worse over time. High blood pressure (hypertension), heart disease, stroke, or heart attack. Lung infections, such as pneumonia. Cataracts. This is when the lenses in the eyes become clouded. Digestive problems. This may include peptic ulcers, heartburn, and gastroesophageal reflux disease (GERD). Oral health problems, such as gum disease and tooth loss. Loss of taste and smell. Smoking can affect your appearance by causing: Wrinkles. Yellow or stained teeth, fingers, and fingernails. Smoking tobacco can also affect your social life, because: It may be challenging to find places to smoke when away from home. Many workplaces, [employer], hotels, and public places are tobacco-free. Smoking is expensive. This is due to the cost of tobacco and the long-term costs of treating health problems from smoking. Secondhand smoke may affect those around you. Secondhand smoke can cause lung cancer, breathing problems, and heart disease. Children of smokers have a higher risk for: Sudden infant death syndrome (SIDS). Ear infections. Lung infections. If you currently smoke tobacco, quitting now can help you: Lead a longer and healthier life. Look, smell, breathe, and feel better over time. Save  money. Protect others from the harms of secondhand smoke. What actions can I take to prevent health problems? Quit smoking  Do not start smoking. Quit if you already do. Make a plan to quit smoking and commit to it. Look for programs to help you and ask your health care provider for recommendations and ideas. Set a date and write down all the reasons you want to quit. Let your friends and family know you are quitting so they can help and support you. Consider finding friends who also want to quit. It can be easier to quit with someone else, so that you can support each other. Talk with your health care provider about using nicotine replacement medicines to help you quit, such as gum, lozenges, patches, sprays, or pills. Do not replace cigarette smoking with electronic cigarettes, which are commonly called e-cigarettes. The safety of e-cigarettes is not known, and some may contain harmful chemicals. If you try to quit but return to smoking, stay positive. It is common to slip up when you first quit, so take it one day at a time. Be prepared for cravings. When you feel the urge to smoke, chew gum or suck on hard candy.  Lifestyle Stay busy and take care of your body. Drink enough fluid to keep your urine pale yellow. Get plenty of exercise and eat a healthy diet. This can help prevent weight gain after quitting. Monitor your eating habits. Quitting smoking can cause you to have a larger appetite than when you smoke. Find ways to relax. Go out with friends or family to a movie or a restaurant where people do not smoke. Ask your health care provider about having regular tests (screenings) to check for cancer.  This may include blood tests, imaging tests, and other tests. Find ways to manage your stress, such as meditation, yoga, or exercise. Where to find support To get support to quit smoking, consider: Asking your health care provider for more information and resources. Taking classes to learn  more about quitting smoking. Looking for local organizations that offer resources about quitting smoking. Joining a support group for people who want to quit smoking in your local community. Calling the smokefree.gov counselor helpline: 1-800-Quit-Now 660 488 1092) Where to find more information You may find more information about quitting smoking from: HelpGuide.org: www.helpguide.org https://hall.com/: smokefree.gov American Lung Association: www.lung.org Contact a health care provider if you: Have problems breathing. Notice that your lips, nose, or fingers turn blue. Have chest pain. Are coughing up blood. Feel faint or you pass out. Have other health changes that cause you to worry. Summary Smoking tobacco can negatively affect your health, the health of those around you, your finances, and your social life. Do not start smoking. Quit if you already do. If you need help quitting, ask your health care provider. Think about joining a support group for people who want to quit smoking in your local community. There are many effective programs that will help you to quit this behavior. This information is not intended to replace advice given to you by your health care provider. Make sure you discuss any questions you have with your healthcare provider. Document Revised: 07/15/2020 Document Reviewed: 07/15/2020 Elsevier Patient Education  2022 [employer].

## 2021-03-05 NOTE — Progress Notes (Signed)
Subjective:  Patient ID: Russell [name], male    DOB: Aug 22, 1951  Age: 70 y.o. MRN: [id number]  CC: Annual Exam   HPI AMOUR CUTRONE presents for Annual physical.    presents for  follow-up of hypertension. Patient has no history of headache chest pain or shortness of breath or recent cough. Patient also denies symptoms of TIA such as focal numbness or weakness. Patient denies side effects from medication. States taking it regularly.  Pt. Has been smoking 50 + years at over 1 pack daily  Depression screen [facility] 2/9 03/05/2021 03/05/2021 09/03/2020  Decreased Interest 0 0 0  Down, Depressed, Hopeless 0 0 0  PHQ - 2 Score 0 0 0  Altered sleeping 0 - -  Tired, decreased energy 1 - -  Change in appetite 1 - -  Feeling bad or failure about yourself  0 - -  Trouble concentrating 0 - -  Moving slowly or fidgety/restless 0 - -  Suicidal thoughts 0 - -  PHQ-9 Score 2 - -  Difficult doing work/chores Not difficult at all - -    History Silvino has a past medical history of adenomatous polyp of colon (03/24/2016), Hyperlipidemia, and Hypertension.   He has a past surgical history that includes Hernia repair; Fracture surgery (Left, age 76); Wisdom tooth extraction; Colonoscopy (2005); Upper gi endoscopy; and Upper gastrointestinal endoscopy.   His family history includes Diabetes in his mother; Heart disease in his mother; Hyperlipidemia in his father.He reports that he has been smoking cigarettes. He has a 56.25 pack-year smoking history. He has never used smokeless tobacco. He reports current alcohol use of about 7.0 standard drinks of alcohol per week. He reports that he does not use drugs.    ROS Review of Systems  Constitutional:  Negative for activity change, fatigue and unexpected weight change.  HENT:  Negative for congestion, ear pain, hearing loss, postnasal drip and trouble swallowing.   Eyes:  Negative for pain and visual disturbance.  Respiratory:  Negative for cough, chest  tightness and shortness of breath.   Cardiovascular:  Negative for chest pain, palpitations and leg swelling.  Gastrointestinal:  Negative for abdominal distention, abdominal pain, blood in stool, constipation, diarrhea, nausea and vomiting.  Endocrine: Negative for cold intolerance, heat intolerance and polydipsia.  Genitourinary:  Negative for difficulty urinating, dysuria, flank pain, frequency and urgency.  Musculoskeletal:  Negative for arthralgias and joint swelling.  Skin:  Negative for color change, rash and wound.  Neurological:  Negative for dizziness, syncope, speech difficulty, weakness, light-headedness, numbness and headaches.  Hematological:  Does not bruise/bleed easily.  Psychiatric/Behavioral:  Negative for confusion, decreased concentration, dysphoric mood and sleep disturbance. The patient is not nervous/anxious.    Objective:  BP (!) 146/64   Pulse (!) 52   Temp 98.2 F (36.8 C)   Ht '5\' 10"'  (1.778 m)   Wt 147 lb 3.2 oz (66.8 kg)   SpO2 98%   BMI 21.12 kg/m   BP Readings from Last 3 Encounters:  03/05/21 (!) 146/64  09/03/20 (!) 141/75  02/28/20 (!) 152/78    Wt Readings from Last 3 Encounters:  03/05/21 147 lb 3.2 oz (66.8 kg)  09/03/20 151 lb 9.6 oz (68.8 kg)  02/28/20 154 lb 8 oz (70.1 kg)     Physical Exam Constitutional:      Appearance: He is well-developed.  HENT:     Head: Normocephalic and atraumatic.  Eyes:     Pupils: Pupils are equal, round,  and reactive to light.  Neck:     Thyroid: No thyromegaly.     Trachea: No tracheal deviation.  Cardiovascular:     Rate and Rhythm: Normal rate and regular rhythm.     Heart sounds: Normal heart sounds. No murmur heard.   No friction rub. No gallop.  Pulmonary:     Breath sounds: Normal breath sounds. No wheezing or rales.  Abdominal:     General: Bowel sounds are normal. There is no distension.     Palpations: Abdomen is soft. There is no mass.     Tenderness: There is no abdominal  tenderness.     Hernia: There is no hernia in the left inguinal area.  Genitourinary:    Penis: Normal.      Testes: Normal.  Musculoskeletal:        General: Normal range of motion.     Cervical back: Normal range of motion.  Lymphadenopathy:     Cervical: No cervical adenopathy.  Skin:    General: Skin is warm and dry.  Neurological:     Mental Status: He is alert and oriented to person, place, and time.      Assessment & Plan:   Ector was seen today for annual exam.  Diagnoses and all orders for this visit:  Physical exam, annual -     CBC with Differential/Platelet -     CMP14+EGFR -     Lipid panel -     PSA, total and free  Primary hypertension  Encounter for screening for malignant neoplasm of lung in current smoker with 30 pack year history or greater -     CT CHEST LUNG CANCER SCREENING LOW DOSE WO CONTRAST; Future  Fatigue, unspecified type -     TSH  Other orders -     olmesartan (BENICAR) 40 MG tablet; Take 1 tablet (40 mg total) by mouth daily. For blood pressure -     amLODipine (NORVASC) 5 MG tablet; Take 1 tablet (5 mg total) by mouth daily. For blood pressure      I have discontinued Arlyn C. Rainone's amLODipine. I am also having him start on amLODipine. Additionally, I am having him maintain his aspirin EC, Fish Oil, Vitamin D, Ascorbic Acid (VITAMIN C PO), augmented betamethasone dipropionate, and olmesartan.  Allergies as of 03/05/2021   No Known Allergies      Medication List        Accurate as of March 05, 2021 11:59 PM. If you have any questions, ask your nurse or doctor.          amLODipine 5 MG tablet Commonly known as: NORVASC Take 1 tablet (5 mg total) by mouth daily. For blood pressure What changed:  medication strength how much to take additional instructions Changed by: [name], MD   aspirin EC 81 MG tablet Take 81 mg by mouth every other day.   augmented betamethasone dipropionate 0.05 % cream Commonly known  as: DIPROLENE-AF Apply topically 2 (two) times daily. At affected areas (avoid face and genitals)   Fish Oil 1000 MG Caps Take 1,000 mg by mouth.   olmesartan 40 MG tablet Commonly known as: BENICAR Take 1 tablet (40 mg total) by mouth daily. For blood pressure   VITAMIN C PO Take by mouth.   Vitamin D 50 MCG (2000 UT) Caps Take 2,000 Units by mouth daily.         Follow-up: No follow-ups on file.  [name], M.D.

## 2021-03-06 ENCOUNTER — Encounter: Payer: Self-pay | Admitting: Family Medicine

## 2021-03-06 LAB — CBC WITH DIFFERENTIAL/PLATELET
Basophils Absolute: 0.1 10*3/uL (ref 0.0–0.2)
Basos: 1 %
EOS (ABSOLUTE): 0.2 10*3/uL (ref 0.0–0.4)
Eos: 3 %
Hematocrit: 48 % (ref 37.5–51.0)
Hemoglobin: 15.6 g/dL (ref 13.0–17.7)
Immature Grans (Abs): 0 10*3/uL (ref 0.0–0.1)
Immature Granulocytes: 0 %
Lymphocytes Absolute: 1.9 10*3/uL (ref 0.7–3.1)
Lymphs: 20 %
MCH: 32.2 pg (ref 26.6–33.0)
MCHC: 32.5 g/dL (ref 31.5–35.7)
MCV: 99 fL — ABNORMAL HIGH (ref 79–97)
Monocytes Absolute: 0.8 10*3/uL (ref 0.1–0.9)
Monocytes: 9 %
Neutrophils Absolute: 6.6 10*3/uL (ref 1.4–7.0)
Neutrophils: 67 %
Platelets: 221 10*3/uL (ref 150–450)
RBC: 4.85 x10E6/uL (ref 4.14–5.80)
RDW: 12 % (ref 11.6–15.4)
WBC: 9.7 10*3/uL (ref 3.4–10.8)

## 2021-03-06 LAB — CMP14+EGFR
ALT: 13 IU/L (ref 0–44)
AST: 17 IU/L (ref 0–40)
Albumin/Globulin Ratio: 2.6 — ABNORMAL HIGH (ref 1.2–2.2)
Albumin: 4.7 g/dL (ref 3.8–4.8)
Alkaline Phosphatase: 65 IU/L (ref 44–121)
BUN/Creatinine Ratio: 11 (ref 10–24)
BUN: 11 mg/dL (ref 8–27)
Bilirubin Total: 0.5 mg/dL (ref 0.0–1.2)
CO2: 23 mmol/L (ref 20–29)
Calcium: 9.5 mg/dL (ref 8.6–10.2)
Chloride: 100 mmol/L (ref 96–106)
Creatinine, Ser: 1.02 mg/dL (ref 0.76–1.27)
Globulin, Total: 1.8 g/dL (ref 1.5–4.5)
Glucose: 87 mg/dL (ref 65–99)
Potassium: 4.9 mmol/L (ref 3.5–5.2)
Sodium: 139 mmol/L (ref 134–144)
Total Protein: 6.5 g/dL (ref 6.0–8.5)
eGFR: 79 mL/min/{1.73_m2} (ref >59)

## 2021-03-06 LAB — LIPID PANEL
Chol/HDL Ratio: 2.1 ratio (ref 0.0–5.0)
Cholesterol, Total: 199 mg/dL (ref 100–199)
HDL: 94 mg/dL (ref >39)
LDL Chol Calc (NIH): 98 mg/dL (ref 0–99)
Triglycerides: 37 mg/dL (ref 0–149)
VLDL Cholesterol Cal: 7 mg/dL (ref 5–40)

## 2021-03-06 LAB — PSA, TOTAL AND FREE
PSA, Free Pct: 35 %
PSA, Free: 0.21 ng/mL
Prostate Specific Ag, Serum: 0.6 ng/mL (ref 0.0–4.0)

## 2021-03-06 LAB — TSH: TSH: 1.17 u[IU]/mL (ref 0.450–4.500)

## 2021-03-06 NOTE — Progress Notes (Signed)
Hello Russell [name],  Your lab result is normal and/or stable.Some minor variations that are not significant are commonly marked abnormal, but do not represent any medical problem for you.  Best regards, Camp Gopal, M.D.

## 2021-03-16 ENCOUNTER — Encounter (HOSPITAL_COMMUNITY): Payer: Self-pay

## 2021-03-16 ENCOUNTER — Other Ambulatory Visit (HOSPITAL_COMMUNITY): Payer: Self-pay

## 2021-03-16 DIAGNOSIS — Z87891 Personal history of nicotine dependence: Secondary | ICD-10-CM

## 2021-03-16 DIAGNOSIS — Z122 Encounter for screening for malignant neoplasm of respiratory organs: Secondary | ICD-10-CM

## 2021-03-16 NOTE — Progress Notes (Signed)
Received referral for initial lung cancer screening scan. Contacted patient and obtained smoking history (started age 70 smoking about a 1/2PPD until the age of 64 when he increased to 1PPD, current smoker that continues to smoke 1PPD, 49.5 pack year) as well as answering questions related to the screening process. Patient denies signs/symptoms of lung cancer such as weight loss or hemoptysis. Patient denies comorbidity that would prevent curative treatment if lung cancer were to be found.  Patient is scheduled for shared decision making visit and CT scan on 04/07/2021 at 1115.

## 2021-04-06 DIAGNOSIS — J441 Chronic obstructive pulmonary disease with (acute) exacerbation: Secondary | ICD-10-CM | POA: Diagnosis not present

## 2021-04-06 DIAGNOSIS — R062 Wheezing: Secondary | ICD-10-CM | POA: Diagnosis not present

## 2021-04-06 DIAGNOSIS — R059 Cough, unspecified: Secondary | ICD-10-CM | POA: Diagnosis not present

## 2021-04-06 DIAGNOSIS — I493 Ventricular premature depolarization: Secondary | ICD-10-CM | POA: Diagnosis not present

## 2021-04-06 DIAGNOSIS — R001 Bradycardia, unspecified: Secondary | ICD-10-CM | POA: Diagnosis not present

## 2021-04-06 NOTE — Progress Notes (Signed)
Hedy Camara  Visit Date: 04/07/21  Visit Type: In-Person at [place] SHARED DECISION-MAKING VISIT - Age: 70 y.o.  - Pack year smoking history: 49.5 pack-years - Type of tobacco abuse: Cigarettes - Current smoker or < 15 years of cessation: Current smoker, 1 PPD - No current symptoms of lung cancer:   Patient denies any hemoptysis, unintentional weight loss, and unexplained cough - Risks and benefits of lung cancer screening discussed: Negative: Over-diagnosis, radiation exposure, false positives, and additional testing Positive: Discover early stage lung cancer resulting in higher incidence of cure - Patient educated regarding the importance of adherence to continued lung cancer screening. - Currently, there are no co-morbidities to prevent treatment to therapy for lung cancer and the patient is agreeable to pursue treatment if a malignancy is discovered.  [place] Preventative Services Task Force recommend annual screening for lung cancer with low-dose CT in adults aged 24 - 33 years who have a 20+ pack year smoking history and currently smoke or have quit smoking within the past 15 years.  Screening should be discontinued once a person has not smoked for 15 years or develops a health problem that substantially limits life expectancy or the ability or willingness to have curative lung surgery.  It is a category B recommendation.  Similar stances are provided by CMS, NCCN, and AATS.  Social History   Tobacco Use   Smoking status: Every Day    Packs/day: 1.25    Years: 45.00    Pack years: 56.25    Types: Cigarettes   Smokeless tobacco: Never  Vaping Use   Vaping Use: Never used  Substance Use Topics   Alcohol use: Yes    Alcohol/week: 7.0 standard drinks    Types: 7 Shots of liquor per week   Drug use: No     Personal history of tobacco use presenting hazards to health: - This patient meets criteria for low-dose CT lung cancer screening  - The  shared decision making visit discussion included risks and benefits of screening, potential for follow-up, diagnostic testing for abnormal scans, potential for false positive tests, overdiagnosis, discussion about total radiation exposure - Patient stated willingness to undergo diagnostics and treatment as needed - Patient was counseled on smoking cessation to decrease the  risk of lung cancer, pulmonary disease, heart disease, and stroke - Patient has been referred to Lung Cancer Screening Nurse Coordinator for further scheduling of LDCT and for further resources regarding free nicotine replacement therapy and information about smoking cessation classes - Counseling on the importance of adherence to annual lung cancer LDCT screening, impact of co-morbidities, and ability or willingness to undergo diagnosis and treatment is imperative for compliance of the program. - Counseling on the importance of continued smoking cessation for former smokers; the importance of smoking cessation for current smokers, and information about tobacco cessation interventions have been given to patient including [place] and 1-800-quit [place] programs.  Yearly follow up will be coordinated by Adonis Huguenin, RN, MSN [facility] Nurse Navigator.)  Harriett Rush, PA-C  04/07/21 11:43 AM

## 2021-04-07 ENCOUNTER — Encounter (HOSPITAL_COMMUNITY): Payer: Self-pay

## 2021-04-07 ENCOUNTER — Other Ambulatory Visit: Payer: Self-pay

## 2021-04-07 ENCOUNTER — Inpatient Hospital Stay (HOSPITAL_COMMUNITY): Payer: Medicare HMO | Attending: Physician Assistant | Admitting: Physician Assistant

## 2021-04-07 ENCOUNTER — Ambulatory Visit (HOSPITAL_COMMUNITY)
Admission: RE | Admit: 2021-04-07 | Discharge: 2021-04-07 | Disposition: A | Discharge status: Home / Self Care | Payer: Medicare HMO | Source: Ambulatory Visit | Attending: Physician Assistant | Admitting: Physician Assistant

## 2021-04-07 DIAGNOSIS — Z87891 Personal history of nicotine dependence: Secondary | ICD-10-CM

## 2021-04-07 DIAGNOSIS — Z122 Encounter for screening for malignant neoplasm of respiratory organs: Secondary | ICD-10-CM

## 2021-04-07 NOTE — Patient Instructions (Signed)
You were seen today for your Lung Cancer Screening shared-decision making visit and low-dose CT scan. Thank you for your participation in this lifesaving program!

## 2021-05-21 ENCOUNTER — Other Ambulatory Visit: Payer: Self-pay

## 2021-05-21 ENCOUNTER — Ambulatory Visit (AMBULATORY_SURGERY_CENTER): Payer: Self-pay

## 2021-05-21 VITALS — Ht 70.0 in | Wt 150.0 lb

## 2021-05-21 DIAGNOSIS — Z86010 Personal history of colonic polyps: Secondary | ICD-10-CM

## 2021-05-21 NOTE — Progress Notes (Signed)
No egg or soy allergy known to patient  No issues known to pt with past sedation with any surgeries or procedures Patient denies ever being told they had issues or difficulty with intubation  No FH of Malignant Hyperthermia Pt is not on diet pills Pt is not on  home 02  Pt is not on blood thinners  Pt denies issues with constipation  No A fib or A flutter  EMMI video to pt or via [place] 19 guidelines implemented in PV today with Pt and RN   Pt is fully vaccinated  for Covid    Due to the COVID-19 pandemic we are asking patients to follow certain guidelines.  Pt aware of COVID protocols and [facility] guidelines

## 2021-05-22 ENCOUNTER — Encounter: Payer: Self-pay | Admitting: Internal Medicine

## 2021-06-04 ENCOUNTER — Encounter: Payer: Self-pay | Admitting: Internal Medicine

## 2021-06-04 ENCOUNTER — Other Ambulatory Visit: Payer: Self-pay

## 2021-06-04 ENCOUNTER — Ambulatory Visit (AMBULATORY_SURGERY_CENTER): Payer: Medicare HMO | Admitting: Internal Medicine

## 2021-06-04 VITALS — BP 110/65 | HR 51 | Temp 98.4°F | Resp 16 | Ht 70.0 in | Wt 150.0 lb

## 2021-06-04 DIAGNOSIS — I10 Essential (primary) hypertension: Secondary | ICD-10-CM | POA: Diagnosis not present

## 2021-06-04 DIAGNOSIS — Z86010 Personal history of colonic polyps: Secondary | ICD-10-CM

## 2021-06-04 MED ORDER — SODIUM CHLORIDE 0.9 % IV SOLN: 500.0000 mL | INTRAVENOUS | Status: DC

## 2021-06-04 NOTE — Progress Notes (Signed)
Vss nad transferred to pacu 

## 2021-06-04 NOTE — Patient Instructions (Addendum)
No polyps today!  Repeat colonoscopy as needed - not routine. I appreciate the opportunity to care for you. Gatha Mayer, MD, [facility]    Handouts were given to your care partner on diverticulosis and hemorrhoids. You may resume your current medications today. No repeat colonoscopy due to age and the absence of colonic polyps. Please call if any questions or concerns.          YOU HAD AN ENDOSCOPIC PROCEDURE TODAY AT [facility]:   Refer to the procedure report that was given to you for any specific questions about what was found during the examination.  If the procedure report does not answer your questions, please call your gastroenterologist to clarify.  If you requested that your care partner not be given the details of your procedure findings, then the procedure report has been included in a sealed envelope for you to review at your convenience later.  YOU SHOULD EXPECT: Some feelings of bloating in the abdomen. Passage of more gas than usual.  Walking can help get rid of the air that was put into your GI tract during the procedure and reduce the bloating. If you had a lower endoscopy (such as a colonoscopy or flexible sigmoidoscopy) you may notice spotting of blood in your stool or on the toilet paper. If you underwent a bowel prep for your procedure, you may not have a normal bowel movement for a few days.  Please Note:  You might notice some irritation and congestion in your nose or some drainage.  This is from the oxygen used during your procedure.  There is no need for concern and it should clear up in a day or so.  SYMPTOMS TO REPORT IMMEDIATELY:  Following lower endoscopy (colonoscopy or flexible sigmoidoscopy):  Excessive amounts of blood in the stool  Significant tenderness or worsening of abdominal pains  Swelling of the abdomen that is new, acute  Fever of 100F or higher   For urgent or emergent issues, a gastroenterologist can be reached at any hour  by calling [phone]. Do not use MyChart messaging for urgent concerns.    DIET:  We do recommend a small meal at first, but then you may proceed to your regular diet.  Drink plenty of fluids but you should avoid alcoholic beverages for 24 hours.  ACTIVITY:  You should plan to take it easy for the rest of today and you should NOT DRIVE or use heavy machinery until tomorrow (because of the sedation medicines used during the test).    FOLLOW UP: Our staff will call the number listed on your records 48-72 hours following your procedure to check on you and address any questions or concerns that you may have regarding the information given to you following your procedure. If we do not reach you, we will leave a message.  We will attempt to reach you two times.  During this call, we will ask if you have developed any symptoms of COVID 19. If you develop any symptoms (ie: fever, flu-like symptoms, shortness of breath, cough etc.) before then, please call [phone].  If you test positive for Covid 19 in the 2 weeks post procedure, please call and report this information to [place].    If any biopsies were taken you will be contacted by phone or by letter within the next 1-3 weeks.  Please call us at [phone] if you have not heard about the biopsies in 3 weeks.    SIGNATURES/CONFIDENTIALITY: You and/or  your care partner have signed paperwork which will be entered into your electronic medical record.  These signatures attest to the fact that that the information above on your After Visit Summary has been reviewed and is understood.  Full responsibility of the confidentiality of this discharge information lies with you and/or your care-partner.

## 2021-06-04 NOTE — Progress Notes (Signed)
Pt's states no medical or surgical changes since previsit or office visit. 

## 2021-06-04 NOTE — Progress Notes (Signed)
No problems noted in the recovery room. maw 

## 2021-06-04 NOTE — Op Note (Signed)
[facility] Patient Name: Russell [name] Procedure Date: 06/04/2021 10:30 AM MRN: [id number] Endoscopist: Gatha Mayer , MD Age: 70 Referring MD:  Date of Birth: 10/18/1950 Gender: Male Account #: [id number] Procedure:                Colonoscopy Indications:              Surveillance: Personal history of adenomatous                            polyps on last colonoscopy 5 years ago Medicines:                Monitored Anesthesia Care Procedure:                Pre-Anesthesia Assessment:                           - Prior to the procedure, a History and Physical                            was performed, and patient medications and                            allergies were reviewed. The patient's tolerance of                            previous anesthesia was also reviewed. The risks                            and benefits of the procedure and the sedation                            options and risks were discussed with the patient.                            All questions were answered, and informed consent                            was obtained. Prior Anticoagulants: The patient has                            taken no previous anticoagulant or antiplatelet                            agents. ASA Grade Assessment: II - A patient with                            mild systemic disease. After reviewing the risks                            and benefits, the patient was deemed in                            satisfactory condition to undergo the procedure.  After obtaining informed consent, the colonoscope                            was passed under direct vision. Throughout the                            procedure, the patient's blood pressure, pulse, and                            oxygen saturations were monitored continuously. The                            CF [id number] #[id number] was introduced through the anus                            and advanced to the the  cecum, identified by                            appendiceal orifice and ileocecal valve. The                            colonoscopy was performed without difficulty. The                            patient tolerated the procedure well. The quality                            of the bowel preparation was good. The ileocecal                            valve, appendiceal orifice, and rectum were                            photographed. Scope In: 10:49:54 AM Scope Out: 11:02:19 AM Scope Withdrawal Time: 0 hours 8 minutes 11 seconds  Total Procedure Duration: 0 hours 12 minutes 25 seconds  Findings:                 The perianal and digital rectal examinations were                            normal.                           Multiple small and large-mouthed diverticula were                            found in the sigmoid colon, descending colon,                            transverse colon and ascending colon.                           Internal hemorrhoids were found.  The exam was otherwise without abnormality on                            direct and retroflexion views. Complications:            No immediate complications. Estimated Blood Loss:     Estimated blood loss: none. Impression:               - Diverticulosis in the sigmoid colon, in the                            descending colon, in the transverse colon and in                            the ascending colon.                           - Internal hemorrhoids.                           - The examination was otherwise normal on direct                            and retroflexion views.                           - No specimens collected. Recommendation:           - Patient has a contact number available for                            emergencies. The signs and symptoms of potential                            delayed complications were discussed with the                            patient. Return to normal  activities tomorrow.                            Written discharge instructions were provided to the                            patient.                           - Resume previous diet.                           - Continue present medications.                           - No repeat colonoscopy due to age and the absence                            of colonic polyps. Gatha Mayer, MD 06/04/2021 11:13:21 AM This report has been signed electronically.

## 2021-06-04 NOTE — Progress Notes (Signed)
[facility] History and Physical   Primary Care Physician:  [name], MD   Reason for Procedure:   Hx adenomatous colon polyp  Plan:    colonoscopy     HPI: Russell [name] is a 70 y.o. male here for surveillance colonoscopy after adenoma removal in 2017   Past Medical History:  Diagnosis Date   Hx of adenomatous polyp of colon 03/24/2016   Hyperlipidemia    borderline- no meds- diet controlled   Hypertension     Past Surgical History:  Procedure Laterality Date   COLONOSCOPY  2005   Jameil Whitmoyer - hx polyp   FRACTURE SURGERY Left age 14   Left Knee   HERNIA REPAIR     UPPER GASTROINTESTINAL ENDOSCOPY     UPPER GI ENDOSCOPY     Normal   WISDOM TOOTH EXTRACTION      Prior to Admission medications   Medication Sig Start Date End Date Taking? Authorizing Provider  amLODipine (NORVASC) 5 MG tablet Take 1 tablet (5 mg total) by mouth daily. For blood pressure 03/05/21  Yes Stacks, Cletus Gash, MD  Ascorbic Acid (VITAMIN C PO) Take by mouth.   Yes [provider]  aspirin EC 81 MG tablet Take 81 mg by mouth every other day.    Yes [provider]  B Complex Vitamins (B-COMPLEX/B-12 PO) Take 1,000 mcg by mouth daily.   Yes [provider]  Cholecalciferol (VITAMIN D) 2000 units CAPS Take 2,000 Units by mouth daily.   Yes [provider]  olmesartan (BENICAR) 40 MG tablet Take 1 tablet (40 mg total) by mouth daily. For blood pressure 03/05/21  Yes Stacks, Cletus Gash, MD  Omega-3 Fatty Acids (FISH OIL) 1000 MG CAPS Take 1,000 mg by mouth.   Yes [provider]  augmented betamethasone dipropionate (DIPROLENE-AF) 0.05 % cream Apply topically 2 (two) times daily. At affected areas (avoid face and genitals) 09/03/20   [name], MD    Current Outpatient Medications  Medication Sig Dispense Refill   amLODipine (NORVASC) 5 MG tablet Take 1 tablet (5 mg total) by mouth daily. For blood pressure 90 tablet 3   Ascorbic Acid (VITAMIN  C PO) Take by mouth.     aspirin EC 81 MG tablet Take 81 mg by mouth every other day.      B Complex Vitamins (B-COMPLEX/B-12 PO) Take 1,000 mcg by mouth daily.     Cholecalciferol (VITAMIN D) 2000 units CAPS Take 2,000 Units by mouth daily.     olmesartan (BENICAR) 40 MG tablet Take 1 tablet (40 mg total) by mouth daily. For blood pressure 90 tablet 3   Omega-3 Fatty Acids (FISH OIL) 1000 MG CAPS Take 1,000 mg by mouth.     augmented betamethasone dipropionate (DIPROLENE-AF) 0.05 % cream Apply topically 2 (two) times daily. At affected areas (avoid face and genitals) 150 g 3   Current Facility-Administered Medications  Medication Dose Route Frequency Provider Last Rate Last Admin   0.9 %  sodium chloride infusion  500 mL Intravenous Continuous Gatha Mayer, MD        Allergies as of 06/04/2021   (No Known Allergies)    Family History  Problem Relation Age of Onset   Heart disease Mother    Diabetes Mother    Hyperlipidemia Father    Colon cancer Neg Hx    Esophageal cancer Neg Hx    Rectal cancer Neg Hx    Stomach cancer Neg Hx    Pancreatic cancer Neg  Hx    Prostate cancer Neg Hx    Colon polyps Neg Hx     Social History   Socioeconomic History   Marital status: Married    Spouse name: Not on file   Number of children: Not on file   Years of education: Not on file   Highest education level: Not on file  Occupational History   Not on file  Tobacco Use   Smoking status: Every Day    Packs/day: 1.00    Years: 45.00    Pack years: 45.00    Types: Cigarettes   Smokeless tobacco: Never  Vaping Use   Vaping Use: Never used  Substance and Sexual Activity   Alcohol use: Yes    Alcohol/week: 7.0 standard drinks    Types: 7 Shots of liquor per week   Drug use: Never   Sexual activity: Yes   Review of Systems:  All other review of systems negative except as mentioned in the HPI.  Physical Exam: Vital signs BP 138/76   Pulse (!) 50   Temp 98.4 F (36.9 C)  (Temporal)   Ht 5\' 10"  (1.778 m)   Wt 150 lb (68 kg)   SpO2 97%   BMI 21.52 kg/m   General:   Alert,  Well-developed, well-nourished, pleasant and cooperative in NAD Lungs:  Clear throughout to auscultation.   Heart:  Regular rate and rhythm; no murmurs, clicks, rubs,  or gallops. Abdomen:  Soft, nontender and nondistended. Normal bowel sounds.   Neuro/Psych:  Alert and cooperative. Normal mood and affect. A and O x 3   @Don Tiu  Simonne Maffucci, MD, [facility] [phone] (pager) 06/04/2021 10:37 AM@

## 2021-06-08 ENCOUNTER — Telehealth: Payer: Self-pay | Admitting: *Deleted

## 2021-06-08 ENCOUNTER — Telehealth: Payer: Self-pay

## 2021-06-08 NOTE — Telephone Encounter (Signed)
First attempt follow up call to pt, lm for pt to call if having any problems or questions, otherwise we will call them back later this morning or early this afternoon.  °

## 2021-06-08 NOTE — Telephone Encounter (Signed)
  Follow up Call-  Call back number 06/04/2021  Post procedure Call Back phone  # [phone]  Permission to leave phone message Yes  Some recent data might be hidden     Patient questions:  Do you have a fever, pain , or abdominal swelling? No. Pain Score  0 *  Have you tolerated food without any problems? Yes.    Have you been able to return to your normal activities? Yes.    Do you have any questions about your discharge instructions: Diet   No. Medications  No. Follow up visit  No.  Do you have questions or concerns about your Care? No.  Actions: * If pain score is 4 or above: No action needed, pain <4.Have you developed a fever since your procedure? no  2.   Have you had an respiratory symptoms (SOB or cough) since your procedure? no  3.   Have you tested positive for COVID 19 since your procedure no  4.   Have you had any family members/close contacts diagnosed with the COVID 19 since your procedure?  no   If yes to any of these questions please route to Joylene John, RN and Joella Prince, RN

## 2021-06-30 ENCOUNTER — Ambulatory Visit (HOSPITAL_BASED_OUTPATIENT_CLINIC_OR_DEPARTMENT_OTHER)
Admission: RE | Admit: 2021-06-30 | Discharge: 2021-06-30 | Disposition: A | Discharge status: Home / Self Care | Payer: Medicare HMO | Source: Ambulatory Visit | Attending: Physician Assistant | Admitting: Physician Assistant

## 2021-06-30 ENCOUNTER — Other Ambulatory Visit: Payer: Self-pay

## 2021-06-30 DIAGNOSIS — Z122 Encounter for screening for malignant neoplasm of respiratory organs: Secondary | ICD-10-CM | POA: Insufficient documentation

## 2021-06-30 DIAGNOSIS — F17210 Nicotine dependence, cigarettes, uncomplicated: Secondary | ICD-10-CM | POA: Diagnosis not present

## 2021-06-30 DIAGNOSIS — Z87891 Personal history of nicotine dependence: Secondary | ICD-10-CM | POA: Diagnosis not present

## 2021-07-14 ENCOUNTER — Other Ambulatory Visit: Payer: Self-pay

## 2021-07-14 ENCOUNTER — Telehealth: Payer: Self-pay | Admitting: Family Medicine

## 2021-07-14 DIAGNOSIS — Z122 Encounter for screening for malignant neoplasm of respiratory organs: Secondary | ICD-10-CM

## 2021-07-14 DIAGNOSIS — F17200 Nicotine dependence, unspecified, uncomplicated: Secondary | ICD-10-CM

## 2021-07-15 NOTE — Telephone Encounter (Signed)
CT chest showed no sign of lung cancer. There is some COPD. IT is treated based on if you become short of breath easily.

## 2021-07-15 NOTE — Telephone Encounter (Signed)
Patient is aware 

## 2021-07-16 ENCOUNTER — Telehealth: Payer: Self-pay | Admitting: Family Medicine

## 2021-07-16 NOTE — Telephone Encounter (Signed)
Spoke with patient to schedule Medicare Annual Wellness Visit (AWV) either virtually or phone   Patient declined not interested  *due 09/06/2018 awvi per [facility]  please schedule at anytime with health coach  This should be a 45 minute visit.

## 2021-07-24 DIAGNOSIS — J101 Influenza due to other identified influenza virus with other respiratory manifestations: Secondary | ICD-10-CM | POA: Diagnosis not present

## 2021-07-24 DIAGNOSIS — R059 Cough, unspecified: Secondary | ICD-10-CM | POA: Diagnosis not present

## 2021-09-02 ENCOUNTER — Ambulatory Visit (INDEPENDENT_AMBULATORY_CARE_PROVIDER_SITE_OTHER): Payer: Medicare HMO | Admitting: Family Medicine

## 2021-09-02 ENCOUNTER — Encounter: Payer: Self-pay | Admitting: Family Medicine

## 2021-09-02 VITALS — BP 136/72 | HR 58 | Temp 98.1°F | Ht 70.0 in | Wt 150.8 lb

## 2021-09-02 DIAGNOSIS — K5730 Diverticulosis of large intestine without perforation or abscess without bleeding: Secondary | ICD-10-CM | POA: Diagnosis not present

## 2021-09-02 DIAGNOSIS — E559 Vitamin D deficiency, unspecified: Secondary | ICD-10-CM

## 2021-09-02 DIAGNOSIS — I10 Essential (primary) hypertension: Secondary | ICD-10-CM

## 2021-09-02 DIAGNOSIS — H40033 Anatomical narrow angle, bilateral: Secondary | ICD-10-CM | POA: Diagnosis not present

## 2021-09-02 DIAGNOSIS — H2513 Age-related nuclear cataract, bilateral: Secondary | ICD-10-CM | POA: Diagnosis not present

## 2021-09-02 NOTE — Progress Notes (Signed)
Subjective:  Patient ID: Russell [name], male    DOB: [birth year]  Age: 70 y.o. MRN: [id number]  CC: Medical Management of Chronic Issues   HPI Russell [name] presents for  follow-up of hypertension. Patient has no history of headache chest pain or shortness of breath or recent cough. Patient also denies symptoms of TIA such as focal numbness or weakness. Patient denies side effects from medication. States taking it regularly.  Several Days of diarrhea over Christmas. Gone by day before yesterday. No abd pain. Did lose his appetite. Flu 3-4 weeks ago.    History Russell [name] has a past medical history of adenomatous polyp of colon (03/24/2016), Hyperlipidemia, and Hypertension.   He has a past surgical history that includes Hernia repair; Fracture surgery (Left, age 61); Wisdom tooth extraction; Colonoscopy (2005); Upper gi endoscopy; and Upper gastrointestinal endoscopy.   His family history includes Diabetes in his mother; Heart disease in his mother; Hyperlipidemia in his father.He reports that he has been smoking cigarettes. He has a 45.00 pack-year smoking history. He has never used smokeless tobacco. He reports current alcohol use of about 7.0 standard drinks per week. He reports that he does not use drugs.  Current Outpatient Medications on File Prior to Visit  Medication Sig Dispense Refill   amLODipine (NORVASC) 5 MG tablet Take 1 tablet (5 mg total) by mouth daily. For blood pressure 90 tablet 3   Ascorbic Acid (VITAMIN C PO) Take by mouth.     aspirin EC 81 MG tablet Take 81 mg by mouth every other day.      augmented betamethasone dipropionate (DIPROLENE-AF) 0.05 % cream Apply topically 2 (two) times daily. At affected areas (avoid face and genitals) 150 g 3   B Complex Vitamins (B-COMPLEX/B-12 PO) Take 1,000 mcg by mouth daily.     Cholecalciferol (VITAMIN D) 2000 units CAPS Take 2,000 Units by mouth daily.     olmesartan (BENICAR) 40 MG tablet Take 1 tablet (40 mg total) by mouth  daily. For blood pressure 90 tablet 3   Omega-3 Fatty Acids (FISH OIL) 1000 MG CAPS Take 1,000 mg by mouth.     No current facility-administered medications on file prior to visit.    ROS Review of Systems  Constitutional:  Negative for fever.  Respiratory:  Negative for shortness of breath.   Cardiovascular:  Negative for chest pain.  Musculoskeletal:  Negative for arthralgias.  Skin:  Negative for rash.   Objective:  BP 136/72    Pulse (!) 58    Temp 98.1 F (36.7 C)    Ht 5' 10" (1.778 m)    Wt 150 lb 12.8 oz (68.4 kg)    SpO2 98%    BMI 21.64 kg/m   BP Readings from Last 3 Encounters:  09/02/21 136/72  06/04/21 110/65  03/05/21 (!) 146/64    Wt Readings from Last 3 Encounters:  09/02/21 150 lb 12.8 oz (68.4 kg)  06/30/21 150 lb (68 kg)  06/04/21 150 lb (68 kg)     Physical Exam Vitals reviewed. Exam conducted with a chaperone present.  Constitutional:      Appearance: He is well-developed.  HENT:     Head: Normocephalic and atraumatic.     Right Ear: External ear normal.     Left Ear: External ear normal.     Mouth/Throat:     Pharynx: No oropharyngeal exudate or posterior oropharyngeal erythema.  Eyes:     Pupils: Pupils are equal, round, and reactive  to light.  Cardiovascular:     Rate and Rhythm: Normal rate and regular rhythm.     Heart sounds: No murmur heard. Pulmonary:     Effort: No respiratory distress.     Breath sounds: Normal breath sounds.  Musculoskeletal:     Cervical back: Normal range of motion and neck supple.  Neurological:     Mental Status: He is alert and oriented to person, place, and time.      Assessment & Plan:   Cart[place] was seen today for medical management of chronic issues.  Diagnoses and all orders for this visit:  Primary hypertension -     CMP14+EGFR  Vitamin D deficiency -     VITAMIN D 25 Hydroxy (Vit-D Deficiency, Fractures)  Diverticulosis of colon   Allergies as of 09/02/2021   No Known Allergies       Medication List        Accurate as of September 02, 2021  8:34 AM. If you have any questions, ask your nurse or doctor.          amLODipine 5 MG tablet Commonly known as: NORVASC Take 1 tablet (5 mg total) by mouth daily. For blood pressure   aspirin EC 81 MG tablet Take 81 mg by mouth every other day.   augmented betamethasone dipropionate 0.05 % cream Commonly known as: DIPROLENE-AF Apply topically 2 (two) times daily. At affected areas (avoid face and genitals)   B-COMPLEX/B-12 PO Take 1,000 mcg by mouth daily.   Fish Oil 1000 MG Caps Take 1,000 mg by mouth.   olmesartan 40 MG tablet Commonly known as: BENICAR Take 1 tablet (40 mg total) by mouth daily. For blood pressure   VITAMIN C PO Take by mouth.   Vitamin D 50 MCG (2000 UT) Caps Take 2,000 Units by mouth daily.        No orders of the defined types were placed in this encounter.   Colonoscopy reviewed. No further testing recommended. High fiber diet for diverticulosis reviewed.  Follow-up: Return in about 6 months (around 03/03/2022) for [facility] physical.  [name], M.D.

## 2021-09-03 LAB — CMP14+EGFR
ALT: 13 IU/L (ref 0–44)
AST: 19 IU/L (ref 0–40)
Albumin/Globulin Ratio: 2 (ref 1.2–2.2)
Albumin: 4.2 g/dL (ref 3.8–4.8)
Alkaline Phosphatase: 66 IU/L (ref 44–121)
BUN/Creatinine Ratio: 15 (ref 10–24)
BUN: 15 mg/dL (ref 8–27)
Bilirubin Total: 0.2 mg/dL (ref 0.0–1.2)
CO2: 25 mmol/L (ref 20–29)
Calcium: 8.8 mg/dL (ref 8.6–10.2)
Chloride: 101 mmol/L (ref 96–106)
Creatinine, Ser: 0.97 mg/dL (ref 0.76–1.27)
Globulin, Total: 2.1 g/dL (ref 1.5–4.5)
Glucose: 91 mg/dL (ref 70–99)
Potassium: 5.1 mmol/L (ref 3.5–5.2)
Sodium: 136 mmol/L (ref 134–144)
Total Protein: 6.3 g/dL (ref 6.0–8.5)
eGFR: 84 mL/min/{1.73_m2} (ref >59)

## 2021-09-03 LAB — VITAMIN D 25 HYDROXY (VIT D DEFICIENCY, FRACTURES): Vit D, 25-Hydroxy: 119 ng/mL — ABNORMAL HIGH (ref 30.0–100.0)

## 2021-10-23 DIAGNOSIS — H6121 Impacted cerumen, right ear: Secondary | ICD-10-CM | POA: Diagnosis not present

## 2022-03-03 ENCOUNTER — Encounter: Payer: Self-pay | Admitting: Family Medicine

## 2022-03-03 ENCOUNTER — Ambulatory Visit (INDEPENDENT_AMBULATORY_CARE_PROVIDER_SITE_OTHER): Payer: Medicare HMO | Admitting: Family Medicine

## 2022-03-03 VITALS — BP 130/69 | HR 54 | Temp 98.1°F | Ht 70.0 in | Wt 149.0 lb

## 2022-03-03 DIAGNOSIS — E559 Vitamin D deficiency, unspecified: Secondary | ICD-10-CM | POA: Diagnosis not present

## 2022-03-03 DIAGNOSIS — Z0000 Encounter for general adult medical examination without abnormal findings: Secondary | ICD-10-CM | POA: Diagnosis not present

## 2022-03-03 DIAGNOSIS — T148XXA Other injury of unspecified body region, initial encounter: Secondary | ICD-10-CM | POA: Diagnosis not present

## 2022-03-03 DIAGNOSIS — I10 Essential (primary) hypertension: Secondary | ICD-10-CM

## 2022-03-03 DIAGNOSIS — Z125 Encounter for screening for malignant neoplasm of prostate: Secondary | ICD-10-CM

## 2022-03-03 DIAGNOSIS — R233 Spontaneous ecchymoses: Secondary | ICD-10-CM

## 2022-03-03 DIAGNOSIS — Z0001 Encounter for general adult medical examination with abnormal findings: Secondary | ICD-10-CM

## 2022-03-03 DIAGNOSIS — Z13220 Encounter for screening for lipoid disorders: Secondary | ICD-10-CM | POA: Diagnosis not present

## 2022-03-03 MED ORDER — OLMESARTAN MEDOXOMIL 40 MG PO TABS: 40.0000 mg | ORAL_TABLET | Freq: Every day | ORAL | 3 refills | Status: DC

## 2022-03-03 NOTE — Progress Notes (Signed)
Subjective:  Patient ID: Russell [name], male    DOB: Jan 01, 1951  Age: 71 y.o. MRN: [id number]  CC: Annual Exam   HPI Russell [name] presents for Annual Exam     03/03/2022    9:38 AM 03/03/2022    9:29 AM 09/02/2021    8:11 AM  Depression screen PHQ 2/9  Decreased Interest 1 0 0  Down, Depressed, Hopeless 1 0 0  PHQ - 2 Score 2 0 0  Altered sleeping 0  0  Tired, decreased energy 1  1  Change in appetite 1  1  Feeling bad or failure about yourself  1  0  Trouble concentrating 1  0  Moving slowly or fidgety/restless 0  0  Suicidal thoughts 0  0  PHQ-9 Score 6  2  Difficult doing work/chores Not difficult at all  Not difficult at all    History Russell [name] has a past medical history of adenomatous polyp of colon (03/24/2016), Hyperlipidemia, and Hypertension.   He has a past surgical history that includes Hernia repair; Fracture surgery (Left, age 42); Wisdom tooth extraction; Colonoscopy (2005); Upper gi endoscopy; and Upper gastrointestinal endoscopy.   His family history includes Diabetes in his mother; Heart disease in his mother; Hyperlipidemia in his father.He reports that he has been smoking cigarettes. He has a 45.00 pack-year smoking history. He has never used smokeless tobacco. He reports current alcohol use of about 7.0 standard drinks of alcohol per week. He reports that he does not use drugs.    ROS Review of Systems  Constitutional:  Positive for fatigue (just can't do as much as he could 5 years ago.). Negative for activity change and unexpected weight change.  HENT:  Negative for congestion, ear pain, hearing loss, postnasal drip and trouble swallowing.   Eyes:  Positive for visual disturbance (seeing Particia [name]. Thinks he may need glasses). Negative for pain.  Respiratory:  Negative for cough, chest tightness and shortness of breath.   Cardiovascular:  Negative for chest pain, palpitations and leg swelling.  Gastrointestinal:  Negative for abdominal distention, abdominal  pain, blood in stool, constipation, diarrhea, nausea and vomiting.  Endocrine: Negative for cold intolerance, heat intolerance and polydipsia.  Genitourinary:  Negative for difficulty urinating, dysuria, flank pain, frequency and urgency.  Musculoskeletal:  Negative for arthralgias and joint swelling.  Skin:  Negative for color change, rash and wound.  Neurological:  Negative for dizziness, syncope, speech difficulty, weakness, light-headedness, numbness and headaches.  Hematological:  Bruises/bleeds easily.  Psychiatric/Behavioral:  Negative for confusion, decreased concentration, dysphoric mood and sleep disturbance. The patient is not nervous/anxious.     Objective:  BP 130/69   Pulse (!) 54   Temp 98.1 F (36.7 C)   Ht '5\' 10"'  (1.778 m)   Wt 149 lb (67.6 kg)   SpO2 96%   BMI 21.38 kg/m   BP Readings from Last 3 Encounters:  03/03/22 130/69  09/02/21 136/72  06/04/21 110/65    Wt Readings from Last 3 Encounters:  03/03/22 149 lb (67.6 kg)  09/02/21 150 lb 12.8 oz (68.4 kg)  06/30/21 150 lb (68 kg)     Physical Exam Constitutional:      Appearance: He is well-developed.  HENT:     Head: Normocephalic and atraumatic.  Eyes:     Pupils: Pupils are equal, round, and reactive to light.  Neck:     Thyroid: No thyromegaly.     Trachea: No tracheal deviation.  Cardiovascular:  Rate and Rhythm: Normal rate and regular rhythm.     Heart sounds: Normal heart sounds. No murmur heard.    No friction rub. No gallop.  Pulmonary:     Breath sounds: Normal breath sounds. No wheezing or rales.  Abdominal:     General: Bowel sounds are normal. There is no distension.     Palpations: Abdomen is soft. There is no mass.     Tenderness: There is no abdominal tenderness.     Hernia: There is no hernia in the left inguinal area.  Genitourinary:    Penis: Normal.      Testes: Normal.  Musculoskeletal:        General: Normal range of motion.     Cervical back: Normal range of  motion.  Lymphadenopathy:     Cervical: No cervical adenopathy.  Skin:    General: Skin is warm and dry.  Neurological:     Mental Status: He is alert and oriented to person, place, and time.       Assessment & Plan:   [place] was seen today for annual exam.  Diagnoses and all orders for this visit:  Physical exam, annual -     CBC with Differential/Platelet -     CMP14+EGFR -     Lipid panel -     VITAMIN D 25 Hydroxy (Vit-D Deficiency, Fractures)  Primary hypertension -     CBC with Differential/Platelet -     CMP14+EGFR -     olmesartan (BENICAR) 40 MG tablet; Take 1 tablet (40 mg total) by mouth daily. For blood pressure  Vitamin D deficiency -     VITAMIN D 25 Hydroxy (Vit-D Deficiency, Fractures)  Lipid screening -     Lipid panel  Bruising -     Protime-INR  Screening for prostate cancer -     PSA, total and free       I am having Russell [name] maintain his aspirin EC, Fish Oil, Vitamin D, Ascorbic Acid (VITAMIN C PO), augmented betamethasone dipropionate, amLODipine, B Complex Vitamins (B-COMPLEX/B-12 PO), and olmesartan.  Allergies as of 03/03/2022   No Known Allergies      Medication List        Accurate as of March 03, 2022 10:11 AM. If you have any questions, ask your nurse or doctor.          amLODipine 5 MG tablet Commonly known as: NORVASC Take 1 tablet (5 mg total) by mouth daily. For blood pressure   aspirin EC 81 MG tablet Take 81 mg by mouth every other day.   augmented betamethasone dipropionate 0.05 % cream Commonly known as: DIPROLENE-AF Apply topically 2 (two) times daily. At affected areas (avoid face and genitals)   B-COMPLEX/B-12 PO Take 1,000 mcg by mouth daily.   Fish Oil 1000 MG Caps Take 1,000 mg by mouth.   olmesartan 40 MG tablet Commonly known as: BENICAR Take 1 tablet (40 mg total) by mouth daily. For blood pressure   VITAMIN C PO Take by mouth.   Vitamin D 50 MCG (2000 UT) Caps Take 2,000 Units by  mouth daily.         Follow-up: Return in about 6 months (around 09/02/2022).  Russell [name], M.D.

## 2022-03-04 LAB — CBC WITH DIFFERENTIAL/PLATELET
Basophils Absolute: 0.1 10*3/uL (ref 0.0–0.2)
Basos: 1 %
EOS (ABSOLUTE): 0.2 10*3/uL (ref 0.0–0.4)
Eos: 2 %
Hematocrit: 48.1 % (ref 37.5–51.0)
Hemoglobin: 16.4 g/dL (ref 13.0–17.7)
Immature Grans (Abs): 0 10*3/uL (ref 0.0–0.1)
Immature Granulocytes: 1 %
Lymphocytes Absolute: 2.1 10*3/uL (ref 0.7–3.1)
Lymphs: 24 %
MCH: 32.7 pg (ref 26.6–33.0)
MCHC: 34.1 g/dL (ref 31.5–35.7)
MCV: 96 fL (ref 79–97)
Monocytes Absolute: 0.7 10*3/uL (ref 0.1–0.9)
Monocytes: 8 %
Neutrophils Absolute: 5.7 10*3/uL (ref 1.4–7.0)
Neutrophils: 64 %
Platelets: 234 10*3/uL (ref 150–450)
RBC: 5.01 x10E6/uL (ref 4.14–5.80)
RDW: 12.7 % (ref 11.6–15.4)
WBC: 8.9 10*3/uL (ref 3.4–10.8)

## 2022-03-04 LAB — LIPID PANEL
Chol/HDL Ratio: 1.9 ratio (ref 0.0–5.0)
Cholesterol, Total: 191 mg/dL (ref 100–199)
HDL: 98 mg/dL (ref >39)
LDL Chol Calc (NIH): 84 mg/dL (ref 0–99)
Triglycerides: 48 mg/dL (ref 0–149)
VLDL Cholesterol Cal: 9 mg/dL (ref 5–40)

## 2022-03-04 LAB — CMP14+EGFR
ALT: 14 IU/L (ref 0–44)
AST: 17 IU/L (ref 0–40)
Albumin/Globulin Ratio: 2.1 (ref 1.2–2.2)
Albumin: 4.5 g/dL (ref 3.7–4.7)
Alkaline Phosphatase: 61 IU/L (ref 44–121)
BUN/Creatinine Ratio: 12 (ref 10–24)
BUN: 12 mg/dL (ref 8–27)
Bilirubin Total: 0.5 mg/dL (ref 0.0–1.2)
CO2: 22 mmol/L (ref 20–29)
Calcium: 9.4 mg/dL (ref 8.6–10.2)
Chloride: 102 mmol/L (ref 96–106)
Creatinine, Ser: 1 mg/dL (ref 0.76–1.27)
Globulin, Total: 2.1 g/dL (ref 1.5–4.5)
Glucose: 81 mg/dL (ref 70–99)
Potassium: 4.8 mmol/L (ref 3.5–5.2)
Sodium: 140 mmol/L (ref 134–144)
Total Protein: 6.6 g/dL (ref 6.0–8.5)
eGFR: 80 mL/min/{1.73_m2} (ref >59)

## 2022-03-04 LAB — PROTIME-INR
INR: 0.9 (ref 0.9–1.2)
Prothrombin Time: 9.7 s (ref 9.1–12.0)

## 2022-03-04 LAB — PSA, TOTAL AND FREE
PSA, Free Pct: 30 %
PSA, Free: 0.18 ng/mL
Prostate Specific Ag, Serum: 0.6 ng/mL (ref 0.0–4.0)

## 2022-03-04 LAB — VITAMIN D 25 HYDROXY (VIT D DEFICIENCY, FRACTURES): Vit D, 25-Hydroxy: 56.5 ng/mL (ref 30.0–100.0)

## 2022-03-05 NOTE — Progress Notes (Signed)
Hello Slevin,  Your lab result is normal and/or stable.Some minor variations that are not significant are commonly marked abnormal, but do not represent any medical problem for you.  Best regards, Harve Spradley, M.D.

## 2022-04-14 ENCOUNTER — Other Ambulatory Visit: Payer: Self-pay | Admitting: Family Medicine

## 2022-05-04 IMAGING — CT CT CHEST LUNG CANCER SCREENING LOW DOSE W/O CM
2 of 3 series · 15 of 36 positions shown, 18 images · non-contrast
Comparison: Diagnostic CT 04/30/2010.

CLINICAL DATA: Forty-nine pack-year smoking history. Current
smoker.

EXAM:
CT CHEST WITHOUT CONTRAST LOW-DOSE FOR LUNG CANCER SCREENING
TECHNIQUE: Multidetector CT imaging of the chest was performed following the
standard protocol without IV contrast.

[Series 2: ldct soft tissue · axial · 0.74mm/px · z∈[+1280,+1564]mm · 12 of 67 slices shown, 15 images]
[im 5/67  mediastinal]
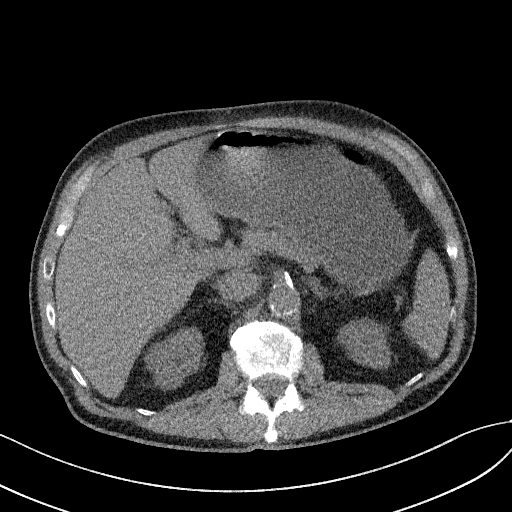
[im 5/67  lung]
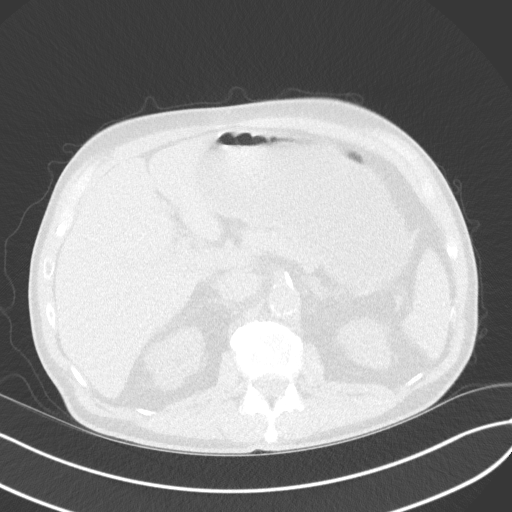
[im 10/67  lung]
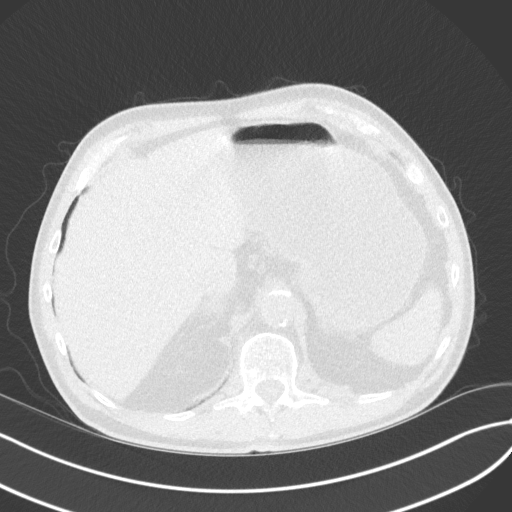
[im 15/67  lung]
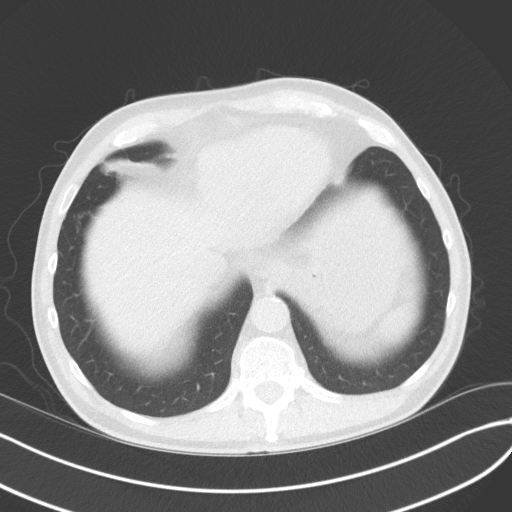
[im 20/67  lung]
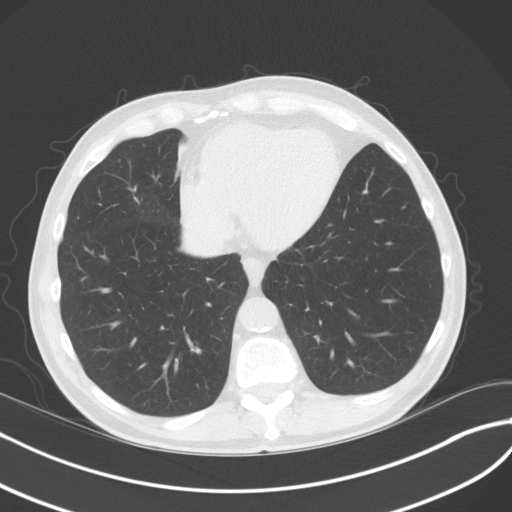
[im 25/67  mediastinal]
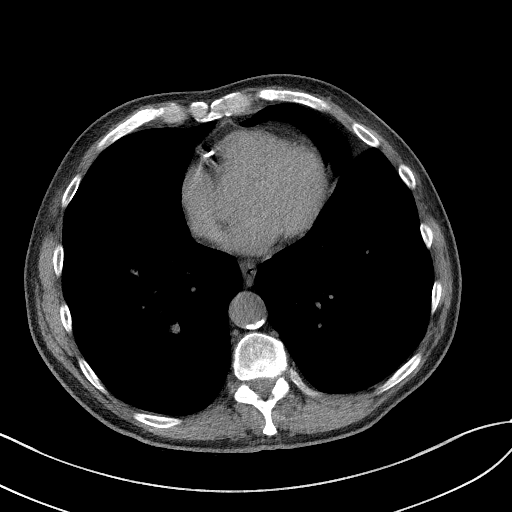
[im 25/67  lung]
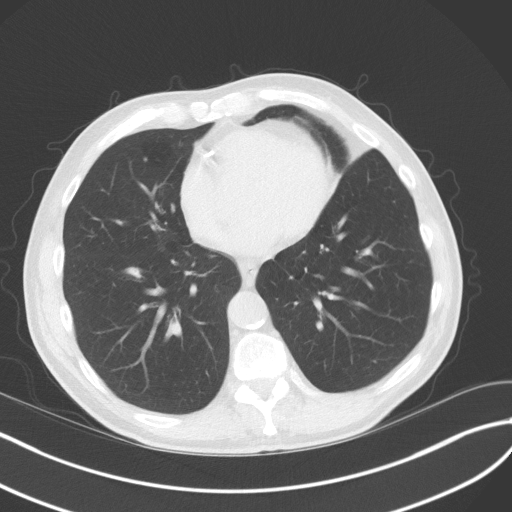
[im 30/67  lung]
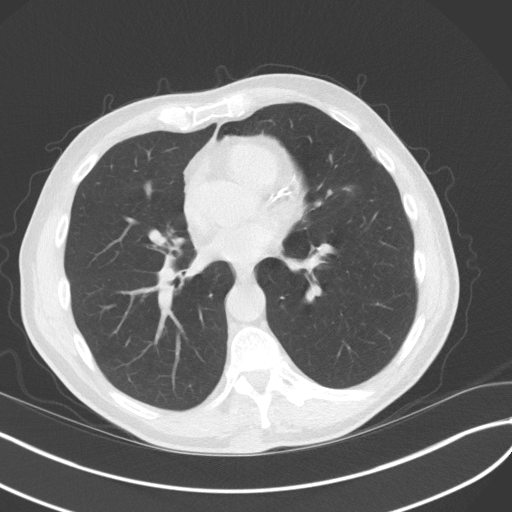
[im 37/67  lung]
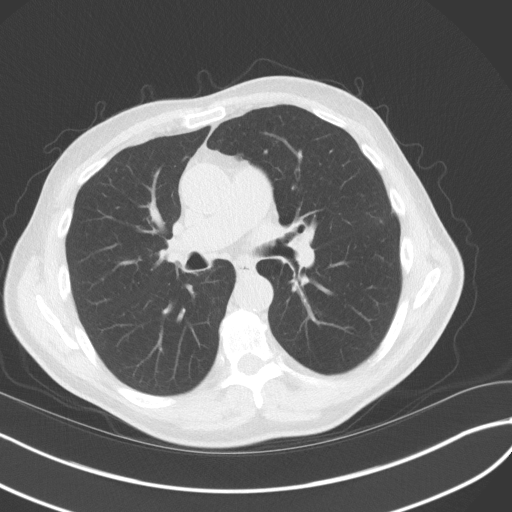
[im 42/67  lung]
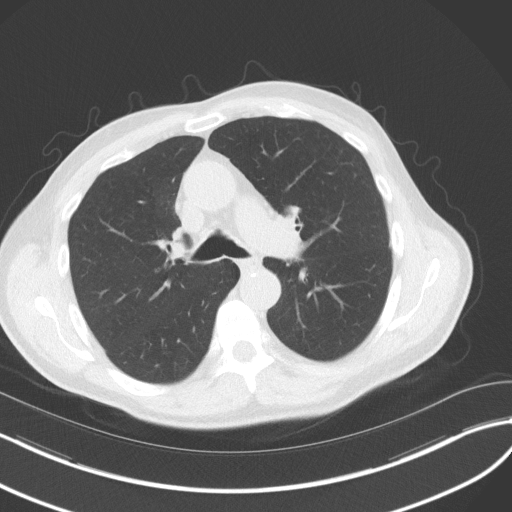
[im 47/67  mediastinal]
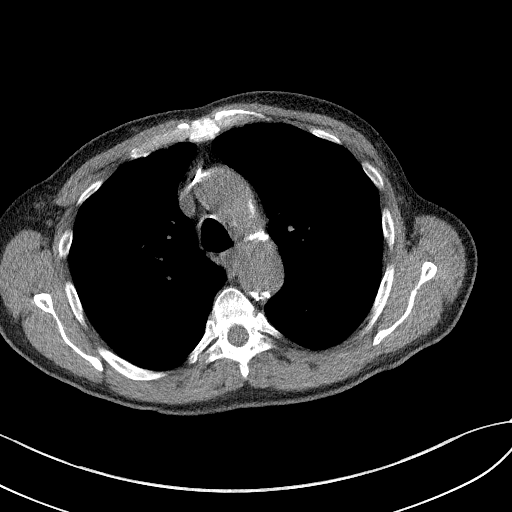
[im 47/67  lung]
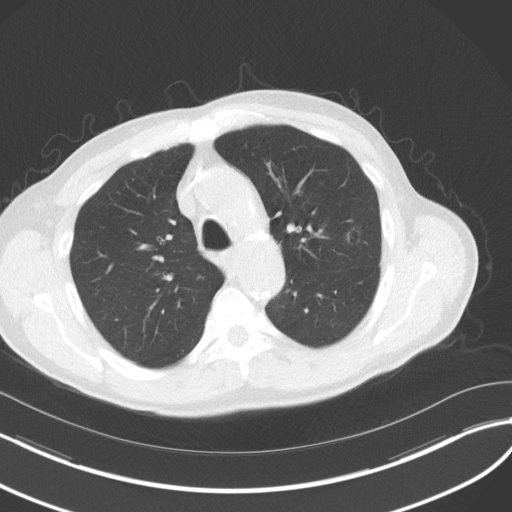
[im 52/67  lung]
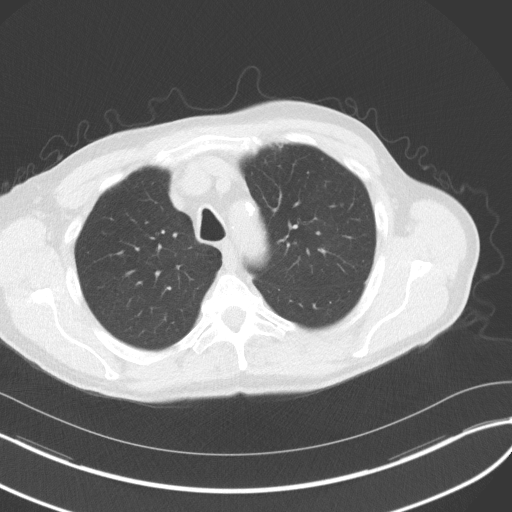
[im 57/67  lung]
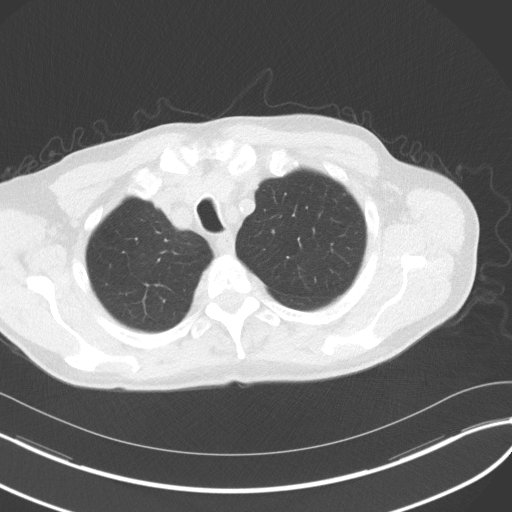
[im 62/67  lung]
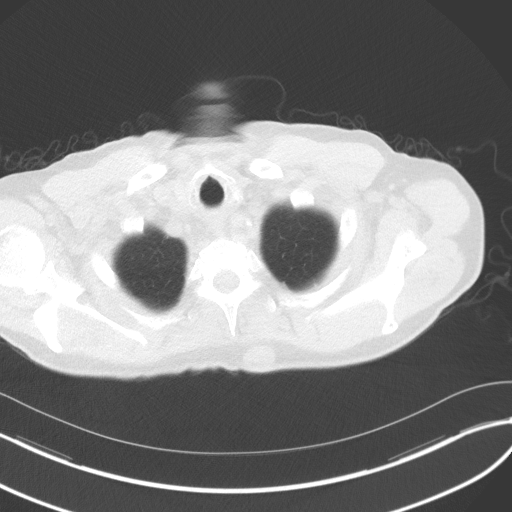

[Series 4: coronal soft · coronal · 0.69mm/px · 3 of 289 slices shown]
[im 58/289  lung]
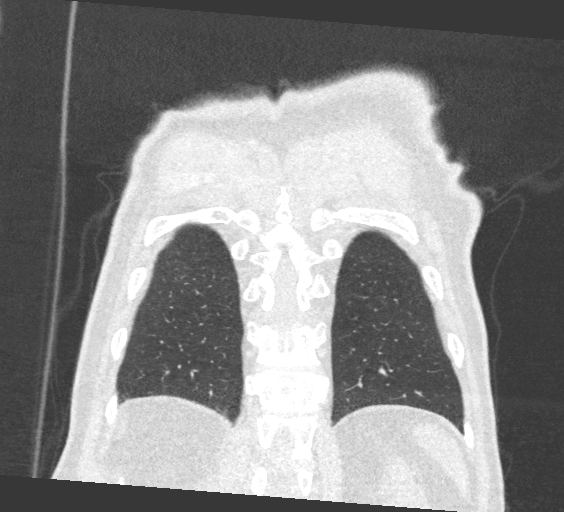
[im 116/289  lung]
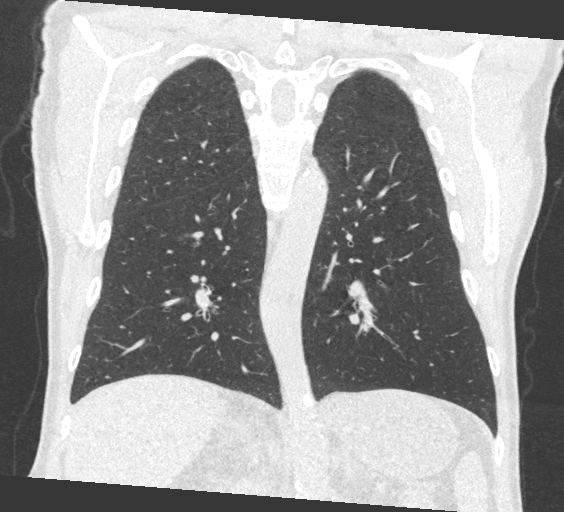
[im 173/289  lung]
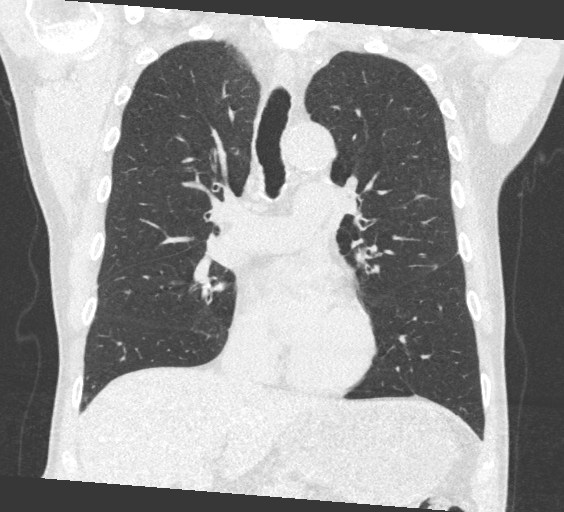

[15 of 36 positions shown; findings below may reference images not displayed]

FINDINGS: Cardiovascular: Aortic atherosclerosis. Tortuous thoracic aorta.
Normal heart size, without pericardial effusion. Lad and right
coronary artery calcification.

Mediastinum/Nodes: No mediastinal or definite hilar adenopathy,
given limitations of unenhanced CT.

Lungs/Pleura: No pleural fluid. Mild centrilobular emphysema. Lower
lobe predominant bronchial wall thickening. Left apical pulmonary
nodule measures volume derived equivalent diameter 3.3 mm.

Thin walled cavitary left upper lobe bulla at 11 mm on 97/3. Peri
fissural left lower lobe pulmonary nodule measures volume derived
equivalent diameter 4.3 mm.

Upper Abdomen: Normal imaged portions of the liver, spleen, stomach,
pancreas, adrenal glands, kidneys.

Musculoskeletal: Left posterior chest wall 2.3 cm subcutaneous
lesion on [DATE] is new since the remote CT. There is an enlarging
right paramidline posterior chest wall 2.5 cm lesion on 39/2.

No acute osseous abnormality.
IMPRESSION: 1. Lung-RADS 2, benign appearance or behavior. Continue annual
screening with low-dose chest CT without contrast in 12 months.
2. Aortic Atherosclerosis (8DAVF-ER5.5) and Emphysema (8DAVF-JYF.0).
Coronary artery atherosclerosis.
3. Probable sebaceous cysts about the posterior chest wall. Consider
physical exam correlation.

## 2022-06-16 DIAGNOSIS — J329 Chronic sinusitis, unspecified: Secondary | ICD-10-CM | POA: Diagnosis not present

## 2022-06-16 DIAGNOSIS — R059 Cough, unspecified: Secondary | ICD-10-CM | POA: Diagnosis not present

## 2022-06-16 DIAGNOSIS — Z20822 Contact with and (suspected) exposure to covid-19: Secondary | ICD-10-CM | POA: Diagnosis not present

## 2022-08-26 ENCOUNTER — Ambulatory Visit (INDEPENDENT_AMBULATORY_CARE_PROVIDER_SITE_OTHER): Payer: Medicare HMO | Admitting: Family Medicine

## 2022-08-26 ENCOUNTER — Ambulatory Visit: Payer: Medicare HMO | Admitting: Family Medicine

## 2022-08-26 ENCOUNTER — Encounter: Payer: Self-pay | Admitting: Family Medicine

## 2022-08-26 VITALS — BP 133/62 | HR 57 | Temp 98.7°F | Ht 70.0 in

## 2022-08-26 DIAGNOSIS — Z23 Encounter for immunization: Secondary | ICD-10-CM | POA: Diagnosis not present

## 2022-08-26 DIAGNOSIS — I10 Essential (primary) hypertension: Secondary | ICD-10-CM | POA: Diagnosis not present

## 2022-08-26 MED ORDER — AMLODIPINE BESYLATE 5 MG PO TABS: ORAL_TABLET | ORAL | 3 refills | Status: DC

## 2022-08-26 NOTE — Progress Notes (Signed)
Subjective:  Patient ID: Russell [name], male    DOB: 22-Apr-1951  Age: 71 y.o. MRN: [id number]  CC: Medical Management of Chronic Issues   HPI Russell [name] presents for  follow-up of hypertension. Patient has no history of headache chest pain or shortness of breath or recent cough. Patient also denies symptoms of TIA such as focal numbness or weakness. Patient denies side effects from medication. States taking it regularly.   History Russell [name] has a past medical history of adenomatous polyp of colon (03/24/2016), Hyperlipidemia, and Hypertension.   Russell [name] has a past surgical history that includes Hernia repair; Fracture surgery (Left, age 61); Wisdom tooth extraction; Colonoscopy (09/07/2003); Upper gi endoscopy; Upper gastrointestinal endoscopy; and cyst removal from gum.   His family history includes Diabetes in his mother; Heart disease in his mother; Hyperlipidemia in his father.Russell [name] reports that Russell [name] has been smoking cigarettes. Russell [name] has a 45.00 pack-year smoking history. Russell [name] has never used smokeless tobacco. Russell [name] reports current alcohol use of about 7.0 standard drinks of alcohol per week. Russell [name] reports that Russell [name] does not use drugs.  Current Outpatient Medications on File Prior to Visit  Medication Sig Dispense Refill   Ascorbic Acid (VITAMIN C PO) Take by mouth.     aspirin EC 81 MG tablet Take 81 mg by mouth every other day.      augmented betamethasone dipropionate (DIPROLENE-AF) 0.05 % cream Apply topically 2 (two) times daily. At affected areas (avoid face and genitals) 150 g 3   B Complex Vitamins (B-COMPLEX/B-12 PO) Take 1,000 mcg by mouth daily.     Cholecalciferol (VITAMIN D) 2000 units CAPS Take 2,000 Units by mouth daily.     olmesartan (BENICAR) 40 MG tablet Take 1 tablet (40 mg total) by mouth daily. For blood pressure 90 tablet 3   Omega-3 Fatty Acids (FISH OIL) 1000 MG CAPS Take 1,000 mg by mouth.     No current facility-administered medications on file prior to visit.    ROS Review of  Systems  Constitutional:  Negative for fever.  Respiratory:  Negative for shortness of breath.   Cardiovascular:  Negative for chest pain.  Musculoskeletal:  Negative for arthralgias.  Skin:  Negative for rash.    Objective:  BP 133/62   Pulse (!) 57   Temp 98.7 F (37.1 C)   Ht _0  (1.778 m)   SpO2 96%   BMI 21.38 kg/m   BP Readings from Last 3 Encounters:  08/26/22 133/62  03/03/22 130/69  09/02/21 136/72    Wt Readings from Last 3 Encounters:  03/03/22 149 lb (67.6 kg)  09/02/21 150 lb 12.8 oz (68.4 kg)  06/30/21 150 lb (68 kg)     Physical Exam Vitals reviewed.  Constitutional:      Appearance: Russell [name] is well-developed.  HENT:     Head: Normocephalic and atraumatic.     Right Ear: External ear normal.     Left Ear: External ear normal.     Mouth/Throat:     Pharynx: No oropharyngeal exudate or posterior oropharyngeal erythema.  Eyes:     Pupils: Pupils are equal, round, and reactive to light.  Cardiovascular:     Rate and Rhythm: Normal rate and regular rhythm.     Heart sounds: No murmur heard. Pulmonary:     Effort: No respiratory distress.     Breath sounds: Normal breath sounds.  Musculoskeletal:     Cervical back: Normal range of motion and neck supple.  Neurological:  Mental Status: Russell [name] is alert and oriented to person, place, and time.       Assessment & Plan:   Russell [name] was seen today for medical management of chronic issues.  Diagnoses and all orders for this visit:  Primary hypertension -     BMP8+EGFR  Need for influenza vaccination -     Flu Vaccine QUAD High Dose(Fluad)  Other orders -     amLODipine (NORVASC) 5 MG tablet; TAKE 1 TABLET EVERY DAY  FOR BLOOD PRESSURE   Allergies as of 08/26/2022   No Known Allergies      Medication List        Accurate as of August 26, 2022  4:30 PM. If you have any questions, ask your nurse or doctor.          amLODipine 5 MG tablet Commonly known as: NORVASC TAKE 1 TABLET  EVERY DAY  FOR BLOOD PRESSURE   aspirin EC 81 MG tablet Take 81 mg by mouth every other day.   augmented betamethasone dipropionate 0.05 % cream Commonly known as: DIPROLENE-AF Apply topically 2 (two) times daily. At affected areas (avoid face and genitals)   B-COMPLEX/B-12 PO Take 1,000 mcg by mouth daily.   Fish Oil 1000 MG Caps Take 1,000 mg by mouth.   olmesartan 40 MG tablet Commonly known as: BENICAR Take 1 tablet (40 mg total) by mouth daily. For blood pressure   VITAMIN C PO Take by mouth.   Vitamin D 50 MCG (2000 UT) Caps Take 2,000 Units by mouth daily.        Meds ordered this encounter  Medications   amLODipine (NORVASC) 5 MG tablet    Sig: TAKE 1 TABLET EVERY DAY  FOR BLOOD PRESSURE    Dispense:  90 tablet    Refill:  3      Follow-up: Return in about 6 months (around 02/25/2023) for [facility] physical.  [name], M.D.

## 2022-08-27 LAB — BMP8+EGFR
BUN/Creatinine Ratio: 16 (ref 10–24)
BUN: 15 mg/dL (ref 8–27)
CO2: 25 mmol/L (ref 20–29)
Calcium: 9.1 mg/dL (ref 8.6–10.2)
Chloride: 106 mmol/L (ref 96–106)
Creatinine, Ser: 0.91 mg/dL (ref 0.76–1.27)
Glucose: 75 mg/dL (ref 70–99)
Potassium: 4.6 mmol/L (ref 3.5–5.2)
Sodium: 144 mmol/L (ref 134–144)
eGFR: 90 mL/min/{1.73_m2} (ref >59)

## 2022-09-01 DIAGNOSIS — H5213 Myopia, bilateral: Secondary | ICD-10-CM | POA: Diagnosis not present

## 2022-09-06 NOTE — Progress Notes (Signed)
Hello [place],  Your lab result is normal and/or stable.Some minor variations that are not significant are commonly marked abnormal, but do not represent any medical problem for you.  Best regards, [name], M.D.

## 2022-09-08 NOTE — Progress Notes (Signed)
Called patient regarding LDCT follow-up. Patient tells me that he is interested in a follow-up, but wishes that I contact him at a later date to discuss further.

## 2022-09-13 DIAGNOSIS — H2511 Age-related nuclear cataract, right eye: Secondary | ICD-10-CM | POA: Diagnosis not present

## 2022-09-13 DIAGNOSIS — H2513 Age-related nuclear cataract, bilateral: Secondary | ICD-10-CM | POA: Diagnosis not present

## 2022-09-13 DIAGNOSIS — H2512 Age-related nuclear cataract, left eye: Secondary | ICD-10-CM | POA: Diagnosis not present

## 2022-09-23 DIAGNOSIS — H269 Unspecified cataract: Secondary | ICD-10-CM | POA: Diagnosis not present

## 2022-09-23 DIAGNOSIS — H2512 Age-related nuclear cataract, left eye: Secondary | ICD-10-CM | POA: Diagnosis not present

## 2022-10-07 DIAGNOSIS — H269 Unspecified cataract: Secondary | ICD-10-CM | POA: Diagnosis not present

## 2022-10-07 DIAGNOSIS — H2511 Age-related nuclear cataract, right eye: Secondary | ICD-10-CM | POA: Diagnosis not present

## 2022-10-19 ENCOUNTER — Other Ambulatory Visit: Payer: Self-pay

## 2022-10-19 DIAGNOSIS — Z87891 Personal history of nicotine dependence: Secondary | ICD-10-CM

## 2022-10-19 DIAGNOSIS — Z122 Encounter for screening for malignant neoplasm of respiratory organs: Secondary | ICD-10-CM

## 2022-10-19 NOTE — Progress Notes (Signed)
LDCT order placed per protocol. Scan scheduled for 04/03 at 0900.

## 2022-11-12 DIAGNOSIS — H6123 Impacted cerumen, bilateral: Secondary | ICD-10-CM | POA: Diagnosis not present

## 2022-12-08 ENCOUNTER — Encounter: Payer: Self-pay | Admitting: *Deleted

## 2022-12-08 ENCOUNTER — Ambulatory Visit (HOSPITAL_COMMUNITY)
Admission: RE | Admit: 2022-12-08 | Discharge: 2022-12-08 | Disposition: A | Discharge status: Home / Self Care | Payer: Medicare HMO | Source: Ambulatory Visit | Attending: Physician Assistant | Admitting: Physician Assistant

## 2022-12-08 DIAGNOSIS — Z87891 Personal history of nicotine dependence: Secondary | ICD-10-CM | POA: Diagnosis not present

## 2022-12-08 DIAGNOSIS — F17210 Nicotine dependence, cigarettes, uncomplicated: Secondary | ICD-10-CM | POA: Diagnosis not present

## 2022-12-08 DIAGNOSIS — Z122 Encounter for screening for malignant neoplasm of respiratory organs: Secondary | ICD-10-CM | POA: Diagnosis not present

## 2022-12-08 NOTE — Progress Notes (Signed)
Received call report from [facility] to inform of abnormal CT lung screening.  Russell Abernethy PA-C notified of result.

## 2022-12-10 NOTE — Progress Notes (Signed)
Patient notified of LDCT Lung Cancer Screening Patient's referring provider has been sent a copy of results and asked to make appropriate referrals per results. Results are as follows:  IMPRESSION: 1. Enlarging mixed solid and subsolid lesion in the left upper lobe highly concerning for primary bronchogenic adenocarcinoma categorized as Lung-RADS 4BS, suspicious. Additional imaging evaluation or consultation with Pulmonology or Thoracic Surgery recommended. 2. The "S" modifier above refers to potentially clinically significant non lung cancer related findings. Specifically, there is aortic atherosclerosis, in addition to two-vessel coronary artery disease. Please note that although the presence of coronary artery calcium documents the presence of coronary artery disease, the severity of this disease and any potential stenosis cannot be assessed on this non-gated CT examination. Assessment for potential risk factor modification, dietary therapy or pharmacologic therapy may be warranted, if clinically indicated. 3. Mild diffuse bronchial wall thickening with mild centrilobular and paraseptal emphysema; imaging findings suggestive of underlying COPD.   These results will be called to the ordering clinician or representative by the Radiologist Assistant, and communication documented in the PACS or [employer].   Aortic Atherosclerosis (ICD10-I70.0) and Emphysema (ICD10-J43.9).

## 2022-12-14 DIAGNOSIS — D485 Neoplasm of uncertain behavior of skin: Secondary | ICD-10-CM | POA: Diagnosis not present

## 2022-12-14 DIAGNOSIS — H04203 Unspecified epiphora, bilateral lacrimal glands: Secondary | ICD-10-CM | POA: Diagnosis not present

## 2022-12-21 DIAGNOSIS — D485 Neoplasm of uncertain behavior of skin: Secondary | ICD-10-CM | POA: Diagnosis not present

## 2022-12-21 DIAGNOSIS — L989 Disorder of the skin and subcutaneous tissue, unspecified: Secondary | ICD-10-CM | POA: Diagnosis not present

## 2023-02-28 ENCOUNTER — Encounter: Payer: Medicare HMO | Admitting: Family Medicine

## 2023-03-31 ENCOUNTER — Telehealth: Payer: Self-pay | Admitting: Family Medicine

## 2023-03-31 ENCOUNTER — Ambulatory Visit: Payer: Medicare HMO | Admitting: Family Medicine

## 2023-03-31 ENCOUNTER — Encounter: Payer: Self-pay | Admitting: Family Medicine

## 2023-03-31 VITALS — BP 145/70 | HR 46 | Temp 97.7°F | Ht 70.0 in | Wt 139.4 lb

## 2023-03-31 DIAGNOSIS — Z13220 Encounter for screening for lipoid disorders: Secondary | ICD-10-CM

## 2023-03-31 DIAGNOSIS — E559 Vitamin D deficiency, unspecified: Secondary | ICD-10-CM

## 2023-03-31 DIAGNOSIS — R918 Other nonspecific abnormal finding of lung field: Secondary | ICD-10-CM

## 2023-03-31 DIAGNOSIS — Z0000 Encounter for general adult medical examination without abnormal findings: Secondary | ICD-10-CM

## 2023-03-31 DIAGNOSIS — I10 Essential (primary) hypertension: Secondary | ICD-10-CM | POA: Diagnosis not present

## 2023-03-31 DIAGNOSIS — Z0001 Encounter for general adult medical examination with abnormal findings: Secondary | ICD-10-CM

## 2023-03-31 LAB — URINALYSIS
Bilirubin, UA: NEGATIVE
Glucose, UA: NEGATIVE
Ketones, UA: NEGATIVE
Leukocytes,UA: NEGATIVE
Nitrite, UA: NEGATIVE
Protein,UA: NEGATIVE
RBC, UA: NEGATIVE
Specific Gravity, UA: 1.015 (ref 1.005–1.030)
Urobilinogen, Ur: 0.2 mg/dL (ref 0.2–1.0)
pH, UA: 6 (ref 5.0–7.5)

## 2023-03-31 LAB — CMP14+EGFR: Total Protein: 6.7 g/dL (ref 6.0–8.5)

## 2023-03-31 LAB — LIPID PANEL

## 2023-03-31 LAB — CBC WITH DIFFERENTIAL/PLATELET
Basophils Absolute: 0.1 10*3/uL (ref 0.0–0.2)
Basos: 1 %
EOS (ABSOLUTE): 0.3 10*3/uL (ref 0.0–0.4)
Eos: 3 %
Hematocrit: 47.3 % (ref 37.5–51.0)
Hemoglobin: 16 g/dL (ref 13.0–17.7)
Immature Grans (Abs): 0 10*3/uL (ref 0.0–0.1)
Immature Granulocytes: 0 %
Lymphocytes Absolute: 2.5 10*3/uL (ref 0.7–3.1)
Lymphs: 26 %
MCH: 32.9 pg (ref 26.6–33.0)
MCHC: 33.8 g/dL (ref 31.5–35.7)
MCV: 97 fL (ref 79–97)
Monocytes Absolute: 0.7 10*3/uL (ref 0.1–0.9)
Monocytes: 7 %
Neutrophils Absolute: 5.9 10*3/uL (ref 1.4–7.0)
Neutrophils: 63 %
Platelets: 240 10*3/uL (ref 150–450)
RBC: 4.86 x10E6/uL (ref 4.14–5.80)
RDW: 12.3 % (ref 11.6–15.4)
WBC: 9.5 10*3/uL (ref 3.4–10.8)

## 2023-03-31 MED ORDER — OLMESARTAN MEDOXOMIL 40 MG PO TABS: 40.0000 mg | ORAL_TABLET | Freq: Every day | ORAL | 3 refills | Status: DC

## 2023-03-31 NOTE — Telephone Encounter (Signed)
CT already done.

## 2023-03-31 NOTE — Telephone Encounter (Signed)
I don't like it. I will keep working on it.

## 2023-03-31 NOTE — Progress Notes (Signed)
Subjective:  Patient ID: Russell [name], male    DOB: 07-06-1951  Age: 72 y.o. MRN: [id number]  CC: Annual Exam   HPI Russell [name] presents for Complete physical. Notes that he hasyet to hear from his CT lung from April.     03/31/2023    9:03 AM 03/31/2023    8:56 AM 08/26/2022    3:57 PM  Depression screen PHQ 2/9  Decreased Interest 1 0 1  Down, Depressed, Hopeless 1 0 0  PHQ - 2 Score 2 0 1  Altered sleeping 1  0  Tired, decreased energy 1  1  Change in appetite 1  0  Feeling bad or failure about yourself  0  0  Trouble concentrating 1  0  Moving slowly or fidgety/restless 0  0  Suicidal thoughts 0  0  PHQ-9 Score 6  2  Difficult doing work/chores Somewhat difficult  Not difficult at all    History Russell [name] has a past medical history of adenomatous polyp of colon (03/24/2016), Hyperlipidemia, and Hypertension.   He has a past surgical history that includes Hernia repair; Fracture surgery (Left, age 33); Wisdom tooth extraction; Colonoscopy (09/07/2003); Upper gi endoscopy; Upper gastrointestinal endoscopy; and cyst removal from gum.   His family history includes Diabetes in his mother; Heart disease in his mother; Hyperlipidemia in his father.He reports that he has been smoking cigarettes. He has a 45 pack-year smoking history. He has never used smokeless tobacco. He reports current alcohol use of about 7.0 standard drinks of alcohol per week. He reports that he does not use drugs.    ROS Review of Systems  Constitutional:  Negative for activity change, fatigue, fever and unexpected weight change.  HENT:  Negative for congestion, ear pain, hearing loss, postnasal drip and trouble swallowing.   Eyes:  Negative for pain and visual disturbance.  Respiratory:  Negative for cough, chest tightness and shortness of breath.   Cardiovascular:  Negative for chest pain, palpitations and leg swelling.  Gastrointestinal:  Negative for abdominal distention, abdominal pain, blood in  stool, constipation, diarrhea, nausea and vomiting.  Endocrine: Negative for cold intolerance, heat intolerance and polydipsia.  Genitourinary:  Negative for difficulty urinating, dysuria, flank pain, frequency and urgency.  Musculoskeletal:  Negative for arthralgias and joint swelling.  Skin:  Negative for color change, rash and wound.  Neurological:  Negative for dizziness, syncope, speech difficulty, weakness, light-headedness, numbness and headaches.  Hematological:  Does not bruise/bleed easily.  Psychiatric/Behavioral:  Negative for confusion, decreased concentration, dysphoric mood and sleep disturbance. The patient is not nervous/anxious.     Objective:  BP (!) 145/70   Pulse (!) 46   Temp 97.7 F (36.5 C)   Ht 5\' 10"  (1.778 m)   Wt 139 lb 6.4 oz (63.2 kg)   SpO2 97%   BMI 20.00 kg/m   BP Readings from Last 3 Encounters:  03/31/23 (!) 145/70  08/26/22 133/62  03/03/22 130/69    Wt Readings from Last 3 Encounters:  03/31/23 139 lb 6.4 oz (63.2 kg)  03/03/22 149 lb (67.6 kg)  09/02/21 150 lb 12.8 oz (68.4 kg)     Physical Exam Constitutional:      Appearance: He is well-developed.  HENT:     Head: Normocephalic and atraumatic.  Eyes:     Pupils: Pupils are equal, round, and reactive to light.  Neck:     Thyroid: No thyromegaly.     Trachea: No tracheal deviation.  Cardiovascular:  Rate and Rhythm: Normal rate and regular rhythm.     Heart sounds: Normal heart sounds. No murmur heard.    No friction rub. No gallop.  Pulmonary:     Breath sounds: Normal breath sounds. No wheezing or rales.  Abdominal:     General: Bowel sounds are normal. There is no distension.     Palpations: Abdomen is soft. There is no mass.     Tenderness: There is no abdominal tenderness.     Hernia: There is no hernia in the left inguinal area.  Genitourinary:    Penis: Normal.      Testes: Normal.  Musculoskeletal:        General: Normal range of motion.     Cervical back:  Normal range of motion.  Lymphadenopathy:     Cervical: No cervical adenopathy.  Skin:    General: Skin is warm and dry.  Neurological:     Mental Status: He is alert and oriented to person, place, and time.       Assessment & Plan:   Russell [name] was seen today for annual exam.  Diagnoses and all orders for this visit:  Physical exam, annual -     CBC with Differential/Platelet -     CMP14+EGFR -     Lipid panel -     Urinalysis -     VITAMIN D 25 Hydroxy (Vit-D Deficiency, Fractures)  Primary hypertension -     CBC with Differential/Platelet -     CMP14+EGFR -     olmesartan (BENICAR) 40 MG tablet; Take 1 tablet (40 mg total) by mouth daily. For blood pressure  Lipid screening -     Lipid panel  Vitamin D deficiency -     VITAMIN D 25 Hydroxy (Vit-D Deficiency, Fractures)  Mass of upper lobe of left lung -     Ambulatory referral to Pulmonology       I am having Tali C. Bohl maintain his aspirin EC, Fish Oil, Vitamin D, Ascorbic Acid (VITAMIN C PO), augmented betamethasone dipropionate, B Complex Vitamins (B-COMPLEX/B-12 PO), amLODipine, and olmesartan.  Allergies as of 03/31/2023   No Known Allergies      Medication List        Accurate as of March 31, 2023 11:59 PM. If you have any questions, ask your nurse or doctor.          amLODipine 5 MG tablet Commonly known as: NORVASC TAKE 1 TABLET EVERY DAY  FOR BLOOD PRESSURE   aspirin EC 81 MG tablet Take 81 mg by mouth every other day.   augmented betamethasone dipropionate 0.05 % cream Commonly known as: DIPROLENE-AF Apply topically 2 (two) times daily. At affected areas (avoid face and genitals)   B-COMPLEX/B-12 PO Take 1,000 mcg by mouth daily.   Fish Oil 1000 MG Caps Take 1,000 mg by mouth.   olmesartan 40 MG tablet Commonly known as: BENICAR Take 1 tablet (40 mg total) by mouth daily. For blood pressure   VITAMIN C PO Take by mouth.   Vitamin D 50 MCG (2000 UT) Caps Take 2,000 Units  by mouth daily.         Follow-up: Return in about 1 month (around 05/01/2023), or for lung mass status.  Mechele Claude, M.D.

## 2023-03-31 NOTE — Telephone Encounter (Signed)
Is the CT ordered stat? Or is there something else we can do to get a sooner appt?

## 2023-03-31 NOTE — Telephone Encounter (Signed)
Patient wants to left Dr Darlyn Read know that his appt is scheduled for 04/27/23 for the mass in his left lung. He wanted PCP to know this was the first available and be sure it was okay to wait this long.

## 2023-04-01 LAB — VITAMIN D 25 HYDROXY (VIT D DEFICIENCY, FRACTURES): Vit D, 25-Hydroxy: 105 ng/mL — ABNORMAL HIGH (ref 30.0–100.0)

## 2023-04-01 NOTE — Telephone Encounter (Signed)
Patient aware and verbalized understanding. He just would like to keep him updated if we find out anything new. FYI

## 2023-04-03 ENCOUNTER — Encounter: Payer: Self-pay | Admitting: Family Medicine

## 2023-04-27 ENCOUNTER — Ambulatory Visit: Payer: Medicare HMO | Admitting: Emergency Medicine

## 2023-04-27 ENCOUNTER — Encounter: Payer: Self-pay | Admitting: Emergency Medicine

## 2023-04-27 VITALS — BP 120/62 | HR 54 | Ht 70.0 in | Wt 139.4 lb

## 2023-04-27 DIAGNOSIS — R911 Solitary pulmonary nodule: Secondary | ICD-10-CM | POA: Insufficient documentation

## 2023-04-27 HISTORY — DX: Solitary pulmonary nodule: R91.1

## 2023-04-27 NOTE — Progress Notes (Signed)
Subjective:    Patient ID: Russell [name], male    DOB: 26-Sep-1950, 72 y.o.   MRN: [id number]  HPI 72 year old active smoker (45 pack years) with a history of hypertension, colon adenomatous polyp, hyperlipidemia.  He is referred today for an abnormal CT scan of the chest.  He participates in lung cancer screening program.  He is working on decreasing his smoking. He can get some exertional SOB (heavy activity). He has daily cough, not always productive.   CT scan of the chest 12/08/2022 reviewed by me shows no mediastinal or hilar adenopathy.  There is a left upper lobe cystic lesion with a thin wall and some slightly increased solid component peripherally.  Measures 26 mm, solid component 11 mm.  Also other scattered solid pulmonary nodules that are unchanged   Review of Systems As per HPI  Past Medical History:  Diagnosis Date   Hx of adenomatous polyp of colon 03/24/2016   Hyperlipidemia    borderline- no meds- diet controlled   Hypertension      Family History  Problem Relation Age of Onset   Heart disease Mother    Diabetes Mother    Hyperlipidemia Father    Colon cancer Neg Hx    Esophageal cancer Neg Hx    Rectal cancer Neg Hx    Stomach cancer Neg Hx    Pancreatic cancer Neg Hx    Prostate cancer Neg Hx    Colon polyps Neg Hx      Social History   Socioeconomic History   Marital status: Married    Spouse name: Not on file   Number of children: Not on file   Years of education: Not on file   Highest education level: Not on file  Occupational History   Not on file  Tobacco Use   Smoking status: Every Day    Current packs/day: 1.00    Average packs/day: 1 pack/day for 45.0 years (45.0 ttl pk-yrs)    Types: Cigarettes   Smokeless tobacco: Never  Vaping Use   Vaping status: Never Used  Substance and Sexual Activity   Alcohol use: Yes    Alcohol/week: 7.0 standard drinks of alcohol    Types: 7 Shots of liquor per week   Drug use: Never   Sexual activity: Yes   Other Topics Concern   Not on file  Social History Narrative   Not on file   Social Determinants of Health   Financial Resource Strain: Not on file  Food Insecurity: Not on file  Transportation Needs: Not on file  Physical Activity: Not on file  Stress: Not on file  Social Connections: Not on file  Intimate Partner Violence: Not on file    He is retired, worked in tobacco factory  No Lobbyist, none currently   No Known Allergies   Outpatient Medications Prior to Visit  Medication Sig Dispense Refill   amLODipine (NORVASC) 5 MG tablet TAKE 1 TABLET EVERY DAY  FOR BLOOD PRESSURE 90 tablet 3   Ascorbic Acid (VITAMIN C PO) Take by mouth.     aspirin EC 81 MG tablet Take 81 mg by mouth every other day.      augmented betamethasone dipropionate (DIPROLENE-AF) 0.05 % cream Apply topically 2 (two) times daily. At affected areas (avoid face and genitals) 150 g 3   B Complex Vitamins (B-COMPLEX/B-12 PO) Take 1,000 mcg by mouth daily.     Cholecalciferol (VITAMIN D) 2000 units CAPS Take  2,000 Units by mouth daily.     olmesartan (BENICAR) 40 MG tablet Take 1 tablet (40 mg total) by mouth daily. For blood pressure 90 tablet 3   Omega-3 Fatty Acids (FISH OIL) 1000 MG CAPS Take 1,000 mg by mouth.     No facility-administered medications prior to visit.        Objective:   Physical Exam Vitals:   04/27/23 1445  BP: 120/62  Pulse: (!) 54  SpO2: 96%  Weight: 139 lb 6.4 oz (63.2 kg)  Height: 5\' 10"  (1.778 m)   Gen: Pleasant, well-nourished, in no distress,  normal affect  ENT: No lesions,  mouth clear,  oropharynx clear, no postnasal drip  Neck: No JVD, no stridor  Lungs: No use of accessory muscles, few scattered rhonchi.  No wheezes  Cardiovascular: RRR, heart sounds normal, no murmur or gallops, no peripheral edema  Musculoskeletal: No deformities, no cyanosis or clubbing  Neuro: alert, awake, non focal  Skin: Warm, no  lesions or rash      Assessment & Plan:  Pulmonary nodule Interesting appearance left upper lobe nodule with an area of increased cavitation.  The wall around the cavity does look slightly thicker and more prominent.  Enough suspicion that we need to follow closely.  Discussed the differential diagnosis with him.  We will repeat his CT scan of the chest now, look for interval change.  If indicated we will arrange for either navigational bronchoscopy or possibly consider primary resection.   Levy Pupa, MD, PhD 04/27/2023, 3:30 PM [facility] [phone] or if no answer before 7:00PM call [phone] For any issues after 7:00PM please call eLink [phone]

## 2023-04-27 NOTE — Patient Instructions (Signed)
We reviewed your CT scans of the chest today.   We will arrange for a repeat CT scan of the chest at [facility] to compare with your priors. Continue to work on decreasing your cigarettes Follow Dr. Delton Coombes next available after the CT chest so we can review those results together and plan next steps.

## 2023-04-27 NOTE — Assessment & Plan Note (Signed)
Interesting appearance left upper lobe nodule with an area of increased cavitation.  The wall around the cavity does look slightly thicker and more prominent.  Enough suspicion that we need to follow closely.  Discussed the differential diagnosis with him.  We will repeat his CT scan of the chest now, look for interval change.  If indicated we will arrange for either navigational bronchoscopy or possibly consider primary resection.

## 2023-05-04 ENCOUNTER — Ambulatory Visit (HOSPITAL_COMMUNITY)
Admission: RE | Admit: 2023-05-04 | Discharge: 2023-05-04 | Disposition: A | Discharge status: Home / Self Care | Payer: Medicare HMO | Source: Ambulatory Visit | Attending: Emergency Medicine | Admitting: Emergency Medicine

## 2023-05-04 DIAGNOSIS — R911 Solitary pulmonary nodule: Secondary | ICD-10-CM | POA: Diagnosis not present

## 2023-05-04 DIAGNOSIS — I2510 Atherosclerotic heart disease of native coronary artery without angina pectoris: Secondary | ICD-10-CM | POA: Diagnosis not present

## 2023-05-04 DIAGNOSIS — I700 Atherosclerosis of aorta: Secondary | ICD-10-CM | POA: Diagnosis not present

## 2023-05-04 DIAGNOSIS — J432 Centrilobular emphysema: Secondary | ICD-10-CM | POA: Diagnosis not present

## 2023-05-13 ENCOUNTER — Telehealth: Payer: Self-pay | Admitting: Emergency Medicine

## 2023-05-13 DIAGNOSIS — R911 Solitary pulmonary nodule: Secondary | ICD-10-CM

## 2023-05-13 NOTE — Telephone Encounter (Signed)
Pt calling in for his CT results

## 2023-05-16 NOTE — Telephone Encounter (Signed)
I discussed the patient's CT chest with him by phone.  The left upper lobe cavitary lesion continues to be thin-walled, is slightly bigger.  There is some adjacent groundglass opacity, looks slightly different than the wall thickening that was seen on his previous CT.  Lesion remains somewhat suspicious.  We will obtain a PET scan to better characterize.  Pending on that result we will decide whether to pursue bronchoscopy or talk to surgery about primary resection.

## 2023-05-19 ENCOUNTER — Encounter (HOSPITAL_COMMUNITY)
Admission: RE | Admit: 2023-05-19 | Discharge: 2023-05-19 | Disposition: A | Discharge status: Home / Self Care | Payer: Medicare HMO | Source: Ambulatory Visit | Attending: Emergency Medicine | Admitting: Emergency Medicine

## 2023-05-19 DIAGNOSIS — R918 Other nonspecific abnormal finding of lung field: Secondary | ICD-10-CM | POA: Diagnosis not present

## 2023-05-19 DIAGNOSIS — R911 Solitary pulmonary nodule: Secondary | ICD-10-CM | POA: Diagnosis not present

## 2023-05-19 MED ORDER — FLUDEOXYGLUCOSE F - 18 (FDG) INJECTION
7.3000 | Freq: Once | INTRAVENOUS | Status: AC | PRN
  Administered 2023-05-19: 7.3 via INTRAVENOUS

## 2023-06-01 ENCOUNTER — Encounter: Payer: Self-pay | Admitting: Emergency Medicine

## 2023-06-01 ENCOUNTER — Ambulatory Visit: Payer: Medicare HMO | Admitting: Emergency Medicine

## 2023-06-01 VITALS — BP 132/70 | HR 52 | Ht 70.0 in | Wt 140.6 lb

## 2023-06-01 DIAGNOSIS — Z720 Tobacco use: Secondary | ICD-10-CM | POA: Diagnosis not present

## 2023-06-01 DIAGNOSIS — R911 Solitary pulmonary nodule: Secondary | ICD-10-CM

## 2023-06-01 NOTE — Assessment & Plan Note (Signed)
Needs to work on cessation.  He will have pulmonary function testing and we can discuss whether BD therapy would be possible beneficial.  He has good functional capacity currently.

## 2023-06-01 NOTE — Assessment & Plan Note (Signed)
Cavitary nodule.  On follow-up CT the walls have changed slightly, less thickness and different regional thickness.  The PET scan shows some very mild hypermetabolic activity peripherally and inferiorly at the chest wall, unclear significance.  We talked about possible bronchoscopy but clearly this would be a difficult place to sample given the thin cavity wall.  We also talked about possible surgical resection.  We are going to check pulmonary function testing to assess his suitability for possible surgery if he needs it.  In the meantime we will repeat his CT chest and PET scan in 3 months to look for interval change or stability.

## 2023-06-01 NOTE — Patient Instructions (Signed)
We reviewed your PET scan and your CT chest today.  Your cavitary nodule is stable in appearance, does not show significant PET scan activity.  It still needs to be followed closely. We will repeat a PET scan/CT chest in 3 months (December 2024). We will arrange for pulmonary function testing Follow Dr. Delton Coombes in December after your testing so we can review those results together.

## 2023-06-01 NOTE — Progress Notes (Signed)
Subjective:    Patient ID: Russell [name], male    DOB: 01/13/1951, 72 y.o.   MRN: [id number]  HPI 72 year old active smoker (45 pack years) with a history of hypertension, colon adenomatous polyp, hyperlipidemia.  He is referred today for an abnormal CT scan of the chest.  He participates in lung cancer screening program.  He is working on decreasing his smoking. He can get some exertional SOB (heavy activity). He has daily cough, not always productive.   CT scan of the chest 12/08/2022 reviewed by me shows no mediastinal or hilar adenopathy.  There is a left upper lobe cystic lesion with a thin wall and some slightly increased solid component peripherally.  Measures 26 mm, solid component 11 mm.  Also other scattered solid pulmonary nodules that are unchanged  ROV 06/01/2023 --Russell [name] is 72 and follows up today for an abnormal CT scan of his chest noted on screening CT 12/08/2022.  He is an active smoker, has a history of hypertension, colon polyps, hyperlipidemia.  His CT showed a cavitary left upper lobe nodule with some variability in wall thickness.  We repeated his scan as below, then PET scan on 05/19/2023.  CT scan of the chest 05/04/2023 reviewed by me showed that the left upper lobe cavitary lesion was somewhat larger with slightly decreased circumferential wall thickening.  There was also a new lingular clustered nodularity mucoid impaction, question active inflammation.  PET scan 05/19/2023 reviewed by me, shows    Review of Systems As per HPI  Past Medical History:  Diagnosis Date   Hx of adenomatous polyp of colon 03/24/2016   Hyperlipidemia    borderline- no meds- diet controlled   Hypertension      Family History  Problem Relation Age of Onset   Heart disease Mother    Diabetes Mother    Hyperlipidemia Father    Colon cancer Neg Hx    Esophageal cancer Neg Hx    Rectal cancer Neg Hx    Stomach cancer Neg Hx    Pancreatic cancer Neg Hx    Prostate cancer Neg Hx    Colon  polyps Neg Hx      Social History   Socioeconomic History   Marital status: Married    Spouse name: Not on file   Number of children: Not on file   Years of education: Not on file   Highest education level: Not on file  Occupational History   Not on file  Tobacco Use   Smoking status: Every Day    Current packs/day: 1.00    Average packs/day: 1 pack/day for 45.0 years (45.0 ttl pk-yrs)    Types: Cigarettes   Smokeless tobacco: Never  Vaping Use   Vaping status: Never Used  Substance and Sexual Activity   Alcohol use: Yes    Alcohol/week: 7.0 standard drinks of alcohol    Types: 7 Shots of liquor per week   Drug use: Never   Sexual activity: Yes  Other Topics Concern   Not on file  Social History Narrative   Not on file   Social Determinants of Health   Financial Resource Strain: Not on file  Food Insecurity: Not on file  Transportation Needs: Not on file  Physical Activity: Not on file  Stress: Not on file  Social Connections: Not on file  Intimate Partner Violence: Not on file    He is retired, worked in tobacco factory  No Lobbyist, none currently  No Known Allergies   Outpatient Medications Prior to Visit  Medication Sig Dispense Refill   amLODipine (NORVASC) 5 MG tablet TAKE 1 TABLET EVERY DAY  FOR BLOOD PRESSURE 90 tablet 3   Ascorbic Acid (VITAMIN C PO) Take by mouth.     aspirin EC 81 MG tablet Take 81 mg by mouth every other day.      augmented betamethasone dipropionate (DIPROLENE-AF) 0.05 % cream Apply topically 2 (two) times daily. At affected areas (avoid face and genitals) 150 g 3   B Complex Vitamins (B-COMPLEX/B-12 PO) Take 1,000 mcg by mouth daily.     Cholecalciferol (VITAMIN D) 2000 units CAPS Take 2,000 Units by mouth daily.     olmesartan (BENICAR) 40 MG tablet Take 1 tablet (40 mg total) by mouth daily. For blood pressure 90 tablet 3   Omega-3 Fatty Acids (FISH OIL) 1000 MG CAPS Take 1,000 mg  by mouth.     No facility-administered medications prior to visit.        Objective:   Physical Exam Vitals:   06/01/23 0920  BP: 132/70  Pulse: (!) 52  SpO2: 97%  Weight: 140 lb 9.6 oz (63.8 kg)  Height: 5\' 10"  (1.778 m)   Gen: Pleasant, well-nourished, in no distress,  normal affect  ENT: No lesions,  mouth clear,  oropharynx clear, no postnasal drip  Neck: No JVD, no stridor  Lungs: No use of accessory muscles, few scattered rhonchi.  No wheezes  Cardiovascular: RRR, heart sounds normal, no murmur or gallops, no peripheral edema  Musculoskeletal: No deformities, no cyanosis or clubbing  Neuro: alert, awake, non focal  Skin: Warm, no lesions or rash      Assessment & Plan:  Pulmonary nodule Cavitary nodule.  On follow-up CT the walls have changed slightly, less thickness and different regional thickness.  The PET scan shows some very mild hypermetabolic activity peripherally and inferiorly at the chest wall, unclear significance.  We talked about possible bronchoscopy but clearly this would be a difficult place to sample given the thin cavity wall.  We also talked about possible surgical resection.  We are going to check pulmonary function testing to assess his suitability for possible surgery if he needs it.  In the meantime we will repeat his CT chest and PET scan in 3 months to look for interval change or stability.  Tobacco use Needs to work on cessation.  He will have pulmonary function testing and we can discuss whether BD therapy would be possible beneficial.  He has good functional capacity currently.    Levy Pupa, MD, PhD 06/01/2023, 10:08 AM [facility] [phone] or if no answer before 7:00PM call [phone] For any issues after 7:00PM please call eLink [phone]

## 2023-06-27 DIAGNOSIS — H04123 Dry eye syndrome of bilateral lacrimal glands: Secondary | ICD-10-CM | POA: Diagnosis not present

## 2023-08-11 ENCOUNTER — Ambulatory Visit: Payer: Medicare HMO | Admitting: Emergency Medicine

## 2023-09-02 DIAGNOSIS — H5213 Myopia, bilateral: Secondary | ICD-10-CM | POA: Diagnosis not present

## 2023-09-09 ENCOUNTER — Encounter (HOSPITAL_COMMUNITY)
Admission: RE | Admit: 2023-09-09 | Discharge: 2023-09-09 | Disposition: A | Discharge status: Home / Self Care | Payer: Medicare HMO | Source: Ambulatory Visit | Attending: Emergency Medicine | Admitting: Emergency Medicine

## 2023-09-09 ENCOUNTER — Encounter: Payer: Self-pay | Admitting: Emergency Medicine

## 2023-09-09 DIAGNOSIS — R911 Solitary pulmonary nodule: Secondary | ICD-10-CM | POA: Insufficient documentation

## 2023-09-09 DIAGNOSIS — R918 Other nonspecific abnormal finding of lung field: Secondary | ICD-10-CM | POA: Diagnosis not present

## 2023-09-09 LAB — GLUCOSE, CAPILLARY: Glucose-Capillary: 81 mg/dL (ref 70–99)

## 2023-09-09 MED ORDER — FLUDEOXYGLUCOSE F - 18 (FDG) INJECTION
7.0000 | Freq: Once | INTRAVENOUS | Status: AC
  Administered 2023-09-09: 6.98 via INTRAVENOUS

## 2023-09-12 ENCOUNTER — Other Ambulatory Visit: Payer: Self-pay | Admitting: Emergency Medicine

## 2023-09-12 DIAGNOSIS — R911 Solitary pulmonary nodule: Secondary | ICD-10-CM

## 2023-09-13 ENCOUNTER — Ambulatory Visit (HOSPITAL_COMMUNITY)
Admission: RE | Admit: 2023-09-13 | Discharge: 2023-09-13 | Disposition: A | Discharge status: Home / Self Care | Payer: Medicare HMO | Source: Ambulatory Visit | Attending: Emergency Medicine | Admitting: Emergency Medicine

## 2023-09-13 DIAGNOSIS — L720 Epidermal cyst: Secondary | ICD-10-CM | POA: Diagnosis not present

## 2023-09-13 DIAGNOSIS — R911 Solitary pulmonary nodule: Secondary | ICD-10-CM | POA: Diagnosis not present

## 2023-09-29 ENCOUNTER — Ambulatory Visit: Payer: Medicare HMO | Admitting: Emergency Medicine

## 2023-09-29 ENCOUNTER — Encounter: Payer: Self-pay | Admitting: Emergency Medicine

## 2023-09-29 VITALS — BP 119/60 | HR 53 | Ht 69.0 in | Wt 139.0 lb

## 2023-09-29 DIAGNOSIS — J449 Chronic obstructive pulmonary disease, unspecified: Secondary | ICD-10-CM | POA: Diagnosis not present

## 2023-09-29 DIAGNOSIS — R911 Solitary pulmonary nodule: Secondary | ICD-10-CM

## 2023-09-29 LAB — PULMONARY FUNCTION TEST
DL/VA % pred: 79 %
DL/VA: 3.22 ml/min/mmHg/L
DLCO cor % pred: 70 %
DLCO cor: 17.43 ml/min/mmHg
DLCO unc % pred: 70 %
DLCO unc: 17.43 ml/min/mmHg
FEF 25-75 Post: 1.4 L/s
FEF 25-75 Pre: 0.7 L/s
FEF2575-%Change-Post: 101 %
FEF2575-%Pred-Post: 61 %
FEF2575-%Pred-Pre: 30 %
FEV1-%Change-Post: 32 %
FEV1-%Pred-Post: 62 %
FEV1-%Pred-Pre: 47 %
FEV1-Post: 1.92 L
FEV1-Pre: 1.45 L
FEV1FVC-%Change-Post: 12 %
FEV1FVC-%Pred-Pre: 72 %
FEV6-%Change-Post: 18 %
FEV6-%Pred-Post: 81 %
FEV6-%Pred-Pre: 68 %
FEV6-Post: 3.2 L
FEV6-Pre: 2.69 L
FEV6FVC-%Change-Post: 0 %
FEV6FVC-%Pred-Post: 105 %
FEV6FVC-%Pred-Pre: 105 %
FVC-%Change-Post: 18 %
FVC-%Pred-Post: 76 %
FVC-%Pred-Pre: 65 %
FVC-Post: 3.22 L
FVC-Pre: 2.72 L
Post FEV1/FVC ratio: 60 %
Post FEV6/FVC ratio: 99 %
Pre FEV1/FVC ratio: 53 %
Pre FEV6/FVC Ratio: 99 %
RV % pred: 170 %
RV: 4.16 L
TLC % pred: 108 %
TLC: 7.43 L

## 2023-09-29 NOTE — Patient Instructions (Signed)
Full PFT performed today. °

## 2023-09-29 NOTE — Assessment & Plan Note (Signed)
PFT confirm obstruction. He is minimally symptomatic.  We reviewed your pulmonary function testing. Keep albuterol available to use 2 puffs if needed for shortness of breath, chest tightness, wheezing. We will hold off on starting a scheduled inhaler medication for now.  We may consider this going forward depending on how your symptoms are doing.

## 2023-09-29 NOTE — Patient Instructions (Signed)
We reviewed your PET scan today. We will arrange for a navigational bronchoscopy to evaluate a left upper lobe cavitary nodule.  This will be done as an outpatient under general anesthesia at [facility] endoscopy.  Will try to get this scheduled for 10/17/2023.  You will need to stop your aspirin 2 days prior.  You will need a designated driver and someone to watch you at home on that day after the procedure. We reviewed your pulmonary function testing. Keep albuterol available to use 2 puffs if needed for shortness of breath, chest tightness, wheezing. We will hold off on starting a scheduled inhaler medication for now.  We may consider this going forward depending on how your symptoms are doing.

## 2023-09-29 NOTE — Assessment & Plan Note (Signed)
Cavitary nodule, now some progression of the lateral wall, associated hypermetabolism. Unclear cause, consider indolent infection (? Fungal) versus malignancy. Have recommended bronchoscopy for tissue and cx data  We reviewed your PET scan today. We will arrange for a navigational bronchoscopy to evaluate a left upper lobe cavitary nodule.  This will be done as an outpatient under general anesthesia at [facility] endoscopy.  Will try to get this scheduled for 10/17/2023.  You will need to stop your aspirin 2 days prior.  You will need a designated driver and someone to watch you at home on that day after the procedure.

## 2023-09-29 NOTE — Progress Notes (Signed)
Full PFT performed today. °

## 2023-09-29 NOTE — Progress Notes (Signed)
Subjective:    Patient ID: Russell [name], male    DOB: 01-23-1951, 73 y.o.   MRN: [id number]  HPI  ROV 09/29/2023 --73 year old active smoker (45 pack years with a history of hypertension, hyperlipidemia, presumed COPD.  I saw him for cavitary left upper lobe nodule with variable wall thickness with some subtle adjacent groundglass.  We performed a PET scan to further characterize.  He does not have any dyspnea, good functional capacity. He has some daily cough, less productive.   PET scan 09/09/2023 reviewed by me shows a 3.4 cm left upper lobe cavitary mass with a thin medial wall but a thick lateral wall.  The maximum SUV in the lateral aspect is 14.4 suggestive of either malignancy or an active granulomatous process such as fungal disease.  No evidence of distant disease or metastatic disease.   Review of Systems As per HPI  Past Medical History:  Diagnosis Date   Hx of adenomatous polyp of colon 03/24/2016   Hyperlipidemia    borderline- no meds- diet controlled   Hypertension      Family History  Problem Relation Age of Onset   Heart disease Mother    Diabetes Mother    Hyperlipidemia Father    Colon cancer Neg Hx    Esophageal cancer Neg Hx    Rectal cancer Neg Hx    Stomach cancer Neg Hx    Pancreatic cancer Neg Hx    Prostate cancer Neg Hx    Colon polyps Neg Hx      Social History   Socioeconomic History   Marital status: Married    Spouse name: Not on file   Number of children: Not on file   Years of education: Not on file   Highest education level: Bachelor's degree (e.g., BA, AB, BS)  Occupational History   Not on file  Tobacco Use   Smoking status: Every Day    Current packs/day: 1.00    Average packs/day: 1 pack/day for 45.0 years (45.0 ttl pk-yrs)    Types: Cigarettes   Smokeless tobacco: Never  Vaping Use   Vaping status: Never Used  Substance and Sexual Activity   Alcohol use: Yes    Alcohol/week: 7.0 standard drinks of alcohol    Types: 7  Shots of liquor per week   Drug use: Never   Sexual activity: Yes  Other Topics Concern   Not on file  Social History Narrative   Not on file   Social Drivers of Health   Financial Resource Strain: Low Risk  (09/29/2023)   Overall Financial Resource Strain (CARDIA)    Difficulty of Paying Living Expenses: Not hard at all  Food Insecurity: No Food Insecurity (09/29/2023)   Hunger Vital Sign    Worried About Running Out of Food in the Last Year: Never true    Ran Out of Food in the Last Year: Never true  Transportation Needs: No Transportation Needs (09/29/2023)   PRAPARE - Administrator, Civil Service (Medical): No    Lack of Transportation (Non-Medical): No  Physical Activity: Insufficiently Active (09/29/2023)   Exercise Vital Sign    Days of Exercise per Week: 1 day    Minutes of Exercise per Session: 10 min  Stress: No Stress Concern Present (09/29/2023)   [employer] of Occupational Health - Occupational Stress Questionnaire    Feeling of Stress : Only a little  Social Connections: Moderately Isolated (09/29/2023)   Social Connection and Isolation Panel [NHANES]  Frequency of Communication with Friends and Family: Once a week    Frequency of Social Gatherings with Friends and Family: Once a week    Attends Religious Services: More than 4 times per year    Active Member of [employer] or Organizations: No    Attends Engineer, structural: Not on file    Marital Status: Married  Catering manager Violence: Not on file    He is retired, worked in tobacco factory  No Lobbyist, none currently   No Known Allergies   Outpatient Medications Prior to Visit  Medication Sig Dispense Refill   amLODipine (NORVASC) 5 MG tablet TAKE 1 TABLET EVERY DAY  FOR BLOOD PRESSURE 90 tablet 3   Ascorbic Acid (VITAMIN C PO) Take by mouth.     aspirin EC 81 MG tablet Take 81 mg by mouth every other day.      augmented betamethasone  dipropionate (DIPROLENE-AF) 0.05 % cream Apply topically 2 (two) times daily. At affected areas (avoid face and genitals) 150 g 3   B Complex Vitamins (B-COMPLEX/B-12 PO) Take 1,000 mcg by mouth daily.     Cholecalciferol (VITAMIN D) 2000 units CAPS Take 2,000 Units by mouth daily.     olmesartan (BENICAR) 40 MG tablet Take 1 tablet (40 mg total) by mouth daily. For blood pressure 90 tablet 3   Omega-3 Fatty Acids (FISH OIL) 1000 MG CAPS Take 1,000 mg by mouth.     No facility-administered medications prior to visit.        Objective:   Physical Exam Vitals:   09/29/23 1534  BP: 119/60  Pulse: (!) 53  SpO2: 93%  Weight: 139 lb (63 kg)  Height: 5\' 9"  (1.753 m)   Gen: Pleasant, well-nourished, in no distress,  normal affect  ENT: No lesions,  mouth clear,  oropharynx clear, no postnasal drip  Neck: No JVD, no stridor  Lungs: No use of accessory muscles, few scattered rhonchi.  No wheezes  Cardiovascular: RRR, heart sounds normal, no murmur or gallops, no peripheral edema  Musculoskeletal: No deformities, no cyanosis or clubbing  Neuro: alert, awake, non focal  Skin: Warm, no lesions or rash      Assessment & Plan:  Pulmonary nodule Cavitary nodule, now some progression of the lateral wall, associated hypermetabolism. Unclear cause, consider indolent infection (? Fungal) versus malignancy. Have recommended bronchoscopy for tissue and cx data  We reviewed your PET scan today. We will arrange for a navigational bronchoscopy to evaluate a left upper lobe cavitary nodule.  This will be done as an outpatient under general anesthesia at [facility] endoscopy.  Will try to get this scheduled for 10/17/2023.  You will need to stop your aspirin 2 days prior.  You will need a designated driver and someone to watch you at home on that day after the procedure.  COPD (chronic obstructive pulmonary disease) (HCC) PFT confirm obstruction. He is minimally symptomatic.  We reviewed your  pulmonary function testing. Keep albuterol available to use 2 puffs if needed for shortness of breath, chest tightness, wheezing. We will hold off on starting a scheduled inhaler medication for now.  We may consider this going forward depending on how your symptoms are doing.     Levy Pupa, MD, PhD 09/29/2023, 7:07 PM [facility] [phone] or if no answer before 7:00PM call [phone] For any issues after 7:00PM please call eLink [phone]

## 2023-10-03 ENCOUNTER — Other Ambulatory Visit: Payer: Self-pay | Admitting: Family Medicine

## 2023-10-03 ENCOUNTER — Ambulatory Visit (INDEPENDENT_AMBULATORY_CARE_PROVIDER_SITE_OTHER): Payer: Medicare HMO | Admitting: Family Medicine

## 2023-10-03 ENCOUNTER — Encounter: Payer: Self-pay | Admitting: Family Medicine

## 2023-10-03 VITALS — BP 147/67 | HR 67 | Temp 97.9°F | Ht 69.0 in | Wt 139.0 lb

## 2023-10-03 DIAGNOSIS — Z111 Encounter for screening for respiratory tuberculosis: Secondary | ICD-10-CM | POA: Diagnosis not present

## 2023-10-03 DIAGNOSIS — Z720 Tobacco use: Secondary | ICD-10-CM | POA: Diagnosis not present

## 2023-10-03 DIAGNOSIS — J449 Chronic obstructive pulmonary disease, unspecified: Secondary | ICD-10-CM

## 2023-10-03 DIAGNOSIS — J984 Other disorders of lung: Secondary | ICD-10-CM

## 2023-10-03 DIAGNOSIS — I10 Essential (primary) hypertension: Secondary | ICD-10-CM | POA: Diagnosis not present

## 2023-10-03 MED ORDER — AMLODIPINE BESYLATE 5 MG PO TABS: ORAL_TABLET | ORAL | 3 refills | Status: DC

## 2023-10-03 NOTE — Progress Notes (Signed)
Subjective:  Patient ID: Russell [name], male    DOB: 04-09-1951  Age: 73 y.o. MRN: [id number]  CC: No chief complaint on file.   HPI Russell [name] presents for  follow-up of hypertension. Patient has no history of headache chest pain or shortness of breath or recent cough. Patient also denies symptoms of TIA such as focal numbness or weakness. Patient denies side effects from medication. States taking it regularly.  Has cavitary lung lesion. Concerned for cancer. Ongoing work up with Dr. Delton Coombes. Bronchoscopy planned for 2 weeks from today. PET 2 weeks ago. Report reviewed.  History Russell [name] has a past medical history of adenomatous polyp of colon (03/24/2016), Hyperlipidemia, and Hypertension.   Russell [name] has a past surgical history that includes Hernia repair; Fracture surgery (Left, age 34); Wisdom tooth extraction; Colonoscopy (09/07/2003); Upper gi endoscopy; Upper gastrointestinal endoscopy; and cyst removal from gum.   His family history includes Diabetes in his mother; Heart disease in his mother; Hyperlipidemia in his father.Russell [name] reports that Russell [name] has been smoking cigarettes. Russell [name] has a 45 pack-year smoking history. Russell [name] has never used smokeless tobacco. Russell [name] reports current alcohol use of about 7.0 standard drinks of alcohol per week. Russell [name] reports that Russell [name] does not use drugs.  Current Outpatient Medications on File Prior to Visit  Medication Sig Dispense Refill   aspirin EC 81 MG tablet Take 81 mg by mouth every other day.      augmented betamethasone dipropionate (DIPROLENE-AF) 0.05 % cream Apply topically 2 (two) times daily. At affected areas (avoid face and genitals) 150 g 3   B Complex Vitamins (B-COMPLEX/B-12 PO) Take 1,000 mcg by mouth daily.     Cholecalciferol (VITAMIN D) 2000 units CAPS Take 2,000 Units by mouth daily.     olmesartan (BENICAR) 40 MG tablet Take 1 tablet (40 mg total) by mouth daily. For blood pressure 90 tablet 3   Omega-3 Fatty Acids (FISH OIL) 1000 MG CAPS Take 1,000 mg by  mouth.     Ascorbic Acid (VITAMIN C PO) Take by mouth.     No current facility-administered medications on file prior to visit.    ROS Review of Systems  Constitutional: Negative.   HENT: Negative.    Eyes:  Negative for visual disturbance.  Respiratory:  Negative for cough and shortness of breath.   Cardiovascular:  Negative for chest pain and leg swelling.  Gastrointestinal:  Negative for abdominal pain, diarrhea, nausea and vomiting.  Genitourinary:  Negative for difficulty urinating.  Musculoskeletal:  Negative for arthralgias and myalgias.  Skin:  Negative for rash.  Neurological:  Negative for headaches.  Psychiatric/Behavioral:  Negative for sleep disturbance.     Objective:  BP (!) 147/67   Pulse 67   Temp 97.9 F (36.6 C)   Ht 5\' 9"  (1.753 m)   Wt 139 lb (63 kg)   SpO2 100%   BMI 20.53 kg/m   BP Readings from Last 3 Encounters:  10/03/23 (!) 147/67  09/29/23 119/60  06/01/23 132/70    Wt Readings from Last 3 Encounters:  10/03/23 139 lb (63 kg)  09/29/23 139 lb (63 kg)  06/01/23 140 lb 9.6 oz (63.8 kg)     Physical Exam Constitutional:      General: Russell [name] is not in acute distress.    Appearance: Russell [name] is well-developed.  HENT:     Head: Normocephalic and atraumatic.     Right Ear: External ear normal.     Left Ear: External ear normal.  Nose: Nose normal.  Eyes:     Conjunctiva/sclera: Conjunctivae normal.     Pupils: Pupils are equal, round, and reactive to light.  Cardiovascular:     Rate and Rhythm: Normal rate and regular rhythm.     Heart sounds: Normal heart sounds. No murmur heard. Pulmonary:     Effort: Pulmonary effort is normal. No respiratory distress.     Breath sounds: Normal breath sounds. No wheezing or rales.  Abdominal:     Palpations: Abdomen is soft.     Tenderness: There is no abdominal tenderness.  Musculoskeletal:        General: Normal range of motion.     Cervical back: Normal range of motion and neck supple.  Skin:     General: Skin is warm and dry.  Neurological:     Mental Status: Russell [name] is alert and oriented to person, place, and time.     Deep Tendon Reflexes: Reflexes are normal and symmetric.  Psychiatric:        Behavior: Behavior normal.        Thought Content: Thought content normal.        Judgment: Judgment normal.       Assessment & Plan:   Diagnoses and all orders for this visit:  Cavitary lesion of lung -     QuantiFERON-TB Gold Plus  Primary hypertension -     CMP14+EGFR  Chronic obstructive pulmonary disease, unspecified COPD type (HCC) -     CMP14+EGFR  Tobacco use  Other orders -     amLODipine (NORVASC) 5 MG tablet; TAKE 1 TABLET EVERY DAY  FOR BLOOD PRESSURE   Allergies as of 10/03/2023   No Known Allergies      Medication List        Accurate as of October 03, 2023  2:41 PM. If you have any questions, ask your nurse or doctor.          amLODipine 5 MG tablet Commonly known as: NORVASC TAKE 1 TABLET EVERY DAY  FOR BLOOD PRESSURE   aspirin EC 81 MG tablet Take 81 mg by mouth every other day.   augmented betamethasone dipropionate 0.05 % cream Commonly known as: DIPROLENE-AF Apply topically 2 (two) times daily. At affected areas (avoid face and genitals)   B-COMPLEX/B-12 PO Take 1,000 mcg by mouth daily.   Fish Oil 1000 MG Caps Take 1,000 mg by mouth.   olmesartan 40 MG tablet Commonly known as: BENICAR Take 1 tablet (40 mg total) by mouth daily. For blood pressure   VITAMIN C PO Take by mouth.   Vitamin D 50 MCG (2000 UT) Caps Take 2,000 Units by mouth daily.        Meds ordered this encounter  Medications   amLODipine (NORVASC) 5 MG tablet    Sig: TAKE 1 TABLET EVERY DAY  FOR BLOOD PRESSURE    Dispense:  90 tablet    Refill:  3      Follow-up: Return in about 6 months (around 04/01/2024) for [facility] physical.  Mechele Claude, M.D.

## 2023-10-04 ENCOUNTER — Encounter: Payer: Self-pay | Admitting: Family Medicine

## 2023-10-04 LAB — CMP14+EGFR
ALT: 10 [IU]/L (ref 0–44)
AST: 16 [IU]/L (ref 0–40)
Albumin: 4.2 g/dL (ref 3.8–4.8)
Alkaline Phosphatase: 70 [IU]/L (ref 44–121)
BUN/Creatinine Ratio: 15 (ref 10–24)
BUN: 15 mg/dL (ref 8–27)
Bilirubin Total: 0.2 mg/dL (ref 0.0–1.2)
CO2: 23 mmol/L (ref 20–29)
Calcium: 9.2 mg/dL (ref 8.6–10.2)
Chloride: 105 mmol/L (ref 96–106)
Creatinine, Ser: 0.97 mg/dL (ref 0.76–1.27)
Globulin, Total: 2.2 g/dL (ref 1.5–4.5)
Glucose: 70 mg/dL (ref 70–99)
Potassium: 4.4 mmol/L (ref 3.5–5.2)
Sodium: 142 mmol/L (ref 134–144)
Total Protein: 6.4 g/dL (ref 6.0–8.5)
eGFR: 83 mL/min/{1.73_m2} (ref >59)

## 2023-10-07 LAB — QUANTIFERON-TB GOLD PLUS
QuantiFERON Mitogen Value: >10 [IU]/mL
QuantiFERON Nil Value: 0 [IU]/mL
QuantiFERON TB1 Ag Value: 0 [IU]/mL
QuantiFERON TB2 Ag Value: 0.01 [IU]/mL
QuantiFERON-TB Gold Plus: NEGATIVE

## 2023-10-13 DIAGNOSIS — L258 Unspecified contact dermatitis due to other agents: Secondary | ICD-10-CM | POA: Diagnosis not present

## 2023-10-14 ENCOUNTER — Encounter (HOSPITAL_COMMUNITY): Payer: Self-pay | Admitting: Emergency Medicine

## 2023-10-14 ENCOUNTER — Other Ambulatory Visit: Payer: Self-pay

## 2023-10-14 NOTE — Pre-Procedure Instructions (Signed)
 -------------    SDW INSTRUCTIONS given:  Your procedure is scheduled on 2/10.  Report to [facility] Main Entrance A at 08:15 A.M., and check in at the Admitting office.  Any questions or running late day of surgery: call [phone]    Remember:  Do not eat or drink after midnight the night before your surgery      Take these medicines the morning of surgery with A SIP OF WATER  Amlodipine   Hold Aspirin  2 days prior. Last dose 2/7  As of today, STOP taking any Aleve, Naproxen, Ibuprofen, Motrin, Advil, Goody's, BC's, all herbal medications, fish oil, and all vitamins.   Do NOT Smoke (Tobacco/Vaping) 24 hours prior to your procedure  If you use a CPAP at night, you may bring all equipment for your overnight stay.     You will be asked to remove any contacts, glasses, piercing's, hearing aid's, dentures/partials prior to surgery. Please bring cases for these items if needed.     Patients discharged the day of surgery will not be allowed to drive home, and someone needs to stay with them for 24 hours.  SURGICAL WAITING ROOM VISITATION Patients may have no more than 2 support people in the waiting area - these visitors may rotate.   Pre-op nurse will coordinate an appropriate time for 1 ADULT support person, who may not rotate, to accompany patient in pre-op.  Children under the age of 70 must have an adult with them who is not the patient and must remain in the main waiting area with an adult.  If the patient needs to stay at the hospital during part of their recovery, the visitor guidelines for inpatient rooms apply.  Please refer to the [facility] website for the visitor guidelines for any additional information.   Special instructions:   [place]- Preparing For Surgery   Please follow these instructions carefully.   Shower the NIGHT BEFORE SURGERY and the MORNING OF SURGERY with DIAL Soap.   Pat yourself dry with a CLEAN TOWEL.  Wear CLEAN PAJAMAS to bed  the night before surgery  Place CLEAN SHEETS on your bed the night of your first shower and DO NOT SLEEP WITH PETS.   Additional instructions for the day of surgery: DO NOT APPLY any lotions, deodorants, cologne, or perfumes.   Do not wear jewelry or makeup Do not wear nail polish, gel polish, artificial nails, or any other type of covering on natural nails (fingers and toes) Do not bring valuables to the hospital. [facility] is not responsible for valuables/personal belongings. Put on clean/comfortable clothes.  Please brush your teeth.  Ask your nurse before applying any prescription medications to the skin.

## 2023-10-14 NOTE — Progress Notes (Signed)
 PCP - Dr. Butler Der Cardiologist - denies  PPM/ICD - denies   Chest x-ray - pt thinks he had one many years ago, unsure EKG - DOS Stress Test - pt thinks he had one many years ago, in [place], [place] ECHO - denies Cardiac Cath - denies  CPAP - denies  DM- denies  Blood Thinner Instructions: n/a Aspirin  Instructions: Hold 2 days. Last dose 2/7  ERAS Protcol - No, NPO  COVID TEST- n/a  Anesthesia review: no  Patient verbally denies any shortness of breath, fever, cough and chest pain during phone call     Questions were answered. Patient verbalized understanding of instructions.

## 2023-10-16 NOTE — Anesthesia Preprocedure Evaluation (Addendum)
 Anesthesia Evaluation  Patient identified by MRN, date of birth, ID band Patient awake    Reviewed: Allergy  & Precautions, H&P , NPO status , Patient's Chart, lab work & pertinent test results  Airway Mallampati: III  TM Distance: >3 FB Neck ROM: Full    Dental no notable dental hx. (+) Teeth Intact, Dental Advisory Given   Pulmonary COPD, Current Smoker and Patient abstained from smoking.   Pulmonary exam normal breath sounds clear to auscultation       Cardiovascular Exercise Tolerance: Good hypertension, Pt. on medications  Rhythm:Regular Rate:Normal     Neuro/Psych negative neurological ROS  negative psych ROS   GI/Hepatic negative GI ROS, Neg liver ROS,,,  Endo/Other  negative endocrine ROS    Renal/GU negative Renal ROS  negative genitourinary   Musculoskeletal  (+) Arthritis , Osteoarthritis,    Abdominal   Peds  Hematology negative hematology ROS (+)   Anesthesia Other Findings   Reproductive/Obstetrics negative OB ROS                             Anesthesia Physical Anesthesia Plan  ASA: 3  Anesthesia Plan: General   Post-op Pain Management: Tylenol  PO (pre-op)*   Induction: Intravenous  PONV Risk Score and Plan: 2 and Ondansetron , Dexamethasone , Propofol  infusion and TIVA  Airway Management Planned: Oral ETT  Additional Equipment:   Intra-op Plan:   Post-operative Plan: Extubation in OR  Informed Consent: I have reviewed the patients History and Physical, chart, labs and discussed the procedure including the risks, benefits and alternatives for the proposed anesthesia with the patient or authorized representative who has indicated his/her understanding and acceptance.     Dental advisory given  Plan Discussed with: CRNA  Anesthesia Plan Comments:        Anesthesia Quick Evaluation

## 2023-10-17 ENCOUNTER — Ambulatory Visit (HOSPITAL_COMMUNITY): Payer: Medicare HMO

## 2023-10-17 ENCOUNTER — Observation Stay (HOSPITAL_COMMUNITY)
Admission: RE | Admit: 2023-10-17 | Discharge: 2023-10-18 | Disposition: A | Discharge status: Home / Self Care | Payer: Medicare HMO | Attending: Emergency Medicine | Admitting: Emergency Medicine

## 2023-10-17 ENCOUNTER — Inpatient Hospital Stay (HOSPITAL_COMMUNITY): Payer: Medicare HMO

## 2023-10-17 ENCOUNTER — Ambulatory Visit (HOSPITAL_BASED_OUTPATIENT_CLINIC_OR_DEPARTMENT_OTHER): Payer: Medicare HMO | Admitting: Anesthesiology

## 2023-10-17 ENCOUNTER — Encounter (HOSPITAL_COMMUNITY): Payer: Self-pay | Admitting: Emergency Medicine

## 2023-10-17 ENCOUNTER — Ambulatory Visit (HOSPITAL_COMMUNITY): Payer: Self-pay | Admitting: Anesthesiology

## 2023-10-17 ENCOUNTER — Other Ambulatory Visit: Payer: Self-pay

## 2023-10-17 ENCOUNTER — Encounter (HOSPITAL_COMMUNITY)
Admission: RE | Disposition: A | Discharge status: Home / Self Care | Payer: Self-pay | Source: Home / Self Care | Attending: Emergency Medicine

## 2023-10-17 DIAGNOSIS — J439 Emphysema, unspecified: Secondary | ICD-10-CM | POA: Diagnosis not present

## 2023-10-17 DIAGNOSIS — J449 Chronic obstructive pulmonary disease, unspecified: Secondary | ICD-10-CM

## 2023-10-17 DIAGNOSIS — Z7982 Long term (current) use of aspirin: Secondary | ICD-10-CM | POA: Diagnosis not present

## 2023-10-17 DIAGNOSIS — J939 Pneumothorax, unspecified: Secondary | ICD-10-CM | POA: Diagnosis present

## 2023-10-17 DIAGNOSIS — F17210 Nicotine dependence, cigarettes, uncomplicated: Secondary | ICD-10-CM | POA: Diagnosis not present

## 2023-10-17 DIAGNOSIS — I700 Atherosclerosis of aorta: Secondary | ICD-10-CM | POA: Diagnosis not present

## 2023-10-17 DIAGNOSIS — J984 Other disorders of lung: Secondary | ICD-10-CM | POA: Diagnosis not present

## 2023-10-17 DIAGNOSIS — I10 Essential (primary) hypertension: Secondary | ICD-10-CM | POA: Insufficient documentation

## 2023-10-17 DIAGNOSIS — R918 Other nonspecific abnormal finding of lung field: Secondary | ICD-10-CM | POA: Diagnosis not present

## 2023-10-17 DIAGNOSIS — J9383 Other pneumothorax: Secondary | ICD-10-CM | POA: Diagnosis not present

## 2023-10-17 DIAGNOSIS — R911 Solitary pulmonary nodule: Principal | ICD-10-CM | POA: Diagnosis present

## 2023-10-17 DIAGNOSIS — Z79899 Other long term (current) drug therapy: Secondary | ICD-10-CM | POA: Diagnosis not present

## 2023-10-17 DIAGNOSIS — J95811 Postprocedural pneumothorax: Secondary | ICD-10-CM | POA: Diagnosis present

## 2023-10-17 DIAGNOSIS — Z48813 Encounter for surgical aftercare following surgery on the respiratory system: Secondary | ICD-10-CM | POA: Diagnosis not present

## 2023-10-17 DIAGNOSIS — R846 Abnormal cytological findings in specimens from respiratory organs and thorax: Secondary | ICD-10-CM | POA: Diagnosis not present

## 2023-10-17 HISTORY — PX: BRONCHIAL WASHINGS: SHX5105

## 2023-10-17 HISTORY — DX: Postprocedural pneumothorax: J95.811

## 2023-10-17 HISTORY — PX: BRONCHIAL BIOPSY: SHX5109

## 2023-10-17 HISTORY — PX: BRONCHIAL BRUSHINGS: SHX5108

## 2023-10-17 HISTORY — PX: BRONCHIAL NEEDLE ASPIRATION BIOPSY: SHX5106

## 2023-10-17 LAB — CBC
HCT: 43.8 % (ref 39.0–52.0)
Hemoglobin: 14.8 g/dL (ref 13.0–17.0)
MCH: 32.7 pg (ref 26.0–34.0)
MCHC: 33.8 g/dL (ref 30.0–36.0)
MCV: 96.7 fL (ref 80.0–100.0)
Platelets: 226 10*3/uL (ref 150–400)
RBC: 4.53 MIL/uL (ref 4.22–5.81)
RDW: 13.8 % (ref 11.5–15.5)
WBC: 9.7 10*3/uL (ref 4.0–10.5)
nRBC: 0 % (ref 0.0–0.2)

## 2023-10-17 SURGERY — BRONCHOSCOPY, WITH BIOPSY USING ELECTROMAGNETIC NAVIGATION
Anesthesia: General | Laterality: Left

## 2023-10-17 MED ORDER — ROCURONIUM BROMIDE 10 MG/ML (PF) SYRINGE
PREFILLED_SYRINGE | INTRAVENOUS | Status: DC | PRN
  Administered 2023-10-17: 50 mg via INTRAVENOUS

## 2023-10-17 MED ORDER — ALBUTEROL SULFATE HFA 108 (90 BASE) MCG/ACT IN AERS
INHALATION_SPRAY | RESPIRATORY_TRACT | Status: DC | PRN
  Administered 2023-10-17: 8 via RESPIRATORY_TRACT

## 2023-10-17 MED ORDER — IRBESARTAN 300 MG PO TABS
300.0000 mg | ORAL_TABLET | Freq: Every day | ORAL | Status: DC
  Administered 2023-10-18: 300 mg via ORAL
  Filled 2023-10-17: qty 1

## 2023-10-17 MED ORDER — PROPOFOL 500 MG/50ML IV EMUL
INTRAVENOUS | Status: DC | PRN
  Administered 2023-10-17: 125 ug/kg/min via INTRAVENOUS

## 2023-10-17 MED ORDER — ASPIRIN 81 MG PO TBEC
81.0000 mg | DELAYED_RELEASE_TABLET | ORAL | Status: DC
  Administered 2023-10-18: 81 mg via ORAL
  Filled 2023-10-17: qty 1

## 2023-10-17 MED ORDER — AMLODIPINE BESYLATE 5 MG PO TABS
5.0000 mg | ORAL_TABLET | Freq: Every day | ORAL | Status: DC
  Administered 2023-10-18: 5 mg via ORAL
  Filled 2023-10-17: qty 1

## 2023-10-17 MED ORDER — TRAMADOL HCL 50 MG PO TABS: 50.0000 mg | ORAL_TABLET | Freq: Three times a day (TID) | ORAL | Status: DC | PRN

## 2023-10-17 MED ORDER — ONDANSETRON HCL 4 MG/2ML IJ SOLN
INTRAMUSCULAR | Status: DC | PRN
  Administered 2023-10-17: 4 mg via INTRAVENOUS

## 2023-10-17 MED ORDER — SODIUM CHLORIDE 0.9 % IV SOLN: 250.0000 mL | INTRAVENOUS | Status: AC | PRN

## 2023-10-17 MED ORDER — SUGAMMADEX SODIUM 200 MG/2ML IV SOLN
INTRAVENOUS | Status: DC | PRN
  Administered 2023-10-17: 200 mg via INTRAVENOUS

## 2023-10-17 MED ORDER — SODIUM CHLORIDE 0.9% FLUSH: 3.0000 mL | INTRAVENOUS | Status: DC | PRN

## 2023-10-17 MED ORDER — SODIUM CHLORIDE 0.9 % IV SOLN
INTRAVENOUS | Status: DC
Start: 2023-10-17 — End: 2023-10-17

## 2023-10-17 MED ORDER — PHENYLEPHRINE 80 MCG/ML (10ML) SYRINGE FOR IV PUSH (FOR BLOOD PRESSURE SUPPORT)
PREFILLED_SYRINGE | INTRAVENOUS | Status: DC | PRN
Start: 2023-10-17 — End: 2023-10-17
  Administered 2023-10-17 (×2): 160 ug via INTRAVENOUS

## 2023-10-17 MED ORDER — PHENYLEPHRINE HCL-NACL 20-0.9 MG/250ML-% IV SOLN
INTRAVENOUS | Status: DC | PRN
  Administered 2023-10-17: 30 ug/min via INTRAVENOUS

## 2023-10-17 MED ORDER — CHLORHEXIDINE GLUCONATE 0.12 % MT SOLN
OROMUCOSAL | Status: AC
  Administered 2023-10-17: 15 mL via OROMUCOSAL
  Filled 2023-10-17: qty 15

## 2023-10-17 MED ORDER — VITAMIN B-12 1000 MCG PO TABS
1000.0000 ug | ORAL_TABLET | Freq: Every morning | ORAL | Status: DC
  Administered 2023-10-18: 1000 ug via ORAL
  Filled 2023-10-17: qty 1

## 2023-10-17 MED ORDER — SODIUM CHLORIDE 0.9% FLUSH
3.0000 mL | Freq: Two times a day (BID) | INTRAVENOUS | Status: DC
  Administered 2023-10-17 – 2023-10-18 (×3): 3 mL via INTRAVENOUS

## 2023-10-17 MED ORDER — OXYCODONE HCL 5 MG PO TABS: 5.0000 mg | ORAL_TABLET | ORAL | Status: DC | PRN

## 2023-10-17 MED ORDER — CHLORHEXIDINE GLUCONATE 0.12 % MT SOLN: 15.0000 mL | Freq: Once | OROMUCOSAL | Status: AC

## 2023-10-17 MED ORDER — ACETAMINOPHEN 500 MG PO TABS
1000.0000 mg | ORAL_TABLET | Freq: Once | ORAL | Status: AC
  Administered 2023-10-17: 1000 mg via ORAL
  Filled 2023-10-17: qty 2

## 2023-10-17 MED ORDER — BETAMETHASONE DIPROPIONATE AUG 0.05 % EX CREA: 1.0000 | TOPICAL_CREAM | Freq: Two times a day (BID) | CUTANEOUS | Status: DC | PRN

## 2023-10-17 MED ORDER — LIDOCAINE 2% (20 MG/ML) 5 ML SYRINGE
INTRAMUSCULAR | Status: DC | PRN
  Administered 2023-10-17: 60 mg via INTRAVENOUS

## 2023-10-17 MED ORDER — HEPARIN SODIUM (PORCINE) 5000 UNIT/ML IJ SOLN
5000.0000 [IU] | Freq: Three times a day (TID) | INTRAMUSCULAR | Status: DC
  Filled 2023-10-17 (×2): qty 1

## 2023-10-17 MED ORDER — FENTANYL CITRATE (PF) 250 MCG/5ML IJ SOLN
INTRAMUSCULAR | Status: DC | PRN
  Administered 2023-10-17: 100 ug via INTRAVENOUS

## 2023-10-17 MED ORDER — FENTANYL CITRATE (PF) 100 MCG/2ML IJ SOLN: 25.0000 ug | INTRAMUSCULAR | Status: DC | PRN

## 2023-10-17 MED ORDER — PROPOFOL 10 MG/ML IV BOLUS
INTRAVENOUS | Status: DC | PRN
  Administered 2023-10-17: 30 mg via INTRAVENOUS
  Administered 2023-10-17: 150 mg via INTRAVENOUS

## 2023-10-17 NOTE — H&P (Signed)
 NAME:  Russell [name], MRN:  [id number], DOB:  May 10, 1951, LOS: 0 ADMISSION DATE:  10/17/2023, CONSULTATION DATE:  10/17/2023, CHIEF COMPLAINT:  Pneumothorax    History of Present Illness:  Russell [name] is a 73 y.o. male with a PMH significant for presumed COPD, cavitary left upper lobe nodule HTN, HLD, daily tobacco use who presented for elective video-assisted bronchoscopy with Dr. Delton Coombes 2/10 patient was seen with small pneumothorax postprocedure.  Patient was admitted for close monitoring of pneumothorax.   Pertinent  Medical History  Presumed COPD, cavitary left upper lobe nodule HTN, HLD, daily tobacco  Significant Hospital Events: Including procedures, antibiotic start and stop dates in addition to other pertinent events   2/10 pneumothorax post video-assisted bronchoscopy  Interim History / Subjective:  Seen sitting up in bed with complaints of left flank pain post procedure   Objective   Blood pressure 132/63, pulse 63, temperature 97.8 F (36.6 C), temperature source Temporal, resp. rate 15, height 5\' 10"  (1.778 m), weight 63.5 kg, SpO2 92%.        Intake/Output Summary (Last 24 hours) at 10/17/2023 1358 Last data filed at 10/17/2023 1206 Gross per 24 hour  Intake 550 ml  Output --  Net 550 ml   Filed Weights   10/14/23 1310 10/17/23 0827  Weight: 63.5 kg 63.5 kg    Examination: General: Well appearing elderly male lying in bed in no acute distress  HEENT: [place]/AT, MM pink/moist, PERRL,  Neuro: Alert and oriented x 3, nonfocal CV: s1s2 regular rate and rhythm, no murmur, rubs, or gallops,  PULM: Clear to auscultation bilaterally, no increased work of breathing, no added breath GI: soft, bowel sounds active in all 4 quadrants, non-tender, non-distended, tolerating oral diet Extremities: warm/dry, no edema  Skin: no rashes or lesions  Resolved Hospital Problem list     Assessment & Plan:  Pneumothorax post video-assisted bronchoscopy -Patient seen with small apical  pneumothorax postprocedure P: Decision made to monitor pneumothorax with repeat chest x-ray at 1400 If pneumothorax has progressed in size we will plan for placement of apical chest tube Continue supplemental oxygen  Cavitary left upper lobe nodule  Presumed COPD Daily tobacco use P: Follow-up pathology Nicotine patch as needed Cessation education  Essential HTN, HLD P: Continue home Norvasc, Benicar and aspirin  Best Practice (right click and "Reselect all SmartList Selections" daily)   Diet/type: Regular consistency (see orders) DVT prophylaxis SCD Pressure ulcer(s): N/A GI prophylaxis: N/A Lines: N/A Foley:  N/A Code Status:  full code Last date of multidisciplinary goals of care discussion: Updated patient and family daily  Labs   CBC: Recent Labs  Lab 10/17/23 0826  WBC 9.7  HGB 14.8  HCT 43.8  MCV 96.7  PLT 226    Basic Metabolic Panel: No results for input(s): "NA", "K", "CL", "CO2", "GLUCOSE", "BUN", "CREATININE", "CALCIUM", "MG", "PHOS" in the last 168 hours. GFR: Estimated Creatinine Clearance: 61.8 mL/min (by C-G formula based on SCr of 0.97 mg/dL). Recent Labs  Lab 10/17/23 0826  WBC 9.7    Liver Function Tests: No results for input(s): "AST", "ALT", "ALKPHOS", "BILITOT", "PROT", "ALBUMIN" in the last 168 hours. No results for input(s): "LIPASE", "AMYLASE" in the last 168 hours. No results for input(s): "AMMONIA" in the last 168 hours.  ABG No results found for: "PHART", "PCO2ART", "PO2ART", "HCO3", "TCO2", "ACIDBASEDEF", "O2SAT"   Coagulation Profile: No results for input(s): "INR", "PROTIME" in the last 168 hours.  Cardiac Enzymes: No results for input(s): "CKTOTAL", "CKMB", "CKMBINDEX", "  TROPONINI" in the last 168 hours.  HbA1C: HB A1C (BAYER DCA - WAIVED)  Date/Time Value Ref Range Status  05/19/2018 01:44 PM 5.5 <7.0 % Final    Comment:                                          Diabetic Adult            <7.0                                        Healthy Adult        4.3 - 5.7                                                           (DCCT/NGSP) American Diabetes Association's Summary of Glycemic Recommendations for Adults with Diabetes: Hemoglobin A1c <7.0%. More stringent glycemic goals (A1c <6.0%) may further reduce complications at the cost of increased risk of hypoglycemia.   02/20/2018 03:52 PM 5.5 <7.0 % Final    Comment:                                          Diabetic Adult            <7.0                                       Healthy Adult        4.3 - 5.7                                                           (DCCT/NGSP) American Diabetes Association's Summary of Glycemic Recommendations for Adults with Diabetes: Hemoglobin A1c <7.0%. More stringent glycemic goals (A1c <6.0%) may further reduce complications at the cost of increased risk of hypoglycemia.     CBG: No results for input(s): "GLUCAP" in the last 168 hours.  Review of Systems:   Please see the history of present illness. All other systems reviewed and are negative    Past Medical History:  He,  has a past medical history of adenomatous polyp of colon (03/24/2016), Hyperlipidemia, and Hypertension.   Surgical History:   Past Surgical History:  Procedure Laterality Date   COLONOSCOPY  09/07/2003   gessner - hx polyp   cyst removal from gum     FRACTURE SURGERY Left age 40   Left Knee   HERNIA REPAIR     UPPER GASTROINTESTINAL ENDOSCOPY     UPPER GI ENDOSCOPY     Normal   WISDOM TOOTH EXTRACTION       Social History:   reports that he has been smoking cigarettes. He has a 45 pack-year smoking history. He has never used smokeless tobacco. He reports current  alcohol use of about 7.0 standard drinks of alcohol per week. He reports that he does not use drugs.   Family History:  His family history includes Diabetes in his mother; Heart disease in his mother; Hyperlipidemia in his father. There is no history of Colon cancer,  Esophageal cancer, Rectal cancer, Stomach cancer, Pancreatic cancer, Prostate cancer, or Colon polyps.   Allergies No Known Allergies   Home Medications  Prior to Admission medications   Medication Sig Start Date End Date Taking? Authorizing Provider  amLODipine (NORVASC) 5 MG tablet TAKE 1 TABLET EVERY DAY FOR BLOOD PRESSURE 10/03/23  Yes Mechele Claude, MD  aspirin EC 81 MG tablet Take 81 mg by mouth every other day.    Yes [provider]  cyanocobalamin (VITAMIN B12) 1000 MCG tablet Take 1,000 mcg by mouth in the morning.   Yes [provider]  cycloSPORINE (RESTASIS) 0.05 % ophthalmic emulsion Place 1 drop into both eyes daily.   Yes [provider]  olmesartan (BENICAR) 40 MG tablet Take 1 tablet (40 mg total) by mouth daily. For blood pressure 03/31/23  Yes Stacks, Broadus John, MD  augmented betamethasone dipropionate (DIPROLENE-AF) 0.05 % cream Apply 1 Application topically 2 (two) times daily as needed (rash). At affected areas (avoid face and genitals) 10/17/23   Leslye Peer, MD     Critical care time: NA  Travis Purk D. Harris, NP-C [facility] Personal contact information can be found on Amion  If no contact or response made please call [phone] 10/17/2023, 3:38 PM

## 2023-10-17 NOTE — Anesthesia Procedure Notes (Addendum)
 Procedure Name: Intubation Date/Time: 10/17/2023 11:11 AM  Performed by: Katrinka Parr, CRNAPre-anesthesia Checklist: Patient identified, Emergency Drugs available, Suction available and Patient being monitored Patient Re-evaluated:Patient Re-evaluated prior to induction Oxygen Delivery Method: Circle System Utilized Preoxygenation: Pre-oxygenation with 100% oxygen Induction Type: IV induction Ventilation: Mask ventilation without difficulty Laryngoscope Size: Mac and 4 Grade View: Grade II Tube type: Oral Tube size: 8.5 mm Number of attempts: 1 Airway Equipment and Method: Stylet Placement Confirmation: ETT inserted through vocal cords under direct vision, positive ETCO2 and breath sounds checked- equal and bilateral Secured at: 23 cm Tube secured with: Tape Dental Injury: Teeth and Oropharynx as per pre-operative assessment

## 2023-10-17 NOTE — Anesthesia Postprocedure Evaluation (Signed)
 Anesthesia Post Note  Patient: Russell [name]  Procedure(s) Performed: ROBOTIC ASSISTED NAVIGATIONAL BRONCHOSCOPY (Left) BRONCHIAL NEEDLE ASPIRATION BIOPSIES BRONCHIAL BIOPSIES BRONCHIAL BRUSHINGS BRONCHIAL WASHINGS     Patient location during evaluation: PACU Anesthesia Type: General Level of consciousness: awake and alert Pain management: pain level controlled Vital Signs Assessment: post-procedure vital signs reviewed and stable Respiratory status: spontaneous breathing, nonlabored ventilation and respiratory function stable Cardiovascular status: blood pressure returned to baseline and stable Postop Assessment: no apparent nausea or vomiting Anesthetic complications: no  No notable events documented.  Last Vitals:  Vitals:   10/17/23 1300 10/17/23 1310  BP: 117/62 139/65  Pulse: 61 61  Resp: 15 14  Temp:    SpO2: 90% 93%    Last Pain:  Vitals:   10/17/23 1310  TempSrc:   PainSc: 0-No pain                 [name],W. EDMOND

## 2023-10-17 NOTE — Discharge Instructions (Addendum)
 Flexible Bronchoscopy, Care After This sheet gives you information about how to care for yourself after your test. Your doctor may also give you more specific instructions. If you have problems or questions, contact your doctor. Follow these instructions at home: Eating and drinking When your numbness is gone and your cough and gag reflexes have come back, you may: Eat only soft foods. Slowly drink liquids. When you get home after the test, go back to your normal diet. Driving Do not drive for 24 hours if you were given a medicine to help you relax (sedative). Do not drive or use heavy machinery while taking prescription pain medicine. General instructions  Take over-the-counter and prescription medicines only as told by your doctor. Return to your normal activities as told. Ask what activities are safe for you. Do not use any products that have nicotine or tobacco in them. This includes cigarettes and e-cigarettes. If you need help quitting, ask your doctor. Keep all follow-up visits as told by your doctor. This is important. It is very important if you had a tissue sample (biopsy) taken. Get help right away if: You have shortness of breath that gets worse. You get light-headed. You feel like you are going to pass out (faint). You have chest pain. You cough up: More than a little blood. More blood than before. Summary Do not eat or drink anything (not even water) for 2 hours after your test, or until your numbing medicine wears off. Do not use cigarettes. Do not use e-cigarettes. Get help right away if you have chest pain.  Please call our office for any questions or concerns.  [phone].  Okay to restart your aspirin  on 10/18/2023  This information is not intended to replace advice given to you by your health care provider. Make sure you discuss any questions you have with your health care provider. Document Released: 06/20/2009 Document Revised: 08/05/2017 Document Reviewed:  09/10/2016 Elsevier Patient Education  2020 [employer].

## 2023-10-17 NOTE — Op Note (Signed)
 Video Bronchoscopy with Robotic Assisted Bronchoscopic Navigation   Date of Operation: 10/17/2023   Pre-op Diagnosis: Left upper lobe cavitary lesion  Post-op Diagnosis: Same  Surgeon: Racheal Buddle  Assistants: None  Anesthesia: General endotracheal anesthesia  Operation: Flexible video fiberoptic bronchoscopy with robotic assistance and biopsies.  Estimated Blood Loss: Minimal  Complications: None  Indications and History: Russell [name] is a 73 y.o. male with history of tobacco use and COPD.  I been following him for a left upper lobe mixed density cavitary lesion that has evolved on CT scan of the chest.  The posterior superior lateral wall of the cavity has increased in thickness.  It was hypermetabolic on PET scan 09/09/2023.  Recommendation made to achieve a tissue diagnosis and obtain culture data via robotic assisted navigational bronchoscopy. The risks, benefits, complications, treatment options and expected outcomes were discussed with the patient.  The possibilities of pneumothorax, pneumonia, reaction to medication, pulmonary aspiration, perforation of a viscus, bleeding, failure to diagnose a condition and creating a complication requiring transfusion or operation were discussed with the patient who freely signed the consent.    Description of Procedure: The patient was seen in the Preoperative Area, was examined and was deemed appropriate to proceed.  The patient was taken to [facility] endoscopy room 3, identified as Russell [name] and the procedure verified as Flexible Video Fiberoptic Bronchoscopy.  A Time Out was held and the above information confirmed.   Prior to the date of the procedure a high-resolution CT scan of the chest was performed. Utilizing ION software program a virtual tracheobronchial tree was generated to allow the creation of distinct navigation pathways to the patient's parenchymal abnormalities. After being taken to the operating room general anesthesia was  initiated and the patient  was orally intubated. The video fiberoptic bronchoscope was introduced via the endotracheal tube and a general inspection was performed which showed normal right and left lung anatomy. Aspiration of the bilateral mainstems was completed to remove any remaining secretions. Robotic catheter inserted into patient's endotracheal tube.   Target #1 left upper lobe cavitary lesion: The distinct navigation pathways prepared prior to this procedure were then utilized to navigate to patient's lesion identified on CT scan.  Attention was made to target the posterior superior aspect of the thickened wall of the cavity.  The robotic catheter was secured into place and the vision probe was withdrawn.  Lesion location was approximated using fluoroscopy.  Local registration and targeting was performed using Cios three-dimensional imaging. Under fluoroscopic guidance transbronchial needle brushings, transbronchial needle biopsies, and transbronchial forceps biopsies were performed to be sent for cytology and pathology.  A single needle pass was obtained, placed in sterile saline and sent for culture.  Finally a bronchioalveolar lavage was performed in the left upper lobe adjacent to the cavitary lesion and sent for microbiology.  At the end of the procedure a general airway inspection was performed and there was no evidence of active bleeding. The bronchoscope was removed.  The patient tolerated the procedure well. There was no significant blood loss and there were no obvious complications. A post-procedural chest x-ray is pending.  Samples Target #1: 1. Transbronchial needle brushings from left upper lobe cavitary lesion 2. Transbronchial Wang needle biopsies from left upper lobe cavitary lesion 3. Transbronchial forceps biopsies from left upper lobe cavitary lesion 4. Bronchoalveolar lavage from left upper lobe   Plans:  The patient will be discharged from the PACU to home when recovered  from anesthesia and after  chest x-ray is reviewed. We will review the cytology, pathology and microbiology results with the patient when they become available. Outpatient followup will be with Dr. Baldwin Levee and Braden Caddy, NP.    Racheal Buddle, MD, PhD 10/17/2023, 12:03 PM [facility] [phone] or if no answer before 7:00PM call [phone] For any issues after 7:00PM please call eLink [phone]

## 2023-10-17 NOTE — Interval H&P Note (Signed)
 History and Physical Interval Note:  10/17/2023 10:29 AM  Russell [name]  has presented today for surgery, with the diagnosis of LEFT UPPER LOBE MASS.  The various methods of treatment have been discussed with the patient and family. After consideration of risks, benefits and other options for treatment, the patient has consented to  Procedure(s): ROBOTIC ASSISTED NAVIGATIONAL BRONCHOSCOPY (Left) as a surgical intervention.  The patient's history has been reviewed, patient examined, no change in status, stable for surgery.  I have reviewed the patient's chart and labs.  Questions were answered to the patient's satisfaction.     Denson Flake

## 2023-10-17 NOTE — Progress Notes (Signed)
 Brief PCCM Progress Note   Repeat CXR stable, no need for chest tube this evening. Plan for repeat CXR in AM  Russell [name] D. Harris, NP-C [facility] Personal contact information can be found on Amion  If no contact or response made please call [phone] 10/17/2023, 6:30 PM

## 2023-10-17 NOTE — Transfer of Care (Signed)
 Immediate Anesthesia Transfer of Care Note  Patient: Russell [name]  Procedure(s) Performed: ROBOTIC ASSISTED NAVIGATIONAL BRONCHOSCOPY (Left) BRONCHIAL NEEDLE ASPIRATION BIOPSIES BRONCHIAL BIOPSIES BRONCHIAL BRUSHINGS BRONCHIAL WASHINGS  Patient Location: Endoscopy Unit  Anesthesia Type:General  Level of Consciousness: awake, drowsy, and patient cooperative  Airway & Oxygen Therapy: Patient Spontanous Breathing  Post-op Assessment: Report given to RN and Post -op Vital signs reviewed and stable  Post vital signs: Reviewed and stable  Last Vitals:  Vitals Value Taken Time  BP 80/60 10/17/23 1211  Temp    Pulse 65 10/17/23 1213  Resp 13 10/17/23 1213  SpO2 90 % 10/17/23 1213  Vitals shown include unfiled device data.  Last Pain:  Vitals:   10/17/23 0834  PainSc: 0-No pain      Patients Stated Pain Goal: 0 (10/17/23 0834)  Complications: No notable events documented.

## 2023-10-18 ENCOUNTER — Telehealth: Payer: Self-pay | Admitting: Pulmonary Disease

## 2023-10-18 ENCOUNTER — Encounter (HOSPITAL_COMMUNITY): Payer: Self-pay | Admitting: Emergency Medicine

## 2023-10-18 ENCOUNTER — Observation Stay (HOSPITAL_COMMUNITY): Payer: Medicare HMO

## 2023-10-18 DIAGNOSIS — F17210 Nicotine dependence, cigarettes, uncomplicated: Secondary | ICD-10-CM | POA: Diagnosis not present

## 2023-10-18 DIAGNOSIS — J939 Pneumothorax, unspecified: Secondary | ICD-10-CM | POA: Diagnosis not present

## 2023-10-18 DIAGNOSIS — Z48813 Encounter for surgical aftercare following surgery on the respiratory system: Secondary | ICD-10-CM | POA: Diagnosis not present

## 2023-10-18 DIAGNOSIS — J9383 Other pneumothorax: Secondary | ICD-10-CM | POA: Diagnosis not present

## 2023-10-18 DIAGNOSIS — R911 Solitary pulmonary nodule: Secondary | ICD-10-CM | POA: Diagnosis not present

## 2023-10-18 DIAGNOSIS — Z7982 Long term (current) use of aspirin: Secondary | ICD-10-CM | POA: Diagnosis not present

## 2023-10-18 DIAGNOSIS — I10 Essential (primary) hypertension: Secondary | ICD-10-CM | POA: Diagnosis not present

## 2023-10-18 DIAGNOSIS — J95811 Postprocedural pneumothorax: Secondary | ICD-10-CM

## 2023-10-18 DIAGNOSIS — J984 Other disorders of lung: Secondary | ICD-10-CM | POA: Diagnosis not present

## 2023-10-18 DIAGNOSIS — J449 Chronic obstructive pulmonary disease, unspecified: Secondary | ICD-10-CM | POA: Diagnosis not present

## 2023-10-18 DIAGNOSIS — Z79899 Other long term (current) drug therapy: Secondary | ICD-10-CM | POA: Diagnosis not present

## 2023-10-18 LAB — CYTOLOGY - NON PAP

## 2023-10-18 MED ORDER — ALBUTEROL SULFATE HFA 108 (90 BASE) MCG/ACT IN AERS: 2.0000 | INHALATION_SPRAY | Freq: Four times a day (QID) | RESPIRATORY_TRACT | Status: DC | PRN

## 2023-10-18 NOTE — Care Management Obs Status (Signed)
MEDICARE OBSERVATION STATUS NOTIFICATION   Patient Details  Name: Russell [name] MRN: [id number] Date of Birth: 01-Aug-1951   Medicare Observation Status Notification Given:  Yes    Kingsley Plan, RN 10/18/2023, 7:06 AM

## 2023-10-18 NOTE — Progress Notes (Signed)
Discharge education provided before taking pt to the discharge lounge. Pt left the floor in stable condition

## 2023-10-18 NOTE — Progress Notes (Signed)
Pt ambulated in room on room air with lowest o2 sats reading of 96%. Highest recorded reading was 98%. No signs of shortness of breath observed.

## 2023-10-18 NOTE — Telephone Encounter (Signed)
I called and spoke with pts wife (DPR) Wife informed of Dr Lanora Manis note. Wife verbalized understanding. NFN

## 2023-10-18 NOTE — Discharge Summary (Signed)
Physician Discharge Summary         Patient ID: Russell [name] MRN: [id number] DOB/AGE: Nov 05, 1950 73 y.o.  Admit date: 10/17/2023 Discharge date: 10/18/2023  Discharge Diagnoses:    Active Hospital Problems   Diagnosis Date Noted   Pulmonary nodule 04/27/2023   Pneumothorax after biopsy 10/17/2023   Pneumothorax 10/17/2023    Resolved Hospital Problems  No resolved problems to display.    Discharge summary     73 y.o. male with a PMH significant for COPD, active tobacco abuse, cavitary left upper lobe nodule, HTN, and HLD who presented for elective video-assisted navigational bronchoscopy with Dr. Delton Coombes 2/10 for workup up of evolving left upper lobe cavitary lesion.  Course complicated by an iatrogenic left pneumothorax.  Patient was admitted for close monitoring of pneumothorax.  Serial imaging thus far showing stable left apical pneumothorax.  Pt denies any further complaints of left side chest pain or flank pain, SOB, and currently maintaining his saturations on room air.  Ambulated on room air without desaturation.    Discharge Plan by Active Problems    Left iatrogenic Pneumothorax post video-assisted bronchoscopy P: - pt remains asymptomatic, stable hemodynamics, on room air with normal ambulation with stable serial CXRs.  Discussed with Dr. Francine Graven.  Stable for discharge home.  - pt verbalizes instructions and awareness to seek emergent medical care, call EMS, and return to ER if any development of SOB, change/ recurrent cough, chest pain, or clinical decompensation. Avoid strenuous activity till f/u in office  - pt to have follow-up CXR next week prior to office visit 2/18   Cavitary left upper lobe nodule s/p ENAV bronchoscopy 2/10 COPD Daily tobacco use P: - has f/u appointment already set up with Kandice Robinsons, NP on 2/18 at 2pm.  Will set up to have CXR prior to appointment - Reports tiny amount of blood streaked sputum with cough this morning, expected post bronch.   Not on blood thinners  - Follow-up pathology - outpt pulmonary workup underway - has albuterol prn at home - Nicotine patch as needed - Cessation education    Essential HTN, HLD P: - Continue home Norvasc, Benicar and aspirin   Significant Hospital tests/ studies   Procedures   2/10- navigational bronch by Dr. Delton Coombes  Culture data/antimicrobials   2/10- AFB, cytology, BAL, culture, fungal   Consults     Discharge Exam: BP 124/68   Pulse 67   Temp 97.8 F (36.6 C) (Oral)   Resp 18   Ht 5\' 10"  (1.778 m)   Wt 63.5 kg   SpO2 93%   BMI 20.09 kg/m    General:  Thin older male sitting up in bedside recliner in NAD Neuro: Alert, oriented, appropriate, MAE CV: rr, no murmur PULM:  non labored, clear and diminished, faint diffuse exp wheeze GI: soft, bs+ Extremities: warm/dry, no LE edema  Skin: no rashes    Labs at discharge   Lab Results  Component Value Date   CREATININE 0.97 10/03/2023   BUN 15 10/03/2023   NA 142 10/03/2023   K 4.4 10/03/2023   CL 105 10/03/2023   CO2 23 10/03/2023   Lab Results  Component Value Date   WBC 9.7 10/17/2023   HGB 14.8 10/17/2023   HCT 43.8 10/17/2023   MCV 96.7 10/17/2023   PLT 226 10/17/2023   Lab Results  Component Value Date   ALT 10 10/03/2023   AST 16 10/03/2023   ALKPHOS 70 10/03/2023   BILITOT 0.2 10/03/2023  Lab Results  Component Value Date   INR 0.9 03/03/2022    Current radiological studies    Portable chest 1 View Result Date: 10/18/2023 CLINICAL DATA:  Status post left lung biopsy EXAM: PORTABLE CHEST 1 VIEW COMPARISON:  Film from the previous day. FINDINGS: Small left apical pneumothorax is again identified and stable. Persistent cavitary density in the left upper lobe laterally is seen. No sizable effusion is seen. No focal infiltrate is noted. IMPRESSION: Stable left apical pneumothorax when compared with the previous day. Electronically Signed   By: Alcide Clever M.D.   On: 10/18/2023 09:42    DG Chest Port 1 View Result Date: 10/17/2023 CLINICAL DATA:  Follow up pneumothorax EXAM: PORTABLE CHEST 1 VIEW COMPARISON:  Earlier 10/17/2023 FINDINGS: Left-sided pneumothorax is overall similar in size when adjusted for imaging technique and variance. No pleural effusion. No edema. Normal cardiopericardial silhouette. Subtle patchy left upper lung opacity. No right-sided consolidation. Hyperinflation. IMPRESSION: Stable left-sided pneumothorax. Recommend continued close follow-up and associated symptoms. Electronically Signed   By: Karen Kays M.D.   On: 10/17/2023 20:11   DG Chest Port 1 View Result Date: 10/17/2023 CLINICAL DATA:  Status post bronchoscopy EXAM: PORTABLE CHEST 1 VIEW COMPARISON:  CT 09/13/2023 FINDINGS: Thick-walled cavitary mass in the left upper lobe measuring 4.4 cm. Small moderate left pneumothorax estimated at about 20%, no midline shift. Stable cardiomediastinal silhouette. Aortic atherosclerosis IMPRESSION: 1. Small to moderate left pneumothorax as above.  No midline shift. 2. Thick-walled cavitary mass in the left upper lobe.  Emphysema Critical Value/emergent results were called by telephone at the time of interpretation on 10/17/2023 at 3:51 pm to provider Levy Pupa , who verbally acknowledged these results. Electronically Signed   By: Jasmine Pang M.D.   On: 10/17/2023 15:51   DG C-ARM BRONCHOSCOPY Result Date: 10/17/2023 C-ARM BRONCHOSCOPY: Fluoroscopy was utilized by the requesting physician.  No radiographic interpretation.    Disposition:    Discharge disposition: 01-Home or Self Care       Discharge Instructions     Discharge patient   Complete by: As directed    After chest x-ray has been reviewed   Discharge disposition: 01-Home or Self Care   Discharge patient date: 10/17/2023   Increase activity slowly   Complete by: As directed    No heavy lifting, > 10lbs till seen in office       Allergies as of 10/18/2023   No Known Allergies       Medication List     TAKE these medications    albuterol 108 (90 Base) MCG/ACT inhaler Commonly known as: VENTOLIN HFA Inhale 2 puffs into the lungs every 6 (six) hours as needed for wheezing or shortness of breath.   amLODipine 5 MG tablet Commonly known as: NORVASC TAKE 1 TABLET EVERY DAY FOR BLOOD PRESSURE   aspirin EC 81 MG tablet Take 81 mg by mouth every other day.   augmented betamethasone dipropionate 0.05 % cream Commonly known as: DIPROLENE-AF Apply 1 Application topically 2 (two) times daily as needed (rash). At affected areas (avoid face and genitals)   cyanocobalamin 1000 MCG tablet Commonly known as: VITAMIN B12 Take 1,000 mcg by mouth in the morning.   cycloSPORINE 0.05 % ophthalmic emulsion Commonly known as: RESTASIS Place 1 drop into both eyes daily.   olmesartan 40 MG tablet Commonly known as: BENICAR Take 1 tablet (40 mg total) by mouth daily. For blood pressure         Follow-up appointment  2/18 with Kandice Robinsons, NP with [facility] at 1400  Discharge Condition:    stable   Time spent in discharge 38 mins   Posey Boyer, MSN, AG-ACNP-BC [facility] 10/18/2023, 1:30 PM  See Amion for pager If no response to pager , please call [phone] until 7pm After 7:00 pm call Elink  [phone]

## 2023-10-18 NOTE — Telephone Encounter (Signed)
Patient needs chest x-ray for pneumothorax prior to upcoming visit on 2/18 with Sarah.  Order has been placed,  Thanks, JD

## 2023-10-18 NOTE — Care Management CC44 (Signed)
Condition Code 44 Documentation Completed  Patient Details  Name: CADARIUS NEVARES MRN: [id number] Date of Birth: 06-13-1951   Condition Code 44 given:  Yes Patient signature on Condition Code 44 notice:  Yes Documentation of 2 MD's agreement:  Yes Code 44 added to claim:  Yes    Kingsley Plan, RN 10/18/2023, 7:06 AM

## 2023-10-19 LAB — FUNGUS STAIN

## 2023-10-19 LAB — CULTURE, BAL-QUANTITATIVE W GRAM STAIN: Gram Stain: NONE SEEN

## 2023-10-19 LAB — ACID FAST SMEAR (AFB, MYCOBACTERIA)
Acid Fast Smear: NEGATIVE
Acid Fast Smear: NEGATIVE

## 2023-10-19 LAB — FUNGAL STAIN REFLEX

## 2023-10-21 ENCOUNTER — Ambulatory Visit: Payer: Medicare HMO

## 2023-10-21 DIAGNOSIS — J95811 Postprocedural pneumothorax: Secondary | ICD-10-CM

## 2023-10-21 DIAGNOSIS — Z48813 Encounter for surgical aftercare following surgery on the respiratory system: Secondary | ICD-10-CM | POA: Diagnosis not present

## 2023-10-21 DIAGNOSIS — R918 Other nonspecific abnormal finding of lung field: Secondary | ICD-10-CM | POA: Diagnosis not present

## 2023-10-21 DIAGNOSIS — J939 Pneumothorax, unspecified: Secondary | ICD-10-CM | POA: Diagnosis not present

## 2023-10-22 LAB — AEROBIC/ANAEROBIC CULTURE W GRAM STAIN (SURGICAL/DEEP WOUND): Culture: NO GROWTH

## 2023-10-25 ENCOUNTER — Ambulatory Visit: Payer: Medicare HMO | Admitting: Acute Care

## 2023-10-25 ENCOUNTER — Other Ambulatory Visit: Payer: Self-pay

## 2023-10-25 ENCOUNTER — Telehealth: Payer: Self-pay | Admitting: Acute Care

## 2023-10-25 ENCOUNTER — Other Ambulatory Visit: Payer: Self-pay | Admitting: Acute Care

## 2023-10-25 ENCOUNTER — Encounter: Payer: Self-pay | Admitting: Acute Care

## 2023-10-25 ENCOUNTER — Ambulatory Visit (INDEPENDENT_AMBULATORY_CARE_PROVIDER_SITE_OTHER): Payer: Medicare HMO

## 2023-10-25 VITALS — BP 128/67 | HR 60 | Ht 70.0 in | Wt 140.0 lb

## 2023-10-25 DIAGNOSIS — F17200 Nicotine dependence, unspecified, uncomplicated: Secondary | ICD-10-CM

## 2023-10-25 DIAGNOSIS — J95811 Postprocedural pneumothorax: Secondary | ICD-10-CM | POA: Diagnosis not present

## 2023-10-25 DIAGNOSIS — J439 Emphysema, unspecified: Secondary | ICD-10-CM | POA: Diagnosis not present

## 2023-10-25 DIAGNOSIS — J939 Pneumothorax, unspecified: Secondary | ICD-10-CM | POA: Diagnosis not present

## 2023-10-25 DIAGNOSIS — R911 Solitary pulmonary nodule: Secondary | ICD-10-CM

## 2023-10-25 DIAGNOSIS — Z48813 Encounter for surgical aftercare following surgery on the respiratory system: Secondary | ICD-10-CM | POA: Diagnosis not present

## 2023-10-25 DIAGNOSIS — I700 Atherosclerosis of aorta: Secondary | ICD-10-CM | POA: Diagnosis not present

## 2023-10-25 DIAGNOSIS — Z9889 Other specified postprocedural states: Secondary | ICD-10-CM

## 2023-10-25 NOTE — Progress Notes (Signed)
 History of Present Illness Russell [name] is a 73 y.o. male active smoker (45 pack years) with a history of hypertension, colon adenomatous polyp, hyperlipidemia.  Patient is followed by Dr. Delton Coombes for serial monitoring of a left upper lobe cavitary lesion.  Synopsis Pt. participates in lung cancer screening program, and Dr. Delton Coombes has been following a abnormal imaging since 12/2022 with serial imaging. There has been progression of a cavitary lesion in the left upper lobe with interval increase in the soft tissue along the lateral posterior aspect on the scan done September 13, 2023.  Because of this interval progression PET scan was done September 09, 2023. The PET scan showed the 3 point centimeter cavitary mass in the left upper lobe with a thin medial wall but a thick lateral wall had an SUV along the lateral wall of 14.4 previously 3.5.  Possibilities include cavitary malignancy and an active granulomatous process such as a fungal disease.  There were no findings of metastatic disease Because of these findings Dr. Delton Coombes opted for bronchoscopy with biopsy on October 17, 2023.  After bronchoscopy patient developed a small left apical pneumothorax that required an overnight stay in the hospital.  The pneumothorax was monitored but chest tube was never necessary as patient maintained oxygen saturations.  By October 18, 2023 pneumothorax had decreased in size and patient was discharged home.  Follow-up chest x-ray was done October 21, 2023 which showed continued resolution of the pneumothorax however it was not completely resolved. Patient was scheduled for follow-up in the office today for another chest x-ray to evaluate for resolution of small left apical pneumothorax.  Additionally patient is here for follow-up to review results of his cytology.   10/25/2023 Pt. Presents for follow up after bronchoscopy with biopsies. He states he has been doing well since discharge on 10/18/2023. He denies any bleeding,  fever, purulent secretions, or adverse reaction to anesthesia.  He denies any significant dyspnea.  We have reviewed the chest x-ray done today, which shows a small residual left pneumothorax which has decreased in size since it was last evaluated on 10/21/2023.  Patient states he does not have any shortness of breath or pain in the left side of his chest.  He feels he is back to his baseline. At [facility] we will check another chest x-ray in 1 week to reevaluate for complete resolution of left apical pneumothorax.  Patient is in agreement with this plan.  We have reviewed the patient's cytology.  Cytology in the left upper lobe mass shows large atypical cells with a background of benign reactive bronchial cells, pulmonary macrophages, and abundant inflammation. There is also notation of fibrosis and organizing pneumonia.  Cultures today that were done during the procedure are negative. Asked the patient about a history of any autoimmune disease in his family.  He does have a daughter who was diagnosed with juvenile arthritis at the age of 73. I will check with Dr. Delton Coombes about doing an autoimmune panel to rule out that as a potential etiology.  Test Results: Cytology 10/17/2023 A.  LEFT LUNG, UPPER LOBE, MASS, FINE NEEDLE ASPIRATION:  - Atypical  - Atypical large cells  - Background benign reactive bronchial cells, pulmonary macrophages and  abundant inflammation  - Fibrosis and organizing pneumonia (cellblock)   C.  LEFT LUNG, UPPER LOBE, MASS, BRUSHING:  - Atypical  - Rare atypical large cells   Micro 10/17/2023 Negative to date  for fungus Negative to date  for AFB  Negative to date for growth aerobically or anaerobically BAL No growth to date      Latest Ref Rng & Units 10/17/2023    8:26 AM 03/31/2023   10:00 AM 03/03/2022   10:27 AM  CBC  WBC 4.0 - 10.5 K/uL 9.7  9.5  8.9   Hemoglobin 13.0 - 17.0 g/dL 34.7  42.5  95.6   Hematocrit 39.0 - 52.0 % 43.8  47.3  48.1   Platelets  150 - 400 K/uL 226  240  234        Latest Ref Rng & Units 10/03/2023    3:07 PM 03/31/2023   10:00 AM 08/26/2022    4:31 PM  BMP  Glucose 70 - 99 mg/dL 70  93  75   BUN 8 - 27 mg/dL 15  14  15    Creatinine 0.76 - 1.27 mg/dL 3.87  5.64  3.32   BUN/Creat Ratio 10 - 24 15  14  16    Sodium 134 - 144 mmol/L 142  139  144   Potassium 3.5 - 5.2 mmol/L 4.4  5.1  4.6   Chloride 96 - 106 mmol/L 105  103  106   CO2 20 - 29 mmol/L 23  24  25    Calcium 8.6 - 10.2 mg/dL 9.2  9.5  9.1     BNP No results found for: "BNP"  ProBNP No results found for: "PROBNP"  PFT    Component Value Date/Time   FEV1PRE 1.45 09/29/2023 1416   FEV1POST 1.92 09/29/2023 1416   FVCPRE 2.72 09/29/2023 1416   FVCPOST 3.22 09/29/2023 1416   TLC 7.43 09/29/2023 1416   DLCOUNC 17.43 09/29/2023 1416   PREFEV1FVCRT 53 09/29/2023 1416   PSTFEV1FVCRT 60 09/29/2023 1416    DG Chest 2 View Result Date: 10/25/2023 CLINICAL DATA:  Post bronchoscopy. EXAM: CHEST - 2 VIEW COMPARISON:  Radiograph 10/21/2023 FINDINGS: Left pneumothorax has diminished in size, small residual paralleling the posterior third rib. Pneumothorax may be partially loculated seen anteriorly on the lateral view. Unchanged appearance of cavitary process in the left upper lung zone. Similar blunting of both costophrenic angles. Normal heart size with stable mediastinal contours. Aortic atherosclerosis. Emphysema with chronic bronchial thickening. IMPRESSION: 1. Small residual left pneumothorax, decreased in size from prior. 2. Stable appearance of cavitary process in the left lung. Electronically Signed   By: Narda Rutherford M.D.   On: 10/25/2023 15:54   DG Chest 2 View Result Date: 10/25/2023 CLINICAL DATA:  Post bronchoscopy.  Follow up pneumothorax. EXAM: CHEST - 2 VIEW COMPARISON:  Radiographs 10/18/2023 and 10/17/2023.  CT 09/13/2023. FINDINGS: Persistent left apical pneumothorax, slightly improved from most recent prior radiographs. The cavitary  lesion peripherally in the left upper lobe appears unchanged. Mildly increased opacity medially at the left lung base, likely atelectasis. Probable small left pleural effusion. The right lung is clear. The heart size and mediastinal contours are stable. IMPRESSION: 1. Persistent left apical pneumothorax, slightly improved from most recent prior radiographs. 2. Stable cavitary lesion peripherally in the left upper lobe. 3. Mildly increased opacity medially at the left lung base, likely atelectasis. Electronically Signed   By: Carey Bullocks M.D.   On: 10/25/2023 13:33   Portable chest 1 View Result Date: 10/18/2023 CLINICAL DATA:  Status post left lung biopsy EXAM: PORTABLE CHEST 1 VIEW COMPARISON:  Film from the previous day. FINDINGS: Small left apical pneumothorax is again identified and stable. Persistent cavitary density in the left upper lobe laterally is seen. No  sizable effusion is seen. No focal infiltrate is noted. IMPRESSION: Stable left apical pneumothorax when compared with the previous day. Electronically Signed   By: Alcide Clever M.D.   On: 10/18/2023 09:42   DG Chest Port 1 View Result Date: 10/17/2023 CLINICAL DATA:  Follow up pneumothorax EXAM: PORTABLE CHEST 1 VIEW COMPARISON:  Earlier 10/17/2023 FINDINGS: Left-sided pneumothorax is overall similar in size when adjusted for imaging technique and variance. No pleural effusion. No edema. Normal cardiopericardial silhouette. Subtle patchy left upper lung opacity. No right-sided consolidation. Hyperinflation. IMPRESSION: Stable left-sided pneumothorax. Recommend continued close follow-up and associated symptoms. Electronically Signed   By: Karen Kays M.D.   On: 10/17/2023 20:11   DG Chest Port 1 View Result Date: 10/17/2023 CLINICAL DATA:  Status post bronchoscopy EXAM: PORTABLE CHEST 1 VIEW COMPARISON:  CT 09/13/2023 FINDINGS: Thick-walled cavitary mass in the left upper lobe measuring 4.4 cm. Small moderate left pneumothorax estimated at  about 20%, no midline shift. Stable cardiomediastinal silhouette. Aortic atherosclerosis IMPRESSION: 1. Small to moderate left pneumothorax as above.  No midline shift. 2. Thick-walled cavitary mass in the left upper lobe.  Emphysema Critical Value/emergent results were called by telephone at the time of interpretation on 10/17/2023 at 3:51 pm to provider Levy Pupa , who verbally acknowledged these results. Electronically Signed   By: Jasmine Pang M.D.   On: 10/17/2023 15:51   DG C-ARM BRONCHOSCOPY Result Date: 10/17/2023 C-ARM BRONCHOSCOPY: Fluoroscopy was utilized by the requesting physician.  No radiographic interpretation.     Past medical hx Past Medical History:  Diagnosis Date   Hx of adenomatous polyp of colon 03/24/2016   Hyperlipidemia    borderline- no meds- diet controlled   Hypertension      Social History   Tobacco Use   Smoking status: Some Days    Current packs/day: 1.00    Average packs/day: 1 pack/day for 45.0 years (45.0 ttl pk-yrs)    Types: Cigarettes   Smokeless tobacco: Never  Vaping Use   Vaping status: Never Used  Substance Use Topics   Alcohol use: Yes    Alcohol/week: 7.0 standard drinks of alcohol    Types: 7 Shots of liquor per week   Drug use: Never    Mr.Atallah reports that he has been smoking cigarettes. He has a 45 pack-year smoking history. He has never used smokeless tobacco. He reports current alcohol use of about 7.0 standard drinks of alcohol per week. He reports that he does not use drugs.  Tobacco Cessation: Ready to quit: Not Answered Counseling given: Not Answered Current everyday smoker however patient states he is working on cutting back and quitting  Past surgical hx, Family hx, Social hx all reviewed.  Current Outpatient Medications on File Prior to Visit  Medication Sig   amLODipine (NORVASC) 5 MG tablet TAKE 1 TABLET EVERY DAY FOR BLOOD PRESSURE   aspirin EC 81 MG tablet Take 81 mg by mouth every other day.    augmented  betamethasone dipropionate (DIPROLENE-AF) 0.05 % cream Apply 1 Application topically 2 (two) times daily as needed (rash). At affected areas (avoid face and genitals)   cyanocobalamin (VITAMIN B12) 1000 MCG tablet Take 1,000 mcg by mouth in the morning.   cycloSPORINE (RESTASIS) 0.05 % ophthalmic emulsion Place 1 drop into both eyes daily.   olmesartan (BENICAR) 40 MG tablet Take 1 tablet (40 mg total) by mouth daily. For blood pressure   albuterol (VENTOLIN HFA) 108 (90 Base) MCG/ACT inhaler Inhale 2 puffs into the lungs  every 6 (six) hours as needed for wheezing or shortness of breath. (Patient not taking: Reported on 10/25/2023)   No current facility-administered medications on file prior to visit.     No Known Allergies  Review Of Systems:  Constitutional:   No  weight loss, night sweats,  Fevers, chills, fatigue, or  lassitude.  HEENT:   No headaches,  Difficulty swallowing,  Tooth/dental problems, or  Sore throat,                No sneezing, itching, ear ache, nasal congestion, post nasal drip,   CV:  No chest pain,  Orthopnea, PND, swelling in lower extremities, anasarca, dizziness, palpitations, syncope.   GI  No heartburn, indigestion, abdominal pain, nausea, vomiting, diarrhea, change in bowel habits, loss of appetite, bloody stools.   Resp: No shortness of breath with exertion or at rest.  No excess mucus, no productive cough,  No non-productive cough,  No coughing up of blood.  No change in color of mucus.  No wheezing.  No chest wall deformity  Skin: no rash or lesions.  GU: no dysuria, change in color of urine, no urgency or frequency.  No flank pain, no hematuria   MS:  No joint pain or swelling.  No decreased range of motion.  No back pain.  Psych:  No change in mood or affect. No depression or anxiety.  No memory loss.   Vital Signs BP 128/67 (BP Location: Left Arm, Patient Position: Sitting, Cuff Size: Large)   Pulse 60   Ht 5\' 10"  (1.778 m)   Wt 140 lb (63.5 kg)    SpO2 96%   BMI 20.09 kg/m    Physical Exam:  General- No distress,  A&Ox3, pleasant ENT: No sinus tenderness, TM clear, pale nasal mucosa, no oral exudate,no post nasal drip, no LAN Cardiac: S1, S2, regular rate and rhythm, no murmur Chest: No wheeze/ rales/ dullness; no accessory muscle use, no nasal flaring, no sternal retractions Abd.: Soft Non-tender, nondistended, bowel sounds positive,Body mass index is 20.09 kg/m.  Ext: No clubbing cyanosis, edema, no obvious deformities Neuro:  normal strength, moving all extremities x 4 alert and oriented x 3, appropriate Skin: No rashes, warm and dry, no obvious lesions Psych: normal mood and behavior   Assessment/Plan Left upper lobe lung mass with progression over time with serial imaging Current everyday smoker Post bronchoscopy with biopsies Small left apical pneumothorax post bronchoscopy Chest x-ray confirming improvement and near resolution of left apical pneumothorax Biopsies consistent with large atypical cells, Background benign reactive bronchial cells, pulmonary macrophages and  abundant inflammation  - Fibrosis and organizing pneumonia (cellblock)  Plan Your biopsies are + for atypical cells , with inflammation, some fibrosis and organizing pneumonia. No malignant cells Your cultures were all negative to date . We will do a 3 month follow up CT Chest to re-evaluate the nodule.  This will be due mid April 2025. You will get a call to get this scheduled closer to the time.  I will discuss the results with Dr. Delton Coombes.  If he feels we should add additional treatment options we will call you. We can consider autoimmune labs at follow up if the nodule progresses. I will call you with the results of the CXR that was done today. The pneumothorax is getting smaller with each CXR, which is good news. We will follow with CXR until it has resolved completely. CXR next Tuesday 2/25 here in the office  I will call you with  the  results Continue working on quitting smoking. You can receive free nicotine replacement therapy (patches, gum, or mints) by calling 1-800-QUIT NOW. Please call so we can get you on the path to becoming a non-smoker. I know it is hard, but you can do this!  Hypnosis for smoking cessation  [employer] [phone]  Acupuncture for smoking cessation  [employer] [phone]    I spent 30 minutes dedicated to the care of this patient on the date of this encounter to include pre-visit review of records, face-to-face time with the patient discussing conditions above, post visit ordering of testing, clinical documentation with the electronic health record, making appropriate referrals as documented, and communicating necessary information to the patient's healthcare team.     Bevelyn Ngo, NP 10/25/2023  8:21 PM

## 2023-10-25 NOTE — Patient Instructions (Addendum)
 It is good to see you today. Your biopsies are + for atypical cells , with inflammation, fibrosis and organizing pneumonia. You cultures were all negative . We will do a 3 month follow up CT Chest to re-evaluate the nodule.  This will be due mid April 2025. You will get a call to get this scheduled closer to the time.  I will discuss the results with Dr. Delton Coombes.  If he feels we should add additional treatment options we will call you. I will call you with the results of the CXR that was done today. The pneumothorax is getting smaller with each CXR, which is good news. We will follow with CXR until it has resolved completely. Follow up CXR 11/01/2023 here in the office I will call you with the results Continue working on quitting smoking. You can receive free nicotine replacement therapy (patches, gum, or mints) by calling 1-800-QUIT NOW. Please call so we can get you on the path to becoming a non-smoker. I know it is hard, but you can do this!  Hypnosis for smoking cessation  [employer] [phone]  Acupuncture for smoking cessation  [employer] [phone]

## 2023-10-26 NOTE — Telephone Encounter (Signed)
 Per Kandice Robinsons NP - pt will need CXR 2/25 to assure resolution of left pneumothorax. Will await results to give to Sarah. CXR was ordered as STAT.

## 2023-11-01 ENCOUNTER — Ambulatory Visit (INDEPENDENT_AMBULATORY_CARE_PROVIDER_SITE_OTHER): Payer: Medicare HMO

## 2023-11-01 DIAGNOSIS — J95811 Postprocedural pneumothorax: Secondary | ICD-10-CM | POA: Diagnosis not present

## 2023-11-01 DIAGNOSIS — R918 Other nonspecific abnormal finding of lung field: Secondary | ICD-10-CM | POA: Diagnosis not present

## 2023-11-01 DIAGNOSIS — J939 Pneumothorax, unspecified: Secondary | ICD-10-CM | POA: Diagnosis not present

## 2023-11-01 DIAGNOSIS — I700 Atherosclerosis of aorta: Secondary | ICD-10-CM | POA: Diagnosis not present

## 2023-11-02 NOTE — Telephone Encounter (Signed)
 CXR impression is below:   IMPRESSION: 1. Interval decrease in size of a possible persistent trace left apical and lateral pneumothorax. 2. Similar-appearing left upper lobe approximally 5 cm airspace opacity with cavitation. Finding could be infectious or malignant in etiology. 3.  Aortic Atherosclerosis (ICD10-I70.0).     Electronically Signed   By: Tish Frederickson M.D.   On: 11/01/2023 11:14

## 2023-11-15 LAB — FUNGUS CULTURE WITH STAIN

## 2023-11-15 LAB — FUNGUS CULTURE RESULT

## 2023-11-15 LAB — FUNGAL ORGANISM REFLEX

## 2023-11-16 LAB — FUNGUS CULTURE RESULT

## 2023-11-16 LAB — FUNGUS CULTURE WITH STAIN

## 2023-11-16 LAB — FUNGAL ORGANISM REFLEX

## 2023-11-30 LAB — ACID FAST CULTURE WITH REFLEXED SENSITIVITIES (MYCOBACTERIA)
Acid Fast Culture: NEGATIVE
Acid Fast Culture: NEGATIVE

## 2023-12-26 ENCOUNTER — Ambulatory Visit (HOSPITAL_BASED_OUTPATIENT_CLINIC_OR_DEPARTMENT_OTHER)
Admission: RE | Admit: 2023-12-26 | Discharge: 2023-12-26 | Disposition: A | Discharge status: Home / Self Care | Source: Ambulatory Visit | Attending: Acute Care | Admitting: Acute Care

## 2023-12-26 DIAGNOSIS — R918 Other nonspecific abnormal finding of lung field: Secondary | ICD-10-CM | POA: Diagnosis not present

## 2023-12-26 DIAGNOSIS — R911 Solitary pulmonary nodule: Secondary | ICD-10-CM | POA: Diagnosis not present

## 2023-12-26 DIAGNOSIS — I700 Atherosclerosis of aorta: Secondary | ICD-10-CM | POA: Diagnosis not present

## 2023-12-27 ENCOUNTER — Telehealth: Payer: Self-pay

## 2023-12-27 NOTE — Telephone Encounter (Signed)
 Copied from CRM [id number]. Topic: Appointments - Scheduling Inquiry for Clinic >> Dec 26, 2023 12:38 PM Russell [name] wrote: Reason for CRM: Patient had a CT scan done this morning and need a f/u appt in one week. Next available appt is 02/23/24 with Russell [name] and Russell [name], Russell [name] 03/13/24. Patient wants to be seen to check on his lung soon, does not want wait. Please advise and call back [phone].  Spoke with patient regarding prior message . Made a CT f/u with Russell [name] on 01/04/2024 at 3:00pm . Asked patient if he was having any other issues patient stated yes but would tell me what other issues and patient will discuss that with Russell [name] on the day of office visit.  Patient's voice was understanding.Nothing else further needed.

## 2024-01-04 ENCOUNTER — Ambulatory Visit: Admitting: Emergency Medicine

## 2024-01-04 ENCOUNTER — Encounter: Payer: Self-pay | Admitting: Emergency Medicine

## 2024-01-04 VITALS — BP 110/66 | HR 58 | Temp 97.5°F | Ht 70.0 in | Wt 138.8 lb

## 2024-01-04 DIAGNOSIS — R911 Solitary pulmonary nodule: Secondary | ICD-10-CM | POA: Diagnosis not present

## 2024-01-04 DIAGNOSIS — I2510 Atherosclerotic heart disease of native coronary artery without angina pectoris: Secondary | ICD-10-CM

## 2024-01-04 HISTORY — DX: Solitary pulmonary nodule: R91.1

## 2024-01-04 NOTE — Progress Notes (Signed)
 Subjective:    Patient ID: Russell [name], male    DOB: 1951-06-10, 73 y.o.   MRN: [id number]  HPI  ROV 09/29/2023 --73 year old active smoker (45 pack years with a history of hypertension, hyperlipidemia, presumed COPD.  I saw him for cavitary left upper lobe nodule with variable wall thickness with some subtle adjacent groundglass.  We performed a PET scan to further characterize.  Russell [name] does not have any dyspnea, good functional capacity. Russell [name] has some daily cough, less productive.   PET scan 09/09/2023 reviewed by me shows a 3.4 cm left upper lobe cavitary mass with a thin medial wall but a thick lateral wall.  The maximum SUV in the lateral aspect is 14.4 suggestive of either malignancy or an active granulomatous process such as fungal disease.  No evidence of distant disease or metastatic disease.  ROV 01/04/24 --73 year old man with history of tobacco use and presumed COPD.  We have been following a thin-walled 3.4 cm left upper lobe cavitary mass.  Russell [name] will need a bronchoscopy 10/17/2023 that resulted in a small left apical pneumothorax and had to stay overnight.  Russell [name] never had to have a chest tube.  Left upper lobe cytology showed large atypical cells with a background of bronchial epithelium and macrophages with abundant inflammation, fibrotic change and organizing pneumonia.  We repeated his CT scan of the chest 4/21 as below.  Bronchoscopy showed no evidence of fungal disease, AFB negative. Russell [name] is breathing ok. Russell [name] is still having some intermittent sharp CP.   CT scan of the chest 12/26/2023 reviewed by me, shows no hilar or mediastinal adenopathy.  No pulmonary nodules.  There is been an increased size of the left upper lobe peripheral cavitary mass lesion now 4.7 x 3.2 cm.  There is some surrounding groundglass airspace opacity.  No pneumothorax.    Review of Systems As per HPI  Past Medical History:  Diagnosis Date   Hx of adenomatous polyp of colon 03/24/2016   Hyperlipidemia    borderline-  no meds- diet controlled   Hypertension      Family History  Problem Relation Age of Onset   Heart disease Mother    Diabetes Mother    Hyperlipidemia Father    Colon cancer Neg Hx    Esophageal cancer Neg Hx    Rectal cancer Neg Hx    Stomach cancer Neg Hx    Pancreatic cancer Neg Hx    Prostate cancer Neg Hx    Colon polyps Neg Hx      Social History   Socioeconomic History   Marital status: Married    Spouse name: Not on file   Number of children: Not on file   Years of education: Not on file   Highest education level: Bachelor's degree (e.g., BA, AB, BS)  Occupational History   Not on file  Tobacco Use   Smoking status: Some Days    Current packs/day: 1.00    Average packs/day: 1 pack/day for 45.0 years (45.0 ttl pk-yrs)    Types: Cigarettes   Smokeless tobacco: Never   Tobacco comments:    Currently smoking 8-10 per day.  Trying to quit.  Not using anything to help quit.  01/04/24 hfb RN  Vaping Use   Vaping status: Never Used  Substance and Sexual Activity   Alcohol use: Yes    Alcohol/week: 7.0 standard drinks of alcohol    Types: 7 Shots of liquor per week   Drug use: Never   Sexual  activity: Yes  Other Topics Concern   Not on file  Social History Narrative   Not on file   Social Drivers of Health   Financial Resource Strain: Low Risk  (09/29/2023)   Overall Financial Resource Strain (CARDIA)    Difficulty of Paying Living Expenses: Not hard at all  Food Insecurity: No Food Insecurity (10/18/2023)   Hunger Vital Sign    Worried About Running Out of Food in the Last Year: Never true    Ran Out of Food in the Last Year: Never true  Transportation Needs: No Transportation Needs (10/18/2023)   PRAPARE - Administrator, Civil Service (Medical): No    Lack of Transportation (Non-Medical): No  Physical Activity: Insufficiently Active (09/29/2023)   Exercise Vital Sign    Days of Exercise per Week: 1 day    Minutes of Exercise per Session: 10 min   Stress: No Stress Concern Present (09/29/2023)   [employer] of Occupational Health - Occupational Stress Questionnaire    Feeling of Stress : Only a little  Social Connections: Moderately Integrated (10/18/2023)   Social Connection and Isolation Panel [NHANES]    Frequency of Communication with Friends and Family: Once a week    Frequency of Social Gatherings with Friends and Family: Once a week    Attends Religious Services: More than 4 times per year    Active Member of [employer] or Organizations: No    Attends Engineer, structural: More than 4 times per year    Marital Status: Married  Recent Concern: Social Connections - Moderately Isolated (09/29/2023)   Social Connection and Isolation Panel [NHANES]    Frequency of Communication with Friends and Family: Once a week    Frequency of Social Gatherings with Friends and Family: Once a week    Attends Religious Services: More than 4 times per year    Active Member of [employer] or Organizations: No    Attends Engineer, structural: Not on file    Marital Status: Married  Catering manager Violence: Not At Risk (10/18/2023)   Humiliation, Afraid, Rape, and Kick questionnaire    Fear of Current or Ex-Partner: No    Emotionally Abused: No    Physically Abused: No    Sexually Abused: No    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 (Patient not taking: Reported on 10/25/2023)     amLODipine  (NORVASC ) 5 MG tablet TAKE 1 TABLET EVERY DAY FOR BLOOD PRESSURE 90 tablet 3   aspirin  EC 81 MG tablet Take 81 mg by mouth every other day.      augmented betamethasone  dipropionate (DIPROLENE -AF) 0.05 % cream Apply 1  Application topically 2 (two) times daily as needed (rash). At affected areas (avoid face and genitals)     cyanocobalamin  (VITAMIN B12) 1000 MCG tablet Take 1,000 mcg by mouth in the morning.     cycloSPORINE (RESTASIS) 0.05 % ophthalmic emulsion Place 1 drop into both eyes daily.     olmesartan  (BENICAR ) 40 MG tablet Take 1 tablet (40 mg total) by mouth daily. For blood pressure 90 tablet 3   No facility-administered medications prior to visit.  Objective:   Physical Exam Vitals:   01/04/24 1457  BP: 110/66  Pulse: (!) 58  Temp: (!) 97.5 F (36.4 C)  TempSrc: Oral  SpO2: 97%  Weight: 138 lb 12.8 oz (63 kg)  Height: 5\' 10"  (1.778 m)   Gen: Pleasant, well-nourished, in no distress,  normal affect  ENT: No lesions,  mouth clear,  oropharynx clear, no postnasal drip  Neck: No JVD, no stridor  Lungs: No use of accessory muscles, few scattered rhonchi.  No wheezes  Cardiovascular: RRR, heart sounds normal, no murmur or gallops, no peripheral edema  Musculoskeletal: No deformities, no cyanosis or clubbing  Neuro: alert, awake, non focal  Skin: Warm, no lesions or rash      Assessment & Plan:  Pulmonary nodule Reviewed the films with him and his wife in office today.  The left upper lobe lesion continues to enlarge, has an inferior and posterior thick wall.  Etiology unclear.  His bronchoscopy cytology was negative, AFB and fungal cultures are negative.  I talked to him today about the options.  I do not think is a good target for TTNA.  Russell [name] is a marginal candidate for surgical primary resection.  I think the best option is to attempt a repeat biopsy.  Discussed this with him in detail.  Russell [name] agrees with the plan and we will set up a repeat navigational bronchoscopy. We reviewed your CT scan of the chest today.  Your left upper lobe cavitary lesion has enlarged. Recommend that we repeat your bronchoscopy to evaluate the left lung.  This will be done under general anesthesia  at [facility] endoscopy as an outpatient.  We will try to get this scheduled for 01/17/2024.  You will need a designated driver and someone to watch you that day after the procedure.  You need to stop your aspirin  2 days prior. Follow-up with our office about 1 week after the procedure so we can review    Time spent 51 minutes  Racheal Buddle, MD, PhD 01/05/2024, 8:00 AM [facility] [phone] or if no answer before 7:00PM call [phone] For any issues after 7:00PM please call eLink [phone]

## 2024-01-04 NOTE — Patient Instructions (Addendum)
 We reviewed your CT scan of the chest today.  Your left upper lobe cavitary lesion has enlarged. Recommend that we repeat your bronchoscopy to evaluate the left lung.  This will be done under general anesthesia at [facility] endoscopy as an outpatient.  We will try to get this scheduled for 01/17/2024.  You will need a designated driver and someone to watch you that day after the procedure.  You need to stop your aspirin  2 days prior. Follow-up with our office about 1 week after the procedure so we can review

## 2024-01-04 NOTE — H&P (View-Only) (Signed)
 Subjective:    Patient ID: Russell [name], male    DOB: 1951-06-10, 73 y.o.   MRN: [id number]  HPI  ROV 09/29/2023 --73 year old active smoker (45 pack years with a history of hypertension, hyperlipidemia, presumed COPD.  I saw him for cavitary left upper lobe nodule with variable wall thickness with some subtle adjacent groundglass.  We performed a PET scan to further characterize.  He does not have any dyspnea, good functional capacity. He has some daily cough, less productive.   PET scan 09/09/2023 reviewed by me shows a 3.4 cm left upper lobe cavitary mass with a thin medial wall but a thick lateral wall.  The maximum SUV in the lateral aspect is 14.4 suggestive of either malignancy or an active granulomatous process such as fungal disease.  No evidence of distant disease or metastatic disease.  ROV 01/04/24 --73 year old man with history of tobacco use and presumed COPD.  We have been following a thin-walled 3.4 cm left upper lobe cavitary mass.  He will need a bronchoscopy 10/17/2023 that resulted in a small left apical pneumothorax and had to stay overnight.  He never had to have a chest tube.  Left upper lobe cytology showed large atypical cells with a background of bronchial epithelium and macrophages with abundant inflammation, fibrotic change and organizing pneumonia.  We repeated his CT scan of the chest 4/21 as below.  Bronchoscopy showed no evidence of fungal disease, AFB negative. He is breathing ok. He is still having some intermittent sharp CP.   CT scan of the chest 12/26/2023 reviewed by me, shows no hilar or mediastinal adenopathy.  No pulmonary nodules.  There is been an increased size of the left upper lobe peripheral cavitary mass lesion now 4.7 x 3.2 cm.  There is some surrounding groundglass airspace opacity.  No pneumothorax.    Review of Systems As per HPI  Past Medical History:  Diagnosis Date   Hx of adenomatous polyp of colon 03/24/2016   Hyperlipidemia    borderline-  no meds- diet controlled   Hypertension      Family History  Problem Relation Age of Onset   Heart disease Mother    Diabetes Mother    Hyperlipidemia Father    Colon cancer Neg Hx    Esophageal cancer Neg Hx    Rectal cancer Neg Hx    Stomach cancer Neg Hx    Pancreatic cancer Neg Hx    Prostate cancer Neg Hx    Colon polyps Neg Hx      Social History   Socioeconomic History   Marital status: Married    Spouse name: Not on file   Number of children: Not on file   Years of education: Not on file   Highest education level: Bachelor's degree (e.g., BA, AB, BS)  Occupational History   Not on file  Tobacco Use   Smoking status: Some Days    Current packs/day: 1.00    Average packs/day: 1 pack/day for 45.0 years (45.0 ttl pk-yrs)    Types: Cigarettes   Smokeless tobacco: Never   Tobacco comments:    Currently smoking 8-10 per day.  Trying to quit.  Not using anything to help quit.  01/04/24 hfb RN  Vaping Use   Vaping status: Never Used  Substance and Sexual Activity   Alcohol use: Yes    Alcohol/week: 7.0 standard drinks of alcohol    Types: 7 Shots of liquor per week   Drug use: Never   Sexual  activity: Yes  Other Topics Concern   Not on file  Social History Narrative   Not on file   Social Drivers of Health   Financial Resource Strain: Low Risk  (09/29/2023)   Overall Financial Resource Strain (CARDIA)    Difficulty of Paying Living Expenses: Not hard at all  Food Insecurity: No Food Insecurity (10/18/2023)   Hunger Vital Sign    Worried About Running Out of Food in the Last Year: Never true    Ran Out of Food in the Last Year: Never true  Transportation Needs: No Transportation Needs (10/18/2023)   PRAPARE - Administrator, Civil Service (Medical): No    Lack of Transportation (Non-Medical): No  Physical Activity: Insufficiently Active (09/29/2023)   Exercise Vital Sign    Days of Exercise per Week: 1 day    Minutes of Exercise per Session: 10 min   Stress: No Stress Concern Present (09/29/2023)   [employer] of Occupational Health - Occupational Stress Questionnaire    Feeling of Stress : Only a little  Social Connections: Moderately Integrated (10/18/2023)   Social Connection and Isolation Panel [NHANES]    Frequency of Communication with Friends and Family: Once a week    Frequency of Social Gatherings with Friends and Family: Once a week    Attends Religious Services: More than 4 times per year    Active Member of [employer] or Organizations: No    Attends Engineer, structural: More than 4 times per year    Marital Status: Married  Recent Concern: Social Connections - Moderately Isolated (09/29/2023)   Social Connection and Isolation Panel [NHANES]    Frequency of Communication with Friends and Family: Once a week    Frequency of Social Gatherings with Friends and Family: Once a week    Attends Religious Services: More than 4 times per year    Active Member of [employer] or Organizations: No    Attends Engineer, structural: Not on file    Marital Status: Married  Catering manager Violence: Not At Risk (10/18/2023)   Humiliation, Afraid, Rape, and Kick questionnaire    Fear of Current or Ex-Partner: No    Emotionally Abused: No    Physically Abused: No    Sexually Abused: No    He is retired, worked in tobacco factory  No Lobbyist, none currently   No Known Allergies   Outpatient Medications Prior to Visit  Medication Sig Dispense Refill   albuterol  (VENTOLIN  HFA) 108 (90 Base) MCG/ACT inhaler Inhale 2 puffs into the lungs every 6 (six) hours as needed for wheezing or shortness of breath. (Patient not taking: Reported on 10/25/2023)     amLODipine  (NORVASC ) 5 MG tablet TAKE 1 TABLET EVERY DAY FOR BLOOD PRESSURE 90 tablet 3   aspirin  EC 81 MG tablet Take 81 mg by mouth every other day.      augmented betamethasone  dipropionate (DIPROLENE -AF) 0.05 % cream Apply 1  Application topically 2 (two) times daily as needed (rash). At affected areas (avoid face and genitals)     cyanocobalamin  (VITAMIN B12) 1000 MCG tablet Take 1,000 mcg by mouth in the morning.     cycloSPORINE (RESTASIS) 0.05 % ophthalmic emulsion Place 1 drop into both eyes daily.     olmesartan  (BENICAR ) 40 MG tablet Take 1 tablet (40 mg total) by mouth daily. For blood pressure 90 tablet 3   No facility-administered medications prior to visit.  Objective:   Physical Exam Vitals:   01/04/24 1457  BP: 110/66  Pulse: (!) 58  Temp: (!) 97.5 F (36.4 C)  TempSrc: Oral  SpO2: 97%  Weight: 138 lb 12.8 oz (63 kg)  Height: 5\' 10"  (1.778 m)   Gen: Pleasant, well-nourished, in no distress,  normal affect  ENT: No lesions,  mouth clear,  oropharynx clear, no postnasal drip  Neck: No JVD, no stridor  Lungs: No use of accessory muscles, few scattered rhonchi.  No wheezes  Cardiovascular: RRR, heart sounds normal, no murmur or gallops, no peripheral edema  Musculoskeletal: No deformities, no cyanosis or clubbing  Neuro: alert, awake, non focal  Skin: Warm, no lesions or rash      Assessment & Plan:  Pulmonary nodule Reviewed the films with him and his wife in office today.  The left upper lobe lesion continues to enlarge, has an inferior and posterior thick wall.  Etiology unclear.  His bronchoscopy cytology was negative, AFB and fungal cultures are negative.  I talked to him today about the options.  I do not think is a good target for TTNA.  He is a marginal candidate for surgical primary resection.  I think the best option is to attempt a repeat biopsy.  Discussed this with him in detail.  He agrees with the plan and we will set up a repeat navigational bronchoscopy. We reviewed your CT scan of the chest today.  Your left upper lobe cavitary lesion has enlarged. Recommend that we repeat your bronchoscopy to evaluate the left lung.  This will be done under general anesthesia  at [facility] endoscopy as an outpatient.  We will try to get this scheduled for 01/17/2024.  You will need a designated driver and someone to watch you that day after the procedure.  You need to stop your aspirin  2 days prior. Follow-up with our office about 1 week after the procedure so we can review    Time spent 51 minutes  Racheal Buddle, MD, PhD 01/05/2024, 8:00 AM [facility] [phone] or if no answer before 7:00PM call [phone] For any issues after 7:00PM please call eLink [phone]

## 2024-01-05 NOTE — Assessment & Plan Note (Signed)
 Reviewed the films with him and his wife in office today.  The left upper lobe lesion continues to enlarge, has an inferior and posterior thick wall.  Etiology unclear.  His bronchoscopy cytology was negative, AFB and fungal cultures are negative.  I talked to him today about the options.  I do not think is a good target for TTNA.  He is a marginal candidate for surgical primary resection.  I think the best option is to attempt a repeat biopsy.  Discussed this with him in detail.  He agrees with the plan and we will set up a repeat navigational bronchoscopy. We reviewed your CT scan of the chest today.  Your left upper lobe cavitary lesion has enlarged. Recommend that we repeat your bronchoscopy to evaluate the left lung.  This will be done under general anesthesia at [facility] endoscopy as an outpatient.  We will try to get this scheduled for 01/17/2024.  You will need a designated driver and someone to watch you that day after the procedure.  You need to stop your aspirin  2 days prior. Follow-up with our office about 1 week after the procedure so we can review

## 2024-01-13 ENCOUNTER — Encounter (HOSPITAL_COMMUNITY): Payer: Self-pay | Admitting: Emergency Medicine

## 2024-01-13 ENCOUNTER — Other Ambulatory Visit: Payer: Self-pay

## 2024-01-13 NOTE — Progress Notes (Signed)
 PCP - Dr Roise Cleaver Cardiologist - none  CT Chest x-ray - 12/26/23 EKG - 10/17/23 Stress Test - pt unsure ECHO - n/a Cardiac Cath - n/a  ICD Pacemaker/Loop - n/a  Sleep Study -  n/a  Diabetes - n/a  Blood Thinner Instructions:  n/a  Aspirin  Instructions: Stop ASA 2 days prior to procedure per MD.  Last dose will be on 01/14/24.   NPO  Anesthesia review: no  STOP now taking any Aspirin  (unless otherwise instructed by your surgeon), Aleve, Naproxen, Ibuprofen, Motrin, Advil, Goody's, BC's, all herbal medications, fish oil, and all vitamins.   Coronavirus Screening Do you have any of the following symptoms:  Cough Yes Fever (>100.22F)  yes/no: No Runny nose Yes - r/t allergies Sore throat yes/no: No Difficulty breathing/shortness of breath  yes, w/exertion  Have you traveled in the last 14 days and where? yes/no: No  Patient verbalized understanding of instructions that were given via phone.

## 2024-01-17 ENCOUNTER — Ambulatory Visit (HOSPITAL_COMMUNITY)

## 2024-01-17 ENCOUNTER — Encounter (HOSPITAL_COMMUNITY): Payer: Self-pay | Admitting: Emergency Medicine

## 2024-01-17 ENCOUNTER — Other Ambulatory Visit: Payer: Self-pay

## 2024-01-17 ENCOUNTER — Ambulatory Visit (HOSPITAL_COMMUNITY)
Admission: RE | Admit: 2024-01-17 | Discharge: 2024-01-17 | Disposition: A | Discharge status: Home / Self Care | Attending: Emergency Medicine | Admitting: Emergency Medicine

## 2024-01-17 ENCOUNTER — Ambulatory Visit (HOSPITAL_BASED_OUTPATIENT_CLINIC_OR_DEPARTMENT_OTHER): Admitting: Anesthesiology

## 2024-01-17 ENCOUNTER — Encounter (HOSPITAL_COMMUNITY)
Admission: RE | Disposition: A | Discharge status: Home / Self Care | Payer: Self-pay | Source: Home / Self Care | Attending: Emergency Medicine

## 2024-01-17 ENCOUNTER — Ambulatory Visit (HOSPITAL_COMMUNITY): Admitting: Anesthesiology

## 2024-01-17 DIAGNOSIS — R911 Solitary pulmonary nodule: Secondary | ICD-10-CM | POA: Diagnosis present

## 2024-01-17 DIAGNOSIS — K219 Gastro-esophageal reflux disease without esophagitis: Secondary | ICD-10-CM | POA: Diagnosis not present

## 2024-01-17 DIAGNOSIS — J449 Chronic obstructive pulmonary disease, unspecified: Secondary | ICD-10-CM

## 2024-01-17 DIAGNOSIS — F17210 Nicotine dependence, cigarettes, uncomplicated: Secondary | ICD-10-CM

## 2024-01-17 DIAGNOSIS — J8489 Other specified interstitial pulmonary diseases: Secondary | ICD-10-CM | POA: Diagnosis not present

## 2024-01-17 DIAGNOSIS — I10 Essential (primary) hypertension: Secondary | ICD-10-CM

## 2024-01-17 DIAGNOSIS — E785 Hyperlipidemia, unspecified: Secondary | ICD-10-CM | POA: Diagnosis not present

## 2024-01-17 DIAGNOSIS — C3412 Malignant neoplasm of upper lobe, left bronchus or lung: Secondary | ICD-10-CM | POA: Diagnosis not present

## 2024-01-17 DIAGNOSIS — I700 Atherosclerosis of aorta: Secondary | ICD-10-CM | POA: Diagnosis not present

## 2024-01-17 DIAGNOSIS — Z79899 Other long term (current) drug therapy: Secondary | ICD-10-CM | POA: Diagnosis not present

## 2024-01-17 DIAGNOSIS — Z48813 Encounter for surgical aftercare following surgery on the respiratory system: Secondary | ICD-10-CM | POA: Diagnosis not present

## 2024-01-17 DIAGNOSIS — K449 Diaphragmatic hernia without obstruction or gangrene: Secondary | ICD-10-CM | POA: Diagnosis not present

## 2024-01-17 DIAGNOSIS — R918 Other nonspecific abnormal finding of lung field: Secondary | ICD-10-CM | POA: Diagnosis not present

## 2024-01-17 DIAGNOSIS — J984 Other disorders of lung: Secondary | ICD-10-CM | POA: Insufficient documentation

## 2024-01-17 HISTORY — PX: BRONCHIAL BIOPSY: SHX5109

## 2024-01-17 HISTORY — PX: BRONCHIAL BRUSHINGS: SHX5108

## 2024-01-17 HISTORY — DX: Gastro-esophageal reflux disease without esophagitis: K21.9

## 2024-01-17 HISTORY — DX: Dyspnea, unspecified: R06.00

## 2024-01-17 HISTORY — PX: VIDEO BRONCHOSCOPY WITH ENDOBRONCHIAL NAVIGATION: SHX6175

## 2024-01-17 HISTORY — PX: FUDUCIAL PLACEMENT: SHX5083

## 2024-01-17 HISTORY — DX: Pneumonia, unspecified organism: J18.9

## 2024-01-17 HISTORY — PX: BRONCHIAL NEEDLE ASPIRATION BIOPSY: SHX5106

## 2024-01-17 HISTORY — PX: BRONCHIAL WASHINGS: SHX5105

## 2024-01-17 LAB — CBC
HCT: 41.8 % (ref 39.0–52.0)
Hemoglobin: 14 g/dL (ref 13.0–17.0)
MCH: 32.1 pg (ref 26.0–34.0)
MCHC: 33.5 g/dL (ref 30.0–36.0)
MCV: 95.9 fL (ref 80.0–100.0)
Platelets: 235 10*3/uL (ref 150–400)
RBC: 4.36 MIL/uL (ref 4.22–5.81)
RDW: 13.2 % (ref 11.5–15.5)
WBC: 12.9 10*3/uL — ABNORMAL HIGH (ref 4.0–10.5)
nRBC: 0 % (ref 0.0–0.2)

## 2024-01-17 LAB — BASIC METABOLIC PANEL WITH GFR
Anion gap: 7 (ref 5–15)
BUN: 9 mg/dL (ref 8–23)
CO2: 26 mmol/L (ref 22–32)
Calcium: 9 mg/dL (ref 8.9–10.3)
Chloride: 104 mmol/L (ref 98–111)
Creatinine, Ser: 0.8 mg/dL (ref 0.61–1.24)
GFR, Estimated: >60 mL/min (ref >60)
Glucose, Bld: 103 mg/dL — ABNORMAL HIGH (ref 70–99)
Potassium: 4 mmol/L (ref 3.5–5.1)
Sodium: 137 mmol/L (ref 135–145)

## 2024-01-17 SURGERY — VIDEO BRONCHOSCOPY WITH ENDOBRONCHIAL NAVIGATION
Anesthesia: General | Laterality: Left

## 2024-01-17 MED ORDER — ONDANSETRON HCL 4 MG/2ML IJ SOLN
INTRAMUSCULAR | Status: DC | PRN
Start: 2024-01-17 — End: 2024-01-17
  Administered 2024-01-17: 4 mg via INTRAVENOUS

## 2024-01-17 MED ORDER — ACETAMINOPHEN 500 MG PO TABS
1000.0000 mg | ORAL_TABLET | Freq: Once | ORAL | Status: AC
  Administered 2024-01-17: 1000 mg via ORAL
  Filled 2024-01-17: qty 2

## 2024-01-17 MED ORDER — SUGAMMADEX SODIUM 200 MG/2ML IV SOLN
INTRAVENOUS | Status: DC | PRN
  Administered 2024-01-17: 200 mg via INTRAVENOUS

## 2024-01-17 MED ORDER — CHLORHEXIDINE GLUCONATE 0.12 % MT SOLN
OROMUCOSAL | Status: AC
  Administered 2024-01-17: 15 mL
  Filled 2024-01-17: qty 15

## 2024-01-17 MED ORDER — ROCURONIUM BROMIDE 10 MG/ML (PF) SYRINGE
PREFILLED_SYRINGE | INTRAVENOUS | Status: DC | PRN
  Administered 2024-01-17: 50 mg via INTRAVENOUS
  Administered 2024-01-17: 10 mg via INTRAVENOUS

## 2024-01-17 MED ORDER — LIDOCAINE 2% (20 MG/ML) 5 ML SYRINGE
INTRAMUSCULAR | Status: DC | PRN
  Administered 2024-01-17: 60 mg via INTRAVENOUS

## 2024-01-17 MED ORDER — PHENYLEPHRINE HCL-NACL 20-0.9 MG/250ML-% IV SOLN
INTRAVENOUS | Status: DC | PRN
  Administered 2024-01-17: 25 ug/min via INTRAVENOUS

## 2024-01-17 MED ORDER — FENTANYL CITRATE (PF) 250 MCG/5ML IJ SOLN
INTRAMUSCULAR | Status: DC | PRN
  Administered 2024-01-17 (×2): 50 ug via INTRAVENOUS

## 2024-01-17 MED ORDER — PROPOFOL 500 MG/50ML IV EMUL
INTRAVENOUS | Status: DC | PRN
Start: 2024-01-17 — End: 2024-01-17
  Administered 2024-01-17: 150 ug/kg/min via INTRAVENOUS

## 2024-01-17 MED ORDER — LACTATED RINGERS IV SOLN: INTRAVENOUS | Status: DC | PRN

## 2024-01-17 MED ORDER — LACTATED RINGERS IV SOLN
INTRAVENOUS | Status: DC
Start: 2024-01-17 — End: 2024-01-17

## 2024-01-17 MED ORDER — DEXAMETHASONE SODIUM PHOSPHATE 10 MG/ML IJ SOLN
INTRAMUSCULAR | Status: DC | PRN
  Administered 2024-01-17: 10 mg via INTRAVENOUS

## 2024-01-17 MED ORDER — PROPOFOL 10 MG/ML IV BOLUS
INTRAVENOUS | Status: DC | PRN
  Administered 2024-01-17: 100 mg via INTRAVENOUS

## 2024-01-17 MED ORDER — EPHEDRINE SULFATE-NACL 50-0.9 MG/10ML-% IV SOSY
PREFILLED_SYRINGE | INTRAVENOUS | Status: DC | PRN
  Administered 2024-01-17: 5 mg via INTRAVENOUS

## 2024-01-17 SURGICAL SUPPLY — 1 items: superlock fiducial IMPLANT

## 2024-01-17 NOTE — Anesthesia Postprocedure Evaluation (Signed)
 Anesthesia Post Note  Patient: Russell [name]  Procedure(s) Performed: VIDEO BRONCHOSCOPY WITH ENDOBRONCHIAL NAVIGATION (Left) IRRIGATION, BRONCHUS BRONCHOSCOPY, WITH NEEDLE ASPIRATION BIOPSY BRONCHOSCOPY, WITH BRUSH BIOPSY BRONCHOSCOPY, WITH BIOPSY INSERTION, FIDUCIAL MARKER, GOLD     Patient location during evaluation: PACU Anesthesia Type: General Level of consciousness: awake and alert, oriented and patient cooperative Pain management: pain level controlled Vital Signs Assessment: post-procedure vital signs reviewed and stable Respiratory status: spontaneous breathing, nonlabored ventilation and respiratory function stable Cardiovascular status: blood pressure returned to baseline and stable Postop Assessment: no apparent nausea or vomiting Anesthetic complications: no   There were no known notable events for this encounter.  Last Vitals:  Vitals:   01/17/24 0925 01/17/24 0930  BP: 120/68 (!) 113/55  Pulse: 62 62  Resp: 12 14  Temp:    SpO2: 95% 94%    Last Pain:  Vitals:   01/17/24 0930  TempSrc:   PainSc: (P) 0-No pain                 Jacquelyne Matte

## 2024-01-17 NOTE — Discharge Instructions (Signed)
 Flexible Bronchoscopy, Care After This sheet gives you information about how to care for yourself after your test. Your doctor may also give you more specific instructions. If you have problems or questions, contact your doctor. Follow these instructions at home: Eating and drinking When you are wide awake, your numbness is gone and your cough and gag reflexes have come back, you may: Start eating only soft foods. Slowly drink liquids. Six hours after the test, go back to your normal diet. Driving Do not drive for 24 hours if you were given a medicine to help you relax (sedative). Do not drive or use heavy machinery while taking prescription pain medicine. General instructions Take over-the-counter and prescription medicines only as told by your doctor. Return to your normal activities as told. Ask what activities are safe for you. Do not use any products that have nicotine or tobacco in them. This includes cigarettes and e-cigarettes. If you need help quitting, ask your doctor. Keep all follow-up visits as told by your doctor. This is important. It is very important if you had a tissue sample (biopsy) taken. Get help right away if: You have shortness of breath that gets worse. You get light-headed. You feel like you are going to pass out (faint). You have chest pain. You cough up: More than a little blood. More blood than before. Summary Do not use cigarettes. Do not use e-cigarettes. Seek care in the Emergency Department right away if you have chest pain or shortness of breath. Call or MyChart Message our office for any questions or problems at [phone].  Okay to restart your aspirin  on 01/18/2024   This information is not intended to replace advice given to you by your health care provider. Make sure you discuss any questions you have with your health care provider.

## 2024-01-17 NOTE — Anesthesia Procedure Notes (Signed)
 Procedure Name: Intubation Date/Time: 01/17/2024 7:35 AM  Performed by: Loreda Rodriguez, CRNAPre-anesthesia Checklist: Patient identified, Emergency Drugs available, Suction available and Patient being monitored Patient Re-evaluated:Patient Re-evaluated prior to induction Oxygen Delivery Method: Circle System Utilized Preoxygenation: Pre-oxygenation with 100% oxygen Induction Type: IV induction Ventilation: Mask ventilation without difficulty Laryngoscope Size: Glidescope and 3 Tube type: Oral Number of attempts: 1 Airway Equipment and Method: Stylet and Oral airway Placement Confirmation: ETT inserted through vocal cords under direct vision, positive ETCO2 and breath sounds checked- equal and bilateral Secured at: 22 cm Tube secured with: Tape Dental Injury: Teeth and Oropharynx as per pre-operative assessment

## 2024-01-17 NOTE — Transfer of Care (Signed)
 Immediate Anesthesia Transfer of Care Note  Patient: Russell [name]  Procedure(s) Performed: VIDEO BRONCHOSCOPY WITH ENDOBRONCHIAL NAVIGATION (Left) IRRIGATION, BRONCHUS BRONCHOSCOPY, WITH NEEDLE ASPIRATION BIOPSY BRONCHOSCOPY, WITH BRUSH BIOPSY BRONCHOSCOPY, WITH BIOPSY INSERTION, FIDUCIAL MARKER, GOLD  Patient Location: PACU  Anesthesia Type:General  Level of Consciousness: awake, alert , and oriented  Airway & Oxygen Therapy: Patient Spontanous Breathing and Patient connected to nasal cannula oxygen  Post-op Assessment: Report given to RN and Post -op Vital signs reviewed and stable  Post vital signs: Reviewed and stable  Last Vitals:  Vitals Value Taken Time  BP 96/68 01/17/24 0836  Temp 97   Pulse 69 01/17/24 0838  Resp 18   SpO2 96 % 01/17/24 0838  Vitals shown include unfiled device data.  Last Pain:  Vitals:   01/17/24 0634  TempSrc:   PainSc: 0-No pain         Complications: No notable events documented.

## 2024-01-17 NOTE — Op Note (Signed)
 Video Bronchoscopy with Robotic Assisted Bronchoscopic Navigation   Date of Operation: 01/17/2024   Pre-op Diagnosis: Left upper lobe cavitary lesion  Post-op Diagnosis: Same  Surgeon: Racheal Buddle  Assistants: None  Anesthesia: General endotracheal anesthesia  Operation: Flexible video fiberoptic bronchoscopy with robotic assistance and biopsies.  Estimated Blood Loss: Minimal  Complications: None  Indications and History: Russell [name] is a 73 y.o. male with history of  tobacco use and COPD.  He underwent a navigational bronchoscopy in February for a left upper lobe cavitary lesion. His cytology showed atypical cell with inflammation, negative cultures. The lesion enlarged on subsequent CT.  Recommendation made to achieve a tissue diagnosis via robotic assisted navigational bronchoscopy.  The risks, benefits, complications, treatment options and expected outcomes were discussed with the patient.  The possibilities of pneumothorax, pneumonia, reaction to medication, pulmonary aspiration, perforation of a viscus, bleeding, failure to diagnose a condition and creating a complication requiring transfusion or operation were discussed with the patient who freely signed the consent.    Description of Procedure: The patient was seen in the Preoperative Area, was examined and was deemed appropriate to proceed.  The patient was taken to [facility] Endoscopy room 3, identified as Russell [name] and the procedure verified as Flexible Video Fiberoptic Bronchoscopy.  A Time Out was held and the above information confirmed.   Prior to the date of the procedure a high-resolution CT scan of the chest was performed. Utilizing ION software program a virtual tracheobronchial tree was generated to allow the creation of distinct navigation pathways to the patient's parenchymal abnormalities. After being taken to the operating room general anesthesia was initiated and the patient  was orally intubated. The video  fiberoptic bronchoscope was introduced via the endotracheal tube and a general inspection was performed which showed normal right and left lung anatomy. Aspiration of the bilateral mainstems was completed to remove any remaining secretions. Robotic catheter inserted into patient's endotracheal tube.   Target #1 left upper lobe cavitary lesion: The distinct navigation pathways prepared prior to this procedure were then utilized to navigate to patient's lesion identified on CT scan.  Specifically the thickened inferior wall of the cavity was targeted.  The robotic catheter was secured into place and the vision probe was withdrawn.  Lesion location was approximated using fluoroscopy.  Local registration and targeting was performed using [employer] Cios mobile C-arm three-dimensional imaging. Under fluoroscopic guidance transbronchial needle brushings, transbronchial needle biopsies, and transbronchial forceps biopsies were performed to be sent for cytology and pathology.  Needle-in-lesion was confirmed using Cios mobile C-arm.  Respiratory cultures from the left upper lobe airway were obtained to be sent for microbiology.  Under fluoroscopic guidance a single fiducial marker was placed adjacent to the inferior aspect of the cavitary lesion.  At the end of the procedure a general airway inspection was performed and there was no evidence of active bleeding. The bronchoscope was removed.  The patient tolerated the procedure well. There was no significant blood loss and there were no obvious complications. A post-procedural chest x-ray is pending.  Samples Target #1: 1. Transbronchial needle brushings from left upper lobe cavitary lesion 2. Transbronchial Wang needle biopsies from left upper lobe cavitary lesion 3. Transbronchial forceps biopsies from left upper lobe cavitary lesion 4.  Respiratory culture from the left upper lobe airway   Plans:  The patient will be discharged from the PACU to  home when recovered from anesthesia and after chest x-ray is reviewed. We will review the  cytology, pathology and microbiology results with the patient when they become available. Outpatient followup will be with Dr. Baldwin Levee.   Racheal Buddle, MD, PhD 01/17/2024, 8:38 AM [facility] [phone] or if no answer before 7:00PM call [phone] For any issues after 7:00PM please call eLink [phone]

## 2024-01-17 NOTE — Anesthesia Preprocedure Evaluation (Addendum)
 Anesthesia Evaluation  Patient identified by MRN, date of birth, ID band Patient awake    Reviewed: Allergy  & Precautions, H&P , NPO status , Patient's Chart, lab work & pertinent test results  Airway Mallampati: IV  TM Distance: >3 FB Neck ROM: Full    Dental no notable dental hx. (+) Teeth Intact, Dental Advisory Given   Pulmonary COPD, Current Smoker and Patient abstained from smoking. 45 pack year history  LUL cavitary lesion Denies snoring 10 cigg/d currently     + decreased breath sounds      Cardiovascular hypertension (135/81 preop), Pt. on medications Normal cardiovascular exam Rhythm:Regular Rate:Normal     Neuro/Psych negative neurological ROS  negative psych ROS   GI/Hepatic hiatal hernia,GERD  Controlled,,(+)       alcohol use2 etoh/d   Endo/Other  negative endocrine ROS    Renal/GU negative Renal ROS  negative genitourinary   Musculoskeletal  (+) Arthritis , Osteoarthritis,    Abdominal   Peds negative pediatric ROS (+)  Hematology negative hematology ROS (+) Hb 14   Anesthesia Other Findings   Reproductive/Obstetrics negative OB ROS                             Anesthesia Physical Anesthesia Plan  ASA: 3  Anesthesia Plan: General   Post-op Pain Management: Tylenol  PO (pre-op)*   Induction: Intravenous  PONV Risk Score and Plan: 1 and Ondansetron , Dexamethasone and Treatment may vary due to age or medical condition  Airway Management Planned: Oral ETT  Additional Equipment: None  Intra-op Plan:   Post-operative Plan: Extubation in OR  Informed Consent: I have reviewed the patients History and Physical, chart, labs and discussed the procedure including the risks, benefits and alternatives for the proposed anesthesia with the patient or authorized representative who has indicated his/her understanding and acceptance.     Dental advisory given  Plan  Discussed with: CRNA  Anesthesia Plan Comments:        Anesthesia Quick Evaluation

## 2024-01-17 NOTE — Interval H&P Note (Signed)
 History and Physical Interval Note:  01/17/2024 7:17 AM  Russell [name]  has presented today for surgery, with the diagnosis of Left upper lobe cavitary lesion.  The various methods of treatment have been discussed with the patient and family. After consideration of risks, benefits and other options for treatment, the patient has consented to  Procedure(s): BRONCHOSCOPY, WITH BIOPSY USING ELECTROMAGNETIC NAVIGATION (Left) as a surgical intervention.  The patient's history has been reviewed, patient examined, no change in status, stable for surgery.  I have reviewed the patient's chart and labs.  Questions were answered to the patient's satisfaction.     Denson Flake

## 2024-01-18 ENCOUNTER — Ambulatory Visit: Admitting: Acute Care

## 2024-01-18 ENCOUNTER — Encounter (HOSPITAL_COMMUNITY): Payer: Self-pay | Admitting: Emergency Medicine

## 2024-01-18 LAB — ACID FAST SMEAR (AFB, MYCOBACTERIA)
Acid Fast Smear: NEGATIVE
Acid Fast Smear: NEGATIVE

## 2024-01-19 LAB — CULTURE, BAL-QUANTITATIVE W GRAM STAIN

## 2024-01-20 LAB — CYTOLOGY - NON PAP

## 2024-01-22 ENCOUNTER — Ambulatory Visit: Payer: Self-pay | Admitting: Family Medicine

## 2024-01-22 LAB — AEROBIC/ANAEROBIC CULTURE W GRAM STAIN (SURGICAL/DEEP WOUND)
Culture: NO GROWTH
Gram Stain: NONE SEEN

## 2024-01-25 ENCOUNTER — Ambulatory Visit: Admitting: Acute Care

## 2024-01-25 ENCOUNTER — Encounter: Payer: Self-pay | Admitting: Acute Care

## 2024-01-25 VITALS — BP 150/73 | HR 42 | Ht 70.0 in | Wt 136.0 lb

## 2024-01-25 DIAGNOSIS — J069 Acute upper respiratory infection, unspecified: Secondary | ICD-10-CM | POA: Diagnosis not present

## 2024-01-25 DIAGNOSIS — F17210 Nicotine dependence, cigarettes, uncomplicated: Secondary | ICD-10-CM

## 2024-01-25 DIAGNOSIS — C3490 Malignant neoplasm of unspecified part of unspecified bronchus or lung: Secondary | ICD-10-CM

## 2024-01-25 DIAGNOSIS — C3412 Malignant neoplasm of upper lobe, left bronchus or lung: Secondary | ICD-10-CM | POA: Diagnosis not present

## 2024-01-25 DIAGNOSIS — R911 Solitary pulmonary nodule: Secondary | ICD-10-CM | POA: Diagnosis not present

## 2024-01-25 DIAGNOSIS — F17200 Nicotine dependence, unspecified, uncomplicated: Secondary | ICD-10-CM

## 2024-01-25 DIAGNOSIS — Z9889 Other specified postprocedural states: Secondary | ICD-10-CM | POA: Diagnosis not present

## 2024-01-25 MED ORDER — AMOXICILLIN-POT CLAVULANATE 875-125 MG PO TABS: 1.0000 | ORAL_TABLET | Freq: Two times a day (BID) | ORAL | 0 refills | Status: DC

## 2024-01-25 NOTE — Patient Instructions (Addendum)
 It is good to se you today  I am glad you did well after the procedure.  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 I have referred you to both thoracic surgery and radiation oncology. You will get phone calls to get these consults scheduled I have also upgraded the cardiology consult to urgent to see if they can get you in sooner.  I have ordered an MRI brain to complete staging of the cancer. You will get a call to get this scheduled . You did have an upper airway  infection  based on the cultures we did during the bronchoscopy. I have sent in a prescription for Augmentin  1 tablet in the morning and 1 tablet in the evening. Please take probiotic with antibiotic.  Activia or Culturelle tablets ( OTC at any drug store) Good luck with your treatments Follow up with me in 2-3 weeks to make sure your secretions have cleared.  Call us  if you need anything Please contact office for sooner follow up if symptoms do not improve or worsen or seek emergency care

## 2024-01-25 NOTE — Progress Notes (Addendum)
 History of Present Illness Russell [name] is a 73 y.o. male active smoker (45 pack years) with a history of hypertension, colon adenomatous polyp, hyperlipidemia.  Patient is followed by Dr. Shelah for serial monitoring of a left upper lobe cavitary lesion.   Synopsis Pt. participates in lung cancer screening program, and Dr. Shelah has been following a abnormal imaging since 12/2022 with serial imaging. There has been progression of a cavitary lesion in the left upper lobe with interval increase in the soft tissue along the lateral posterior aspect on the scan done September 13, 2023.  Because of this interval progression PET scan was done September 09, 2023. The PET scan showed the 3 point centimeter cavitary mass in the left upper lobe with a thin medial wall but a thick lateral wall had an SUV along the lateral wall of 14.4 previously 3.5.  Possibilities include cavitary malignancy and an active granulomatous process such as a fungal disease.  There were no findings of metastatic disease Because of these findings Dr. Shelah opted for bronchoscopy with biopsy on October 17, 2023.  After bronchoscopy patient developed a small left apical pneumothorax that required an overnight stay in the hospital.  The pneumothorax was monitored but chest tube was never necessary as patient maintained oxygen saturations.  By October 18, 2023 pneumothorax had decreased in size and patient was discharged home.  Follow-up chest x-ray was done October 21, 2023 which showed continued resolution of the pneumothorax however it was not completely resolved. He followed up in the office where we establish that pneumothorax had completely resolved and we also reviewed cytology results from the October 17, 2023 bronchoscopy with biopsies. Biopsies of the left upper lobe mass showed atypical large cells.  Plan at that time was a 30-month follow-up to reevaluate the nodule.  This was due April 2025.  The scan showed an interval increase in  the size of the left upper lobe peripheral nodule to 4.7 x 3.2 cm previously noted to be 3.5 x 2.7 cm.  The patient was seen by Dr. Shelah on January 04, 2024 and he recommended a repeat bronchoscopy to reevaluate the left lung nodule.  The procedure was scheduled for Jan 17, 2024 . Patient is here to follow-up on the results of the bronchoscopy with biopsies   01/25/2024 Pt. Presents for follow up after bronchoscopy with biopsies done Jan 17, 2024. He states he has done well since the procedure. Some scant blood initially, no fever, discolored secretions , or adverse events with anesthesia.   Patient is here today with his wife and daughter.  We have reviewed the results of the biopsy of the left upper lobe.  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  The patient verbalized understanding regarding the diagnosis. We discussed options for treatment to include possible surgery versus radiation oncology.  I have made referrals to both for consultation to discuss possible treatment options with the patient.  Both the patient and his family had multiple questions regarding what treatment might look like.  I did explain to them that I was going to let thoracic surgery and radiation oncology reviewed both of those options with them.  Patient states that he has not smoked a cigarette in 10 days.  We did discuss the fact that if surgery is an option he would need to quit completely.  He verbalized understanding.  The patient did have a positive culture from his BAL  done during the procedure.  Culture was positive for 70,000 colonies of Moraxella catarrhalis (Branhamella).  This is beta-lactamase positive.  I am treating the patient with 10 days of Augmentin .  When I asked the patient what his secretions were like he did endorse around the time of the bronchoscopy that he was having some green to gray secretions.  We will do a 2-week follow-up to ensure  he is better after antibiotic therapy.  I did ask him to make sure he is taking probiotics with the antibiotics to maintain normal gut flora.  Patient's wife is concerned about his weight as she feels he is lost about 3 pounds since his last visit here in February.  I have asked the patient to weigh himself each morning after he wakes up and voids to see if we can get a consistent handle on his weight.  I have encouraged boost or Ensure supplements to his diet to see if he can gain weight.  I did order MRI brain with and without contrast to complete cancer staging.  We did review patient's PFTs and he is borderline for surgical candidacy.  I am going to go ahead and refer him to thoracic surgery and let them evaluate PFTs and determine if they feel he is a good candidate for surgery.  There was notation of coronary artery disease on the patient's CT scan.  He has already been referred to cardiology however his appointment is not until July 2025.  I sent another consult asking them to see the patient more urgently hoping perhaps for a calcium  scoring cardiac scan that may help clear him for surgery if he is indeed a candidate.  Patient states he is not really having any significant issues from a breathing standpoint, other than the discolored secretions at the time of his bronchoscopy with biopsies.  I have asked him to let me know if he has any issues with tolerance of the Augmentin  so that we can replace it with an antibiotic with similar antimicrobial spectrum if needed.    Test Results: Cytology 01/17/2024 B. LUNG, LUL, FINE NEEDLE ASPIRATION:  -Squamous cell carcinoma   C. LUNG, LUL, BRUSHING:  - Squamous cell carcinoma.  See comment   BAL 01/17/2024  70,000 COLONIES/mL MORAXELLA CATARRHALIS(BRANHAMELLA)  BETA LACTAMASE POSITIVE  Performed at [facility], [address] [place], [place] [address]   Fungus Pending  AFB Negative      Latest Ref Rng & Units 01/17/2024    6:50 AM  10/17/2023    8:26 AM 03/31/2023   10:00 AM  CBC  WBC 4.0 - 10.5 K/uL 12.9  9.7  9.5   Hemoglobin 13.0 - 17.0 g/dL 85.9  85.1  83.9   Hematocrit 39.0 - 52.0 % 41.8  43.8  47.3   Platelets 150 - 400 K/uL 235  226  240        Latest Ref Rng & Units 01/17/2024    6:50 AM 10/03/2023    3:07 PM 03/31/2023   10:00 AM  BMP  Glucose 70 - 99 mg/dL 896  70  93   BUN 8 - 23 mg/dL 9  15  14    Creatinine 0.61 - 1.24 mg/dL 9.19  9.02  9.02   BUN/Creat Ratio 10 - 24  15  14    Sodium 135 - 145 mmol/L 137  142  139   Potassium 3.5 - 5.1 mmol/L 4.0  4.4  5.1   Chloride 98 - 111 mmol/L 104  105  103   CO2 22 - 32 mmol/L 26  23  24    Calcium  8.9 - 10.3 mg/dL 9.0  9.2  9.5     BNP No results found for: BNP  ProBNP No results found for: PROBNP  PFT    Component Value Date/Time   FEV1PRE 1.45 09/29/2023 1416   FEV1POST 1.92 09/29/2023 1416   FVCPRE 2.72 09/29/2023 1416   FVCPOST 3.22 09/29/2023 1416   TLC 7.43 09/29/2023 1416   DLCOUNC 17.43 09/29/2023 1416   PREFEV1FVCRT 53 09/29/2023 1416   PSTFEV1FVCRT 60 09/29/2023 1416    DG Chest Port 1 View Result Date: 01/17/2024 CLINICAL DATA:  Status post bronchoscopy. EXAM: PORTABLE CHEST 1 VIEW COMPARISON:  CT chest dated 12/26/2023. Chest radiograph dated 11/01/2023. FINDINGS: The heart size and mediastinal contours are within normal limits. Aortic atherosclerosis. A left upper lobe cavitary mass is again noted with interval placement of fiducial marker at the inferior medial aspect of the lesion. No pneumothorax. The right lung is clear. No pleural effusion. No acute osseous abnormality. IMPRESSION: A left upper lobe cavitary mass is again noted with interval placement of fiducial marker at the inferior medial aspect of the lesion. No pneumothorax. Electronically Signed   By: Harrietta Sherry M.D.   On: 01/17/2024 10:16   DG C-ARM BRONCHOSCOPY Result Date: 01/17/2024 C-ARM BRONCHOSCOPY: Fluoroscopy was utilized by the requesting physician.  No  radiographic interpretation.   CT CHEST WO CONTRAST Result Date: 01/01/2024 CLINICAL DATA:  F/u pulmonary nodule Hx of bronchoscopy No hx of ca Former smoker EXAM: CT CHEST WITHOUT CONTRAST TECHNIQUE: Multidetector CT imaging of the chest was performed following the standard protocol without IV contrast. RADIATION DOSE REDUCTION: This exam was performed according to the departmental dose-optimization program which includes automated exposure control, adjustment of the mA and/or kV according to patient size and/or use of iterative reconstruction technique. COMPARISON:  CT chest 09/16/2023, PET CT 09/09/2023 FINDINGS: Cardiovascular: Normal heart size. No significant pericardial effusion. The thoracic aorta is normal in caliber. Severe atherosclerotic plaque of the thoracic aorta. At least 2 vessel coronary artery calcifications. The main pulmonary artery measures at the upper limits of normal. Mediastinum/Nodes: No gross hilar adenopathy, noting limited sensitivity for the detection of hilar adenopathy on this noncontrast study. No enlarged mediastinal or axillary lymph nodes. Thyroid  gland, trachea, and esophagus demonstrate no significant findings. Lungs/Pleura: No focal consolidation. No pulmonary nodule. Interval increase in size of the left upper lobe peripheral 4.7 x 3.2 cm (from 3.5 x 2.7 cm) cavitary mass with surrounding reticulations and ground-glass airspace opacities. No pleural effusion. No pneumothorax. Upper Abdomen: No acute abnormality. Musculoskeletal: No chest wall abnormality. No suspicious lytic or blastic osseous lesions. No acute displaced fracture. IMPRESSION: 1. Interval increase in size of the left upper lobe peripheral 4.7 x 3.2 cm (from 3.5 x 2.7 cm) cavitary mass with surrounding reticulations and ground-glass airspace opacities. 2. Aortic Atherosclerosis (ICD10-I70.0). Electronically Signed   By: Morgane  Naveau M.D.   On: 01/01/2024 01:58     Past medical hx Past Medical  History:  Diagnosis Date   Dyspnea    with exertion   GERD (gastroesophageal reflux disease)    History of hiatal hernia 07/22/2006   small - dx by endoscopy   Hx of adenomatous polyp of colon 03/24/2016   Hyperlipidemia    Hypertension    Lung nodule 01/04/2024   left upper lobe   Pneumonia    x 1 as teenager     Social  History   Tobacco Use   Smoking status: Every Day    Current packs/day: 1.00    Average packs/day: 1 pack/day for 45.0 years (45.0 ttl pk-yrs)    Types: Cigarettes   Smokeless tobacco: Never   Tobacco comments:    Havent smoked in last 10 days  01/25/2024  Vaping Use   Vaping status: Never Used  Substance Use Topics   Alcohol use: Yes    Alcohol/week: 7.0 standard drinks of alcohol    Types: 7 Shots of liquor per week    Comment: liquor   Drug use: Never    Mr.Dirr reports that he has been smoking cigarettes. He has a 45 pack-year smoking history. He has never used smokeless tobacco. He reports current alcohol use of about 7.0 standard drinks of alcohol per week. He reports that he does not use drugs.  Tobacco Cessation: Ready to quit: Not Answered Counseling given: Not Answered Tobacco comments: Havent smoked in last 10 days  01/25/2024  Patient is working hard to quit smoking completely.  He states he has not had a cigarette in 10 days  Current every day smoker , I spent 3-4 minutes counseling patient on  steps to stop use of tobacco products. I have provided patient with information on receiving free nicotine replacement therapy, and contact numbers for hypnosis for smoking cessation as well as acupuncture for smoking cessation.   Past surgical hx, Family hx, Social hx all reviewed.  Current Outpatient Medications on File Prior to Visit  Medication Sig   albuterol  (VENTOLIN  HFA) 108 (90 Base) MCG/ACT inhaler Inhale 2 puffs into the lungs every 6 (six) hours as needed for wheezing or shortness of breath.   amLODipine  (NORVASC ) 5 MG tablet TAKE 1  TABLET EVERY DAY FOR BLOOD PRESSURE   aspirin  EC 81 MG tablet Take 81 mg by mouth every other day.    augmented betamethasone  dipropionate (DIPROLENE -AF) 0.05 % cream Apply 1 Application topically 2 (two) times daily as needed (rash). At affected areas (avoid face and genitals)   cyanocobalamin  (VITAMIN B12) 1000 MCG tablet Take 1,000 mcg by mouth in the morning.   olmesartan  (BENICAR ) 40 MG tablet Take 1 tablet (40 mg total) by mouth daily. For blood pressure   No current facility-administered medications on file prior to visit.     No Known Allergies  Review Of Systems:  Constitutional:   + weight loss, No night sweats,  Fevers, chills, fatigue, or  lassitude.  HEENT:   No headaches,  Difficulty swallowing,  Tooth/dental problems, or  Sore throat,                No sneezing, itching, ear ache, nasal congestion, post nasal drip,   CV:  No chest pain,  Orthopnea, PND, swelling in lower extremities, anasarca, dizziness, palpitations, syncope.   GI  No heartburn, indigestion, abdominal pain, nausea, vomiting, diarrhea, change in bowel habits, loss of appetite, bloody stools.   Resp: + Baseline  shortness of breath with exertion less at rest.  No excess mucus, no productive cough,  No non-productive cough,  No coughing up of blood.  No change in color of mucus.  + occasional  wheezing.  No chest wall deformity  Skin: no rash or lesions.  GU: no dysuria, change in color of urine, no urgency or frequency.  No flank pain, no hematuria   MS:  No joint pain or swelling.  No decreased range of motion.  No back pain.  Psych:  No change  in mood or affect. No depression or anxiety.  No memory loss.   Vital Signs BP (!) 150/73 (BP Location: Left Arm, Patient Position: Sitting, Cuff Size: Normal)   Pulse (!) 42   Ht 5' 10 (1.778 m)   Wt 136 lb (61.7 kg)   SpO2 97%   BMI 19.51 kg/m    Physical Exam:  General- No distress,  A&Ox3, pleasant ENT: No sinus tenderness, TM clear, pale nasal  mucosa, no oral exudate,no post nasal drip, no LAN Cardiac: S1, S2, regular rate and rhythm, no murmur Chest: No wheeze/ rales/ dullness; no accessory muscle use, no nasal flaring, no sternal retractions, diminished per bases Abd.: Soft Non-tender, nondistended, bowel sounds positive,Body mass index is 19.51 kg/m.  Ext: No clubbing cyanosis, edema, no obvious deformities Neuro:  normal strength, moving all extremities x 4, alert and oriented x 3, appropriate Skin: No rashes, warm and dry, no obvious skin lesions Psych: normal mood and behavior, appropriately concerned about diagnosis   Assessment/Plan New diagnosis lung cancer in a current someday smoker Weight loss Post bronchoscopy with biopsies Positive BAL 70,000 COLONIES/mL MORAXELLA CATARRHALIS(BRANHAMELLA)  BETA LACTAMASE POSITIVE  Plan I am glad you did well after the procedure.  Your biopsy of the Left upper lobe  was positive for squamous cell lung cancer. I have referred you to both thoracic surgery and radiation oncology. You will get phone calls to get these consults scheduled I have also upgraded the cardiology consult to urgent to see if they can get you in sooner.  I have ordered an MRI brain to complete staging of the cancer. You will get a call to get this scheduled . You did have an upper airway  infection  based on the cultures we did during the bronchoscopy. I have sent in a prescription for Augmentin  1 tablet in the morning and 1 tablet in the evening. Please take probiotic with antibiotic.  Activia or Culturelle tablets ( OTC at any drug store) Good luck with your treatments Follow up with me in 2-3 weeks to make sure your secretions have cleared.  Call us  if you need anything Please contact office for sooner follow up if symptoms do not improve or worsen or seek emergency care    I spent 50 minutes dedicated to the care of this patient on the date of this encounter to include pre-visit review of records,  face-to-face time with the patient discussing conditions above, post visit ordering of testing, clinical documentation with the electronic health record, making appropriate referrals as documented, and communicating necessary information to the patient's healthcare team.   Lauraine JULIANNA Lites, NP 01/25/2024  9:09 AM

## 2024-01-26 ENCOUNTER — Other Ambulatory Visit: Payer: Self-pay | Admitting: *Deleted

## 2024-01-26 NOTE — Progress Notes (Signed)
 The proposed treatment discussed in conference is for discussion purpose only and is not a binding recommendation.  The patients have not been physically examined, or presented with their treatment options.  Therefore, final treatment plans cannot be decided.

## 2024-01-27 ENCOUNTER — Ambulatory Visit (HOSPITAL_COMMUNITY)
Admission: RE | Admit: 2024-01-27 | Discharge: 2024-01-27 | Disposition: A | Discharge status: Home / Self Care | Source: Ambulatory Visit | Attending: Acute Care | Admitting: Acute Care

## 2024-01-27 DIAGNOSIS — C3490 Malignant neoplasm of unspecified part of unspecified bronchus or lung: Secondary | ICD-10-CM | POA: Diagnosis not present

## 2024-01-27 DIAGNOSIS — G319 Degenerative disease of nervous system, unspecified: Secondary | ICD-10-CM | POA: Diagnosis not present

## 2024-01-27 DIAGNOSIS — I6782 Cerebral ischemia: Secondary | ICD-10-CM | POA: Diagnosis not present

## 2024-01-27 DIAGNOSIS — Q048 Other specified congenital malformations of brain: Secondary | ICD-10-CM | POA: Diagnosis not present

## 2024-01-27 MED ORDER — GADOBUTROL 1 MMOL/ML IV SOLN
6.0000 mL | Freq: Once | INTRAVENOUS | Status: AC | PRN
  Administered 2024-01-27: 6 mL via INTRAVENOUS

## 2024-01-31 ENCOUNTER — Ambulatory Visit
Admission: RE | Admit: 2024-01-31 | Discharge: 2024-01-31 | Discharge status: Home / Self Care | Source: Ambulatory Visit | Attending: Radiation Oncology | Admitting: Radiation Oncology

## 2024-01-31 ENCOUNTER — Ambulatory Visit
Admission: RE | Admit: 2024-01-31 | Discharge: 2024-01-31 | Disposition: A | Discharge status: Home / Self Care | Source: Ambulatory Visit | Attending: Radiation Oncology | Admitting: Radiation Oncology

## 2024-01-31 ENCOUNTER — Encounter: Payer: Self-pay | Admitting: Radiation Oncology

## 2024-01-31 VITALS — BP 140/69 | HR 50 | Temp 97.7°F | Resp 18 | Ht 70.0 in | Wt 138.2 lb

## 2024-01-31 DIAGNOSIS — Z79899 Other long term (current) drug therapy: Secondary | ICD-10-CM | POA: Diagnosis not present

## 2024-01-31 DIAGNOSIS — F17210 Nicotine dependence, cigarettes, uncomplicated: Secondary | ICD-10-CM | POA: Insufficient documentation

## 2024-01-31 DIAGNOSIS — I6782 Cerebral ischemia: Secondary | ICD-10-CM | POA: Insufficient documentation

## 2024-01-31 DIAGNOSIS — E785 Hyperlipidemia, unspecified: Secondary | ICD-10-CM | POA: Diagnosis not present

## 2024-01-31 DIAGNOSIS — Z860101 Personal history of adenomatous and serrated colon polyps: Secondary | ICD-10-CM | POA: Insufficient documentation

## 2024-01-31 DIAGNOSIS — I10 Essential (primary) hypertension: Secondary | ICD-10-CM | POA: Insufficient documentation

## 2024-01-31 DIAGNOSIS — C3412 Malignant neoplasm of upper lobe, left bronchus or lung: Secondary | ICD-10-CM

## 2024-01-31 DIAGNOSIS — K219 Gastro-esophageal reflux disease without esophagitis: Secondary | ICD-10-CM | POA: Insufficient documentation

## 2024-01-31 NOTE — Progress Notes (Signed)
 Radiation Oncology         [phone] ________________________________  Name: Russell [name]        MRN: [id number]  Date of Service: 01/31/2024 DOB: Sep 27, 1950  WG:[id number], Vallorie Gayer, MD  Denson Flake, MD     REFERRING PHYSICIAN: Denson Flake, MD   DIAGNOSIS: The encounter diagnosis was Malignant neoplasm of upper lobe of left lung (HCC).   Stage IIA (cT2b, N0, M0) squamous cell carcinoma, NSCLC, of the LUL  HISTORY OF PRESENT ILLNESS: Russell [name] is a 73 y.o. male seen at the request of Dr. Baldwin Levee for a newly diagnosed non-small cell lung cancer. Patient is an active smoker (45 pack years) and was being followed by the annual lung screening program. A cavitary lesion was first noted on chest CT on 06/30/2021. At that time, it was benign in appearance, and annual CT follow-up was recommended.   Follow-up CT of the chest on 12/08/2022 demonstrated the LUL nodule to be enlarging, measuring at 2.6 cm in greatest dimension. Subsequent CT of the chest on 05/04/2023 showed further enlargement of the suspicious nodule, measuring 3.4 cm at that time.   PET on 05/19/2023 showed mild SUV activity (max of 3.49) correlating with the cavitary lesion. Per Dr. Lacinda Pica recommendations, the patient proceeded with a follow-up PET in 3 months due to the unclear significance of these findings. PET on 09/09/2023 demonstrated increased hypermetabolism (14.4) in the LUL. No findings of metastatic disease were appreciated at that time.   Patient proceeded with a biopsy on 10/17/2023 that showed atypical large cells. Per pulmonology's recommendations the patient proceeded with a 50-month follow-up imaging. Chest CT on 12/26/2023 showed the LUL peripheral nodule to have increased in size, now measuring 4.7 x 3.2 cm. Repeat biopsy of the LUL nodule on 01/17/2024 revealed squamous cell carcinoma. MRI of the brain on 01/27/2024 demonstrated no evidence of metastatic disease.  He reviewed the results of his biopsy with  Dara Ear, NP on 01/25/2024 and she kindly referred the patient to thoracic surgery and radiation to discuss treatment options.    PREVIOUS RADIATION THERAPY: No   PAST MEDICAL HISTORY:  Past Medical History:  Diagnosis Date   Dyspnea    with exertion   GERD (gastroesophageal reflux disease)    History of hiatal hernia 07/22/2006   small - dx by endoscopy   Hx of adenomatous polyp of colon 03/24/2016   Hyperlipidemia    Hypertension    Lung nodule 01/04/2024   left upper lobe   Pneumonia    x 1 as teenager       PAST SURGICAL HISTORY: Past Surgical History:  Procedure Laterality Date   BRONCHIAL BIOPSY  10/17/2023   Procedure: BRONCHIAL BIOPSIES;  Surgeon: Denson Flake, MD;  Location: [facility] ENDOSCOPY;  Service: Pulmonary;;   BRONCHIAL BIOPSY  01/17/2024   Procedure: BRONCHOSCOPY, WITH BIOPSY;  Surgeon: Denson Flake, MD;  Location: [facility] ENDOSCOPY;  Service: Pulmonary;;   BRONCHIAL BRUSHINGS  10/17/2023   Procedure: BRONCHIAL BRUSHINGS;  Surgeon: Denson Flake, MD;  Location: [facility] ENDOSCOPY;  Service: Pulmonary;;   BRONCHIAL BRUSHINGS  01/17/2024   Procedure: BRONCHOSCOPY, WITH BRUSH BIOPSY;  Surgeon: Denson Flake, MD;  Location: [facility] ENDOSCOPY;  Service: Pulmonary;;   BRONCHIAL NEEDLE ASPIRATION BIOPSY  10/17/2023   Procedure: BRONCHIAL NEEDLE ASPIRATION BIOPSIES;  Surgeon: Denson Flake, MD;  Location: [facility] ENDOSCOPY;  Service: Pulmonary;;   BRONCHIAL NEEDLE ASPIRATION BIOPSY  01/17/2024   Procedure: BRONCHOSCOPY, WITH NEEDLE ASPIRATION BIOPSY;  Surgeon: Denson Flake, MD;  Location: [facility] ENDOSCOPY;  Service: Pulmonary;;   BRONCHIAL WASHINGS  10/17/2023   Procedure: BRONCHIAL WASHINGS;  Surgeon: Denson Flake, MD;  Location: [facility] ENDOSCOPY;  Service: Pulmonary;;   BRONCHIAL WASHINGS  01/17/2024   Procedure: IRRIGATION, BRONCHUS;  Surgeon: Denson Flake, MD;  Location: [facility] ENDOSCOPY;  Service: Pulmonary;;   COLONOSCOPY  09/07/2003   gessner - hx polyp; colonoscopy x  several - last one on 06/04/21   cyst removal from gum     FRACTURE SURGERY Left age 5   Left Knee   FUDUCIAL PLACEMENT  01/17/2024   Procedure: INSERTION, FIDUCIAL MARKER, GOLD;  Surgeon: Denson Flake, MD;  Location: [facility] ENDOSCOPY;  Service: Pulmonary;;   HERNIA REPAIR     UPPER GASTROINTESTINAL ENDOSCOPY     UPPER GI ENDOSCOPY     Normal   VIDEO BRONCHOSCOPY WITH ENDOBRONCHIAL NAVIGATION Left 01/17/2024   Procedure: VIDEO BRONCHOSCOPY WITH ENDOBRONCHIAL NAVIGATION;  Surgeon: Denson Flake, MD;  Location: [facility] ENDOSCOPY;  Service: Pulmonary;  Laterality: Left;   WISDOM TOOTH EXTRACTION       FAMILY HISTORY:  Family History  Problem Relation Age of Onset   Heart disease Mother    Diabetes Mother    Hyperlipidemia Father    Colon cancer Neg Hx    Esophageal cancer Neg Hx    Rectal cancer Neg Hx    Stomach cancer Neg Hx    Pancreatic cancer Neg Hx    Prostate cancer Neg Hx    Colon polyps Neg Hx      SOCIAL HISTORY:  reports that he has been smoking cigarettes. He has a 45 pack-year smoking history. He has never used smokeless tobacco. He reports current alcohol use of about 7.0 standard drinks of alcohol per week. He reports that he does not use drugs.   ALLERGIES: Patient has no known allergies.   MEDICATIONS:  Current Outpatient Medications  Medication Sig Dispense Refill   albuterol  (VENTOLIN  HFA) 108 (90 Base) MCG/ACT inhaler Inhale 2 puffs into the lungs every 6 (six) hours as needed for wheezing or shortness of breath.     amLODipine  (NORVASC ) 5 MG tablet TAKE 1 TABLET EVERY DAY FOR BLOOD PRESSURE 90 tablet 3   amoxicillin -clavulanate (AUGMENTIN ) 875-125 MG tablet Take 1 tablet by mouth 2 (two) times daily. 20 tablet 0   aspirin  EC 81 MG tablet Take 81 mg by mouth every other day.      augmented betamethasone  dipropionate (DIPROLENE -AF) 0.05 % cream Apply 1 Application topically 2 (two) times daily as needed (rash). At affected areas (avoid face and genitals)      cyanocobalamin  (VITAMIN B12) 1000 MCG tablet Take 1,000 mcg by mouth in the morning.     olmesartan  (BENICAR ) 40 MG tablet Take 1 tablet (40 mg total) by mouth daily. For blood pressure 90 tablet 3   No current facility-administered medications for this encounter.     REVIEW OF SYSTEMS: On review of systems, the patient reports that he is doing well overall. He notes a productive cough consisting of green, rarely brown/red, since the bronchoscopy.      PHYSICAL EXAM:  Wt Readings from Last 3 Encounters:  01/31/24 138 lb 4 oz (62.7 kg)  01/25/24 136 lb (61.7 kg)  01/17/24 140 lb (63.5 kg)   Temp Readings from Last 3 Encounters:  01/31/24 97.7 F (36.5 C) (Temporal)  01/17/24 98.2 F (36.8 C) (Temporal)  01/04/24 (!) 97.5 F (36.4 C) (Oral)  BP Readings from Last 3 Encounters:  01/31/24 (!) 140/69  01/25/24 (!) 150/73  01/17/24 (!) 113/55   Pulse Readings from Last 3 Encounters:  01/31/24 (!) 50  01/25/24 (!) 42  01/17/24 62   Pain Assessment Pain Score: 0-No pain/10  In general this is a well appearing male in no acute distress. He's alert and oriented x4 and appropriate throughout the examination. Cardiopulmonary assessment is negative for acute distress and he exhibits normal effort.     ECOG = 0  0 - Asymptomatic (Fully active, able to carry on all predisease activities without restriction)  1 - Symptomatic but completely ambulatory (Restricted in physically strenuous activity but ambulatory and able to carry out work of a light or sedentary nature. For example, light housework, office work)  2 - Symptomatic, <50% in bed during the day (Ambulatory and capable of all self care but unable to carry out any work activities. Up and about more than 50% of waking hours)  3 - Symptomatic, >50% in bed, but not bedbound (Capable of only limited self-care, confined to bed or chair 50% or more of waking hours)  4 - Bedbound (Completely disabled. Cannot carry on any  self-care. Totally confined to bed or chair)  5 - Death   Aurea Blossom MM, Creech RH, Tormey DC, et al. [id number]). "Toxicity and response criteria of the [facility]". Am. Hillard Lowes. Oncol. 5 (6): 649-55    LABORATORY DATA:  Lab Results  Component Value Date   WBC 12.9 (H) 01/17/2024   HGB 14.0 01/17/2024   HCT 41.8 01/17/2024   MCV 95.9 01/17/2024   PLT 235 01/17/2024   Lab Results  Component Value Date   NA 137 01/17/2024   K 4.0 01/17/2024   CL 104 01/17/2024   CO2 26 01/17/2024   Lab Results  Component Value Date   ALT 10 10/03/2023   AST 16 10/03/2023   ALKPHOS 70 10/03/2023   BILITOT 0.2 10/03/2023      RADIOGRAPHY: MR BRAIN W WO CONTRAST Result Date: 01/27/2024 CLINICAL DATA:  Provided history: Non-small cell lung cancer, staging. Malignant neoplasm of unspecified part of unspecified bronchus or lung. EXAM: MRI HEAD WITHOUT AND WITH CONTRAST TECHNIQUE: Multiplanar, multiecho pulse sequences of the brain and surrounding structures were obtained without and with intravenous contrast. CONTRAST:  6mL GADAVIST GADOBUTROL 1 MMOL/ML IV SOLN COMPARISON:  6 mL Vueway intravenous contrast. FINDINGS: Brain: Mild generalized cerebral atrophy. Multifocal T2 FLAIR hyperintense signal abnormality within the cerebral white matter, nonspecific but compatible with mild chronic small vessel ischemic disease. No cortical encephalomalacia is identified. There is no acute infarct. No evidence of an intracranial mass. No chronic intracranial blood products. No extra-axial fluid collection. No midline shift. No pathologic intracranial enhancement identified. Vascular: Maintained flow voids within the proximal large arterial vessels. 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 Skull and upper cervical spine: No focal worrisome marrow lesion. Incompletely assessed cervical spondylosis. Sinuses/Orbits: No mass or acute finding within the imaged  orbits. Prior bilateral ocular lens replacement. Mild mucosal thickening within the left maxillary sinus. IMPRESSION: 1. No evidence of intracranial metastatic disease. 2. Mild chronic small vessel ischemic changes within the cerebral white matter. 3. Mild generalized cerebral atrophy. Electronically Signed   By: Bascom Lily D.O.   On: 01/27/2024 18:29   DG Chest Port 1 View Result Date: 01/17/2024 CLINICAL DATA:  Status post bronchoscopy. EXAM: PORTABLE CHEST 1 VIEW COMPARISON:  CT chest dated 12/26/2023. Chest radiograph dated  11/01/2023. FINDINGS: The heart size and mediastinal contours are within normal limits. Aortic atherosclerosis. A left upper lobe cavitary mass is again noted with interval placement of fiducial marker at the inferior medial aspect of the lesion. No pneumothorax. The right lung is clear. No pleural effusion. No acute osseous abnormality. IMPRESSION: A left upper lobe cavitary mass is again noted with interval placement of fiducial marker at the inferior medial aspect of the lesion. No pneumothorax. Electronically Signed   By: Mannie Seek M.D.   On: 01/17/2024 10:16   DG C-ARM BRONCHOSCOPY Result Date: 01/17/2024 C-ARM BRONCHOSCOPY: Fluoroscopy was utilized by the requesting physician.  No radiographic interpretation.       IMPRESSION/PLAN: 1. Stage IIA (cT2b, N0, M0) squamous cell carcinoma, NSCLC, of the LUL  We have reviewed this patient's current work-up. Most recent imaging demonstrates enlarging LUL cavitary lesion, measuring 4.7 cm in greatest dimension, biopsy-proven squamous cell carcinoma. He is scheduled to meet with Dr. Luna Salinas on 02/28/2024 to discuss surgical options. If he is not a candidate for surgery, Dr. Jeryl Moris recommends radiation to the LUL lesion. We discussed stereotactic body radiotherapy (5 fractions given every other day) verus ultra-hypofractionated radiotherapy (8 fractions given consecutively). We explained that we could determine the optimal  treatment plan after CT simulation.   Today, I talked to the patient and family about the findings and work-up thus far.  We discussed the natural history of lung cancer and general treatment, highlighting the role of radiotherapy in the management.  We discussed the available radiation techniques, and focused on the details of logistics and delivery.  We reviewed the anticipated acute and late sequelae associated with radiation in this setting.  The patient was encouraged to ask questions that I answered to the best of my ability.   Patient will let us  know after his treatment decision after his consultation with cardiothoracic surgery. He was given our number and encouraged to reach out with any questions or concerns in the meantime.    In a visit lasting 60 minutes, greater than 50% of the time was spent face to face discussing the patient's condition, in preparation for the discussion, and coordinating the patient's care.   The above documentation reflects my direct findings during this shared patient visit. Please see the separate note by Dr. Jeryl Moris on this date for the remainder of the patient's plan of care.    Julio Ohm, PA-C    **Disclaimer: This note was dictated with voice recognition software. Similar sounding words can inadvertently be transcribed and this note may contain transcription errors which may not have been corrected upon publication of note.**

## 2024-01-31 NOTE — Progress Notes (Signed)
 Thoracic Location of Tumor / Histology: LUL Lung  Patient has been followed in the lung cancer screening program and Dr. Baldwin Levee has been following a abnormal imaging since 12/2022 with serial imaging.  On recent imaging in January 2025 progression of a cavitary lesion in the left upper lobe with interval increase in the soft tissue along the lateral posterior aspect.   MRI Brain 01/27/2024: No evidence of intracranial metastatic disease.   Bronchoscopy 10/17/2023  PET 09/09/2023:The 3.4 cm cavitary mass in the left upper lobe has a thin medial wall but a thick wall lateral wall, maximum SUV especially along the lateral wall at 14.4 (previously 3.5). Possibilities include cavitary malignancy and active granulomatous process such as fungal disease.  No findings of metastatic disease.   Biopsies of LUL Lung Mass 01/17/2024  Biopsies of LUL Lung Mass 10/17/2023   Past/Anticipated interventions by cardiothoracic surgery, if any:  Dr. Luna Salinas 02/28/2024  Past/Anticipated interventions by medical oncology, if any:    Tobacco/Marijuana/Snuff/ETOH use: Former smoker, quit 3 weeks ago.  Signs/Symptoms Weight changes, if any: Stable. Respiratory complaints, if any: Denies SOB. Hemoptysis, if any: Has URI, coughing up mucus clear/brown.  Denies hemoptysis. Pain issues, if any: Denies chest pain, pressure, or tightness.   SAFETY ISSUES: Prior radiation? No Pacemaker/ICD? No  Possible current pregnancy? N/a Is the patient on methotrexate? No  Current Complaints / other details:

## 2024-02-01 ENCOUNTER — Ambulatory Visit: Attending: Cardiology | Admitting: Cardiology

## 2024-02-01 ENCOUNTER — Encounter: Payer: Self-pay | Admitting: Cardiology

## 2024-02-01 VITALS — BP 118/76 | HR 50 | Ht 70.0 in | Wt 141.2 lb

## 2024-02-01 DIAGNOSIS — Z01818 Encounter for other preprocedural examination: Secondary | ICD-10-CM | POA: Insufficient documentation

## 2024-02-01 DIAGNOSIS — I2510 Atherosclerotic heart disease of native coronary artery without angina pectoris: Secondary | ICD-10-CM

## 2024-02-01 DIAGNOSIS — I10 Essential (primary) hypertension: Secondary | ICD-10-CM

## 2024-02-01 LAB — FUNGAL ORGANISM REFLEX

## 2024-02-01 LAB — FUNGUS CULTURE RESULT

## 2024-02-01 LAB — FUNGUS CULTURE WITH STAIN

## 2024-02-01 MED ORDER — ROSUVASTATIN CALCIUM 10 MG PO TABS: 10.0000 mg | ORAL_TABLET | ORAL | 1 refills | Status: AC

## 2024-02-01 NOTE — Progress Notes (Signed)
 Cardiology Office Note:  .   Date:  02/01/2024  ID:  Russell [name], DOB 1951/07/27, MRN [id number] PCP: Russell Cleaver, MD  [facility] Providers Cardiologist:  Russell Ivy, MD PCP: Russell Cleaver, MD  Chief Complaint  Patient presents with   New Patient (Initial Visit)   Coronary Artery Disease     Russell [name] is a 73 y.o. male with hypertension, mild hyperlipidemia, former 45 PY smoker, coronary calcification, left upper lobe lung cavitary lesion w/squamous cell carcinoma  Discussed the use of AI scribe software for clinical note transcription with the patient, who gave verbal consent to proceed.  History of Present Illness Russell [name] is a 73 year old male with lung cancer who presents for cardiac evaluation prior to potential surgery. He was referred by another doctor due to calcium seen in his heart arteries on a CT scan.  He has a significant smoking history of over 45 years, having quit approximately two and a half weeks ago. 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 He experiences no chest pain and only shortness of breath when carrying heavy objects over a distance. He can climb a flight of stairs without stopping.  Current medications include amlodipine  and olmesartan  for hypertension, and he takes a baby aspirin . He is not on cholesterol-lowering medications. His family history is notable for heart disease; his mother had heart disease in her forties and died at 22 from a myocardial infarction.      Vitals:   02/01/24 1007 02/01/24 1010  BP: 112/70 118/76  Pulse: (!) 50   SpO2: 98%       Review of Systems  Cardiovascular:  Negative for chest pain, dyspnea on exertion, leg swelling, palpitations and syncope.        Studies Reviewed: Russell [name]        EKG 02/01/2024: Sinus bradycardia 51 bpm Minimal voltage criteria for LVH, may be normal variant ( Sokolow-Lyon  ) Septal infarct (cited on or before 17-Oct-2023) When compared with ECG of 17-Oct-2023 08:27, No significant change was found    Independently interpreted 03/2023-01/2024: Chol 185, TG 40, HDL 102, LDL 75 Hb 14 Cr 0.8  CT chest 12/2023: 1. Interval increase in size of the left upper lobe peripheral 4.7 x 3.2 cm (from 3.5 x 2.7 cm) cavitary mass with surrounding reticulations and ground-glass airspace opacities. 2. Aortic Atherosclerosis (ICD10-I70.0). Cardiovascular: Normal heart size. No significant pericardial effusion. The thoracic aorta is normal in caliber. Severe atherosclerotic plaque of the thoracic aorta. At least 2 vessel coronary artery calcifications. The main pulmonary artery measures at the upper limits of normal.    Physical Exam Vitals and nursing note reviewed.  Constitutional:      General: He is not in acute distress. Neck:     Vascular: No JVD.  Cardiovascular:     Rate and Rhythm: Normal rate and regular rhythm.     Heart sounds: Normal heart sounds. No murmur heard. Pulmonary:     Effort: Pulmonary effort is normal.     Breath sounds: Normal breath sounds. No wheezing or rales.  Musculoskeletal:     Right lower leg: No edema.     Left lower leg: No edema.      VISIT DIAGNOSES:   ICD-10-CM   1. Primary hypertension  I10 EKG 12-Lead    2. Pre-op evaluation  Z01.818 ECHOCARDIOGRAM COMPLETE    3. Coronary artery calcification  I25.10  Russell [name] is a 73 y.o. male with hypertension, mild hyperlipidemia, former 61 PY smoker, coronary calcification, left upper lobe lung cavitary lesion w/squamous cell carcinoma Assessment & Plan Coronary artery calcification Coronary artery disease with calcification identified on CT scan. No angina or angina equivalent symptoms. Good functional capacity at baseline. Low perioperative cardiac risk. Will obtain baseline echocardiogram  Continue Aspirin  81 mg, patient currently takes it every other day.  Okay to hold up to 5 days before any upcoming surgery for cavitary lung lesion. He has upcoming appt for consultation with Dr. Luna Salinas. - Prescribe Crestor 10 mg every other day to reduce cholesterol and cardiac risk.  Hypertension: Hypertension managed with amlodipine  and olmesartan . Blood pressure control adequate.     Meds ordered this encounter  Medications   rosuvastatin (CRESTOR) 10 MG tablet    Sig: Take 1 tablet (10 mg total) by mouth every other day.    Dispense:  90 tablet    Refill:  1     F/u in 6 months  Signed, Cody Das, MD

## 2024-02-01 NOTE — Patient Instructions (Addendum)
 Medication Instructions:  START Crestor 10 mg every other day   *If you need a refill on your cardiac medications before your next appointment, please call your pharmacy*  Testing/Procedures: Echo   Your physician has requested that you have an echocardiogram. Echocardiography is a painless test that uses sound waves to create images of your heart. It provides your doctor with information about the size and shape of your heart and how well your heart's chambers and valves are working. This procedure takes approximately one hour. There are no restrictions for this procedure. Please do NOT wear cologne, perfume, aftershave, or lotions (deodorant is allowed). Please arrive 15 minutes prior to your appointment time.  Please note: We ask at that you not bring children with you during ultrasound (echo/ vascular) testing. Due to room size and safety concerns, children are not allowed in the ultrasound rooms during exams. Our front office staff cannot provide observation of children in our lobby area while testing is being conducted. An adult accompanying a patient to their appointment will only be allowed in the ultrasound room at the discretion of the ultrasound technician under special circumstances. We apologize for any inconvenience.   Follow-Up: At [facility], you and your health needs are our priority.  As part of our continuing mission to provide you with exceptional heart care, our providers are all part of one team.  This team includes your primary Cardiologist (physician) and Advanced Practice Providers or APPs (Physician Assistants and Nurse Practitioners) who all work together to provide you with the care you need, when you need it.  Your next appointment:   6 month(s)  Provider:   With APP

## 2024-02-06 ENCOUNTER — Ambulatory Visit (HOSPITAL_COMMUNITY)
Admission: RE | Admit: 2024-02-06 | Discharge: 2024-02-06 | Disposition: A | Discharge status: Home / Self Care | Source: Ambulatory Visit | Attending: Cardiovascular Disease | Admitting: Cardiovascular Disease

## 2024-02-06 ENCOUNTER — Ambulatory Visit: Payer: Self-pay | Admitting: Cardiology

## 2024-02-06 DIAGNOSIS — Z01810 Encounter for preprocedural cardiovascular examination: Secondary | ICD-10-CM | POA: Diagnosis not present

## 2024-02-06 DIAGNOSIS — I10 Essential (primary) hypertension: Secondary | ICD-10-CM | POA: Insufficient documentation

## 2024-02-06 DIAGNOSIS — Z01818 Encounter for other preprocedural examination: Secondary | ICD-10-CM

## 2024-02-06 DIAGNOSIS — R0600 Dyspnea, unspecified: Secondary | ICD-10-CM | POA: Insufficient documentation

## 2024-02-06 DIAGNOSIS — E785 Hyperlipidemia, unspecified: Secondary | ICD-10-CM | POA: Diagnosis not present

## 2024-02-06 DIAGNOSIS — I071 Rheumatic tricuspid insufficiency: Secondary | ICD-10-CM | POA: Diagnosis not present

## 2024-02-06 LAB — ECHOCARDIOGRAM COMPLETE
Area-P 1/2: 3.15 cm2
S' Lateral: 3 cm

## 2024-02-06 NOTE — Progress Notes (Signed)
 Frequent PVCs noted during echocardiogram.  Upper limit normal of aortic root, likely of no clinical significance. Overall, reassuring echocardiogram. Okay to proceed with upcoming workup and management for probable lung cancer.  Thanks MJP

## 2024-02-15 ENCOUNTER — Ambulatory Visit: Admitting: Acute Care

## 2024-02-15 ENCOUNTER — Encounter: Payer: Self-pay | Admitting: Acute Care

## 2024-02-15 VITALS — BP 132/68 | HR 50 | Ht 69.0 in | Wt 142.2 lb

## 2024-02-15 DIAGNOSIS — F17210 Nicotine dependence, cigarettes, uncomplicated: Secondary | ICD-10-CM

## 2024-02-15 DIAGNOSIS — C3412 Malignant neoplasm of upper lobe, left bronchus or lung: Secondary | ICD-10-CM | POA: Diagnosis not present

## 2024-02-15 NOTE — Patient Instructions (Addendum)
 It is good to see you today. I am glad you are feeling well. Follow up with Dr. Luna Salinas as is scheduled. Call Dr. Alana Hoyle office and ask about the impact of radiation on breathing ( scarring) . Once you have seen Dr. Luna Salinas, and pick your treatment option, they can start treating your cancer. Call us  if you need us . Please contact office for sooner follow up if symptoms do not improve or worsen or seek emergency care

## 2024-02-15 NOTE — Progress Notes (Addendum)
 History of Present Illness Russell [name] is a 73 y.o. active smoker (45 pack years) with a history of hypertension, colon adenomatous polyp, hyperlipidemia. Patient is followed by Dr. Baldwin [name] for serial monitoring of a left upper lobe cavitary lesion.     Synopsis Pt. participates in lung cancer screening program, and Dr. Baldwin [name] has been following a abnormal imaging since 12/2022 with serial imaging. There has been progression of a cavitary lesion in the left upper lobe with interval increase in the soft tissue along the lateral posterior aspect on the scan done September 13, 2023.  Because of this interval progression PET scan was done September 09, 2023. The PET scan showed the 3 point centimeter cavitary mass in the left upper lobe with a thin medial wall but a thick lateral wall had an SUV along the lateral wall of 14.4 previously 3.5.  Possibilities include cavitary malignancy and an active granulomatous process such as a fungal disease.  There were no findings of metastatic disease Because of these findings Dr. Baldwin [name] opted for bronchoscopy with biopsy on October 17, 2023.  After bronchoscopy patient developed a small left apical pneumothorax that required an overnight stay in the hospital.  The pneumothorax was monitored but chest tube was never necessary as patient maintained oxygen saturations.  By October 18, 2023 pneumothorax had decreased in size and patient was discharged home.  Follow-up chest x-ray was done October 21, 2023 which showed continued resolution of the pneumothorax however it was not completely resolved. He followed up in the office where we establish that pneumothorax had completely resolved and we also reviewed cytology results from the October 17, 2023 bronchoscopy with biopsies. Biopsies of the left upper lobe mass showed atypical large cells.  Plan at that time was a 38-month follow-up to reevaluate the nodule.  This was due April 2025.  The scan showed an interval increase in the  size of the left upper lobe peripheral nodule to 4.7 x 3.2 cm previously noted to be 3.5 x 2.7 cm.  The patient was seen by Dr. Baldwin [name] on January 04, 2024 and he recommended a repeat bronchoscopy to reevaluate the left lung nodule.  The procedure was scheduled for Jan 17, 2024 . 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 He has been referred to both radiation oncology and thoracic surgery for evaluation. There was also a positive culture  of Moraxelle Catarrhalis   02/15/2024 Pt. presents for follow up after treatment with Augmentin  for Moraxella Catarrhalis ( Branhamella) Beta Lactam Positive cultures on BAL collected during bronchoscopy with biopsies. He states he was unable to comlete treatment due to diarrhea despite probiotic twice daily, but took almost the entire prescription.   He states he is doing well. No breathing issues. Lots of energy. He is very active.  He has been to seen by  radiation oncology , and is scheduled to see thoracic surgery in 2 weeks to discuss surgery as an option. Once he sees thoracic surgery he will make his decision regarding treatment. I have encouraged him to make the decision as soon as he can after consult with surgery so as  not to delay treatment.   Test Results: Cytology 01/17/2024 B. LUNG, LUL, FINE NEEDLE ASPIRATION:  -Squamous cell carcinoma   C. LUNG, LUL, BRUSHING:  - Squamous cell carcinoma.  See comment      Latest Ref Rng & Units 01/17/2024    6:50 AM 10/17/2023    8:26 AM 03/31/2023  10:00 AM  CBC  WBC 4.0 - 10.5 K/uL 12.9  9.7  9.5   Hemoglobin 13.0 - 17.0 g/dL 28.4  13.2  44.0   Hematocrit 39.0 - 52.0 % 41.8  43.8  47.3   Platelets 150 - 400 K/uL 235  226  240        Latest Ref Rng & Units 01/17/2024    6:50 AM 10/03/2023    3:07 PM 03/31/2023   10:00 AM  BMP  Glucose 70 - 99 mg/dL 102  70  93   BUN 8 - 23 mg/dL 9  15  14    Creatinine 0.61 - 1.24 mg/dL 7.25  3.66  4.40   BUN/Creat Ratio 10 - 24  15  14     Sodium 135 - 145 mmol/L 137  142  139   Potassium 3.5 - 5.1 mmol/L 4.0  4.4  5.1   Chloride 98 - 111 mmol/L 104  105  103   CO2 22 - 32 mmol/L 26  23  24    Calcium  8.9 - 10.3 mg/dL 9.0  9.2  9.5     BNP No results found for: BNP  ProBNP No results found for: PROBNP  PFT    Component Value Date/Time   FEV1PRE 1.45 09/29/2023 1416   FEV1POST 1.92 09/29/2023 1416   FVCPRE 2.72 09/29/2023 1416   FVCPOST 3.22 09/29/2023 1416   TLC 7.43 09/29/2023 1416   DLCOUNC 17.43 09/29/2023 1416   PREFEV1FVCRT 53 09/29/2023 1416   PSTFEV1FVCRT 60 09/29/2023 1416    ECHOCARDIOGRAM COMPLETE Result Date: 02/06/2024    ECHOCARDIOGRAM REPORT   Patient Name:   Russell [name] Date of Exam: 02/06/2024 Medical Rec #:  [id number]     Height:       70.0 in Accession #:    [id number]    Weight:       141.2 lb Date of Birth:  [birth year]      BSA:          1.800 m Patient Age:    73 years      BP:           118/76 mmHg Patient Gender: M             HR:           76 bpm. Exam Location:  [address] Procedure: 2D Echo, Cardiac Doppler and Color Doppler (Both Spectral and Color            Flow Doppler were utilized during procedure). Indications:    Z01.810 Preoperative Cardiovascular Examination  History:        Patient has no prior history of Echocardiogram examinations.                 Signs/Symptoms:Dyspnea; Risk Factors:Hypertension and                 Dyslipidemia.  Sonographer:    Brigid Canada RDCS Referring Phys: [id number] [facility] J PATWARDHAN IMPRESSIONS  1. Left ventricular ejection fraction, by estimation, is 60 to 65%. The left ventricle has normal function. The left ventricle has no regional wall motion abnormalities. Left ventricular diastolic parameters were normal.  2. Right ventricular systolic function is normal. The right ventricular size is normal. Tricuspid regurgitation signal is inadequate for assessing PA pressure.  3. The mitral valve is normal in structure. Trivial mitral valve  regurgitation. No evidence of mitral stenosis.  4. The aortic valve is tricuspid. Aortic valve regurgitation is trivial. No aortic stenosis  is present.  5. Aortic dilatation noted. There is borderline dilatation of the aortic root, measuring 39 mm.  6. The inferior vena cava is normal in size with greater than 50% respiratory variability, suggesting right atrial pressure of 3 mmHg. Comparison(s): Frequent PVCs noted during the study. FINDINGS  Left Ventricle: Left ventricular ejection fraction, by estimation, is 60 to 65%. The left ventricle has normal function. The left ventricle has no regional wall motion abnormalities. The left ventricular internal cavity size was normal in size. There is  no left ventricular hypertrophy. Left ventricular diastolic parameters were normal. Right Ventricle: The right ventricular size is normal. No increase in right ventricular wall thickness. Right ventricular systolic function is normal. Tricuspid regurgitation signal is inadequate for assessing PA pressure. The tricuspid regurgitant velocity is 2.93 m/s, and with an assumed right atrial pressure of 3 mmHg, the estimated right ventricular systolic pressure is 37.3 mmHg. Left Atrium: Left atrial size was normal in size. Right Atrium: Right atrial size was normal in size. Pericardium: There is no evidence of pericardial effusion. Mitral Valve: The mitral valve is normal in structure. Trivial mitral valve regurgitation. No evidence of mitral valve stenosis. Tricuspid Valve: The tricuspid valve is normal in structure. Tricuspid valve regurgitation is mild . No evidence of tricuspid stenosis. Aortic Valve: The aortic valve is tricuspid. Aortic valve regurgitation is trivial. No aortic stenosis is present. Pulmonic Valve: The pulmonic valve was grossly normal. Pulmonic valve regurgitation is mild. No evidence of pulmonic stenosis. Aorta: The aortic root is normal in size and structure and aortic dilatation noted. There is borderline  dilatation of the aortic root, measuring 39 mm. Venous: The inferior vena cava is normal in size with greater than 50% respiratory variability, suggesting right atrial pressure of 3 mmHg. IAS/Shunts: No atrial level shunt detected by color flow Doppler.  LEFT VENTRICLE PLAX 2D LVIDd:         5.10 cm   Diastology LVIDs:         3.00 cm   LV e' medial:    8.34 cm/s LV PW:         0.90 cm   LV E/e' medial:  11.3 LV IVS:        0.90 cm   LV e' lateral:   10.30 cm/s LVOT diam:     2.20 cm   LV E/e' lateral: 9.2 LV SV:         100 LV SV Index:   56 LVOT Area:     3.80 cm  RIGHT VENTRICLE             IVC RV Basal diam:  4.10 cm     IVC diam: 0.50 cm RV S prime:     23.95 cm/s TAPSE (M-mode): 2.4 cm LEFT ATRIUM             Index        RIGHT ATRIUM           Index LA diam:        3.90 cm 2.17 cm/m   RA Area:     12.30 cm LA Vol (A2C):   69.9 ml 38.83 ml/m  RA Volume:   29.50 ml  16.39 ml/m LA Vol (A4C):   30.4 ml 16.89 ml/m LA Biplane Vol: 49.9 ml 27.72 ml/m  AORTIC VALVE LVOT Vmax:   119.07 cm/s LVOT Vmean:  78.600 cm/s LVOT VTI:    0.264 m  AORTA Ao Root diam: 3.90 cm Ao Asc diam:  3.40 cm MITRAL VALVE               TRICUSPID VALVE MV Area (PHT): 3.15 cm    TR Peak grad:   34.3 mmHg MV Decel Time: 241 msec    TR Vmax:        293.00 cm/s MV E velocity: 94.50 cm/s MV A velocity: 83.45 cm/s  SHUNTS MV E/A ratio:  1.13        Systemic VTI:  0.26 m                            Systemic Diam: 2.20 cm Karyl Paget Croitoru MD Electronically signed by Luana Rumple MD Signature Date/Time: 02/06/2024/4:40:37 PM    Final    MR BRAIN W WO CONTRAST Result Date: 01/27/2024 CLINICAL DATA:  Provided history: Non-small cell lung cancer, staging. Malignant neoplasm of unspecified part of unspecified bronchus or lung. EXAM: MRI HEAD WITHOUT AND WITH CONTRAST TECHNIQUE: Multiplanar, multiecho pulse sequences of the brain and surrounding structures were obtained without and with intravenous contrast. CONTRAST:  6mL GADAVIST  GADOBUTROL  1  MMOL/ML IV SOLN COMPARISON:  6 mL Vueway intravenous contrast. FINDINGS: Brain: Mild generalized cerebral atrophy. Multifocal T2 FLAIR hyperintense signal abnormality within the cerebral white matter, nonspecific but compatible with mild chronic small vessel ischemic disease. No cortical encephalomalacia is identified. There is no acute infarct. No evidence of an intracranial mass. No chronic intracranial blood products. No extra-axial fluid collection. No midline shift. No pathologic intracranial enhancement identified. Vascular: Maintained flow voids within the proximal large arterial vessels. 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 Skull and upper cervical spine: No focal worrisome marrow lesion. Incompletely assessed cervical spondylosis. Sinuses/Orbits: No mass or acute finding within the imaged orbits. Prior bilateral ocular lens replacement. Mild mucosal thickening within the left maxillary sinus. IMPRESSION: 1. No evidence of intracranial metastatic disease. 2. Mild chronic small vessel ischemic changes within the cerebral white matter. 3. Mild generalized cerebral atrophy. Electronically Signed   By: Bascom Lily D.O.   On: 01/27/2024 18:29   DG Chest Port 1 View Result Date: 01/17/2024 CLINICAL DATA:  Status post bronchoscopy. EXAM: PORTABLE CHEST 1 VIEW COMPARISON:  CT chest dated 12/26/2023. Chest radiograph dated 11/01/2023. FINDINGS: The heart size and mediastinal contours are within normal limits. Aortic atherosclerosis. A left upper lobe cavitary mass is again noted with interval placement of fiducial marker at the inferior medial aspect of the lesion. No pneumothorax. The right lung is clear. No pleural effusion. No acute osseous abnormality. IMPRESSION: A left upper lobe cavitary mass is again noted with interval placement of fiducial marker at the inferior medial aspect of the lesion. No pneumothorax. Electronically Signed   By: Mannie Seek  M.D.   On: 01/17/2024 10:16   DG C-ARM BRONCHOSCOPY Result Date: 01/17/2024 C-ARM BRONCHOSCOPY: Fluoroscopy was utilized by the requesting physician.  No radiographic interpretation.     Past medical hx Past Medical History:  Diagnosis Date   Dyspnea    with exertion   GERD (gastroesophageal reflux disease)    History of hiatal hernia 07/22/2006   small - dx by endoscopy   Hx of adenomatous polyp of colon 03/24/2016   Hyperlipidemia    Hypertension    Lung nodule 01/04/2024   left upper lobe   Pneumonia    x 1 as teenager     Social History   Tobacco Use   Smoking status: Former  Current packs/day: 0.00    Average packs/day: 1 pack/day for 45.0 years (45.0 ttl pk-yrs)    Types: Cigarettes    Quit date: 01/14/2024    Years since quitting: 0.0   Smokeless tobacco: Never  Vaping Use   Vaping status: Never Used  Substance Use Topics   Alcohol use: Yes    Alcohol/week: 7.0 standard drinks of alcohol    Types: 7 Shots of liquor per week    Comment: liquor   Drug use: Never    Mr.Russell [name] reports that he quit smoking about 4 weeks ago. His smoking use included cigarettes. He has a 45 pack-year smoking history. He has never used smokeless tobacco. He reports current alcohol use of about 7.0 standard drinks of alcohol per week. He reports that he does not use drugs.  Tobacco Cessation: Current every day smoker    Past surgical hx, Family hx, Social hx all reviewed.  Current Outpatient Medications on File Prior to Visit  Medication Sig   albuterol  (VENTOLIN  HFA) 108 (90 Base) MCG/ACT inhaler Inhale 2 puffs into the lungs every 6 (six) hours as needed for wheezing or shortness of breath.   amLODipine  (NORVASC ) 5 MG tablet TAKE 1 TABLET EVERY DAY FOR BLOOD PRESSURE   aspirin  EC 81 MG tablet Take 81 mg by mouth every other day.    augmented betamethasone  dipropionate (DIPROLENE -AF) 0.05 % cream Apply 1 Application topically 2 (two) times daily as needed (rash). At affected  areas (avoid face and genitals)   cyanocobalamin  (VITAMIN B12) 1000 MCG tablet Take 1,000 mcg by mouth in the morning.   olmesartan  (BENICAR ) 40 MG tablet Take 1 tablet (40 mg total) by mouth daily. For blood pressure   rosuvastatin  (CRESTOR ) 10 MG tablet Take 1 tablet (10 mg total) by mouth every other day.   No current facility-administered medications on file prior to visit.     No Known Allergies  Review Of Systems:  Constitutional:   No  weight loss, night sweats,  Fevers, chills, fatigue, or  lassitude.  HEENT:   No headaches,  Difficulty swallowing,  Tooth/dental problems, or  Sore throat,                No sneezing, itching, ear ache, nasal congestion, post nasal drip,   CV:  No chest pain,  Orthopnea, PND, swelling in lower extremities, anasarca, dizziness, palpitations, syncope.   GI  No heartburn, indigestion, abdominal pain, nausea, vomiting, diarrhea, change in bowel habits, loss of appetite, bloody stools.   Resp: No  shortness of breath with exertion or at rest.  + excess mucus, no productive cough,  No non-productive cough,  + coughing up of scan blood.  No change in color of mucus.  No wheezing.  No chest wall deformity  Skin: no rash or lesions.  GU: no dysuria, change in color of urine, no urgency or frequency.  No flank pain, no hematuria   MS:  No joint pain or swelling.  No decreased range of motion.  No back pain.  Psych:  No change in mood or affect. No depression or anxiety.  No memory loss.   Vital Signs BP 132/68 (BP Location: Left Arm, Patient Position: Sitting, Cuff Size: Normal)   Pulse (!) 50   Ht 5' 9 (1.753 m)   Wt 142 lb 3.2 oz (64.5 kg)   SpO2 97%   BMI 21.00 kg/m    Physical Exam:  General- No distress,  A&Ox3, pleasant  ENT: No sinus tenderness, TM  clear, pale nasal mucosa, no oral exudate,no post nasal drip, no LAN Cardiac: S1, S2, regular rate and rhythm, no murmur Chest: No wheeze/ rales/ dullness; no accessory muscle use, no  nasal flaring, no sternal retractions Abd.: Soft Non-tender, ND, BS +, Body mass index is 21 kg/m.  Ext: No clubbing cyanosis, edema, no obvious deformities Neuro:  normal strength, MAE x 4, A&O x 3 appropriate Skin: No rashes, warm and dry, no obvious skin lesions  Psych: normal mood and behavior   Assessment/Plan Lung cancer Current every day smoker  - Follow up with thoracic surgeon Dr. Luna Salinas on June 23. - Contact Dr. Alana Hoyle office regarding radiation impact on lung function, specifically scarring. - Discuss treatment options with thoracic surgeon to determine optimal course of action. - Do not delay making a decision  Scant Hemoptysis Intermittent blood-tinged sputum likely related to lung cancer. No significant symptom increase. - Monitor for increased volume or frequency of hemoptysis. - Advise to report significant increase in hemoptysis or seek emergency care  Current Every Day smoker  Plan You can receive free nicotine replacement therapy (patches, gum, or mints) by calling 1-800-QUIT NOW. Please call so we can get you on the path to becoming a non-smoker. I know it is hard, but you can do this!  Hypnosis for smoking cessation  [employer] [phone]  Acupuncture for smoking cessation  [employer] [phone]    I spent 25 minutes dedicated to the care of this patient on the date of this encounter to include pre-visit review of records, face-to-face time with the patient discussing conditions above, post visit ordering of testing, clinical documentation with the electronic health record, making appropriate referrals as documented, and communicating necessary information to the patient's healthcare team.    Raejean Bullock, NP 02/15/2024  10:38 AM

## 2024-02-16 LAB — FUNGUS CULTURE WITH STAIN

## 2024-02-16 LAB — FUNGUS CULTURE RESULT

## 2024-02-16 LAB — FUNGAL ORGANISM REFLEX

## 2024-02-28 ENCOUNTER — Other Ambulatory Visit: Payer: Self-pay | Admitting: Thoracic Surgery (Cardiothoracic Vascular Surgery)

## 2024-02-28 ENCOUNTER — Ambulatory Visit
Attending: Thoracic Surgery (Cardiothoracic Vascular Surgery) | Admitting: Thoracic Surgery (Cardiothoracic Vascular Surgery)

## 2024-02-28 VITALS — BP 144/79 | HR 59 | Resp 18 | Ht 69.0 in | Wt 144.0 lb

## 2024-02-28 DIAGNOSIS — R911 Solitary pulmonary nodule: Secondary | ICD-10-CM

## 2024-02-28 NOTE — Progress Notes (Signed)
 PCP is Zollie Lowers, MD Referring Provider is Ruthell Lauraine FALCON, NP  Chief Complaint  Patient presents with   Lung Lesion    Review workup    HPI: [name] is sent for consultation regarding a squamous cell carcinoma of the left upper lobe.  Edis Huish is a 73 year old man with a history of tobacco abuse, COPD, hypertension, hyperlipidemia, coronary disease, hiatal hernia, reflux, and newly diagnosed squamous cell carcinoma of the lung.  Smoked about a pack a day for over 50 years prior to quitting on May 10.  Has been in the low-dose CT for lung cancer screening program several years.  He was noted to have a cystic left upper lobe lesion in 2022.  Over time it has grown larger and increased wall thickening.  Had a PET/CT in September 2024 which showed SUV of 3.5.  A CT in January 2025 showed the nodule measured 2.7 x 3.5 cm, but had increased wall thickness compared to his prior study.  Underwent navigational bronchoscopy which showed atypical cells.  Repeat CT in April showed further increase in wall thickness and increase in size to 3.2 x 4.7 cm.  No mediastinal or hilar adenopathy.  PET/CT showed an increase in SUV to 12.  Underwent robotic bronchoscopy again and biopsy showed squamous cell carcinoma.  Has had a cough with hemoptysis since his biopsy.  Gets short of breath with heavy exertion but can walk a mile without stopping.  Can walk up 2 flights of stairs without stopping.  No chest pain, pressure, or tightness.  He does bruise easily.  No weight loss over the past 3 months.  Appetite is good.  Zubrod Score: At the time of surgery this patient's most appropriate activity status/level should be described as: [x]     0    Normal activity, no symptoms []     1    Restricted in physical strenuous activity but ambulatory, able to do out light work []     2    Ambulatory and capable of self care, unable to do work activities, up and about >50 % of waking hours                              []      3    Only limited self care, in bed greater than 50% of waking hours []     4    Completely disabled, no self care, confined to bed or chair []     5    Moribund  Past Medical History:  Diagnosis Date   Dyspnea    with exertion   GERD (gastroesophageal reflux disease)    History of hiatal hernia 07/22/2006   small - dx by endoscopy   Hx of adenomatous polyp of colon 03/24/2016   Hyperlipidemia    Hypertension    Lung nodule 01/04/2024   left upper lobe   Pneumonia    x 1 as teenager    Past Surgical History:  Procedure Laterality Date   BRONCHIAL BIOPSY  10/17/2023   Procedure: BRONCHIAL BIOPSIES;  Surgeon: Shelah Lamar RAMAN, MD;  Location: [facility] ENDOSCOPY;  Service: Pulmonary;;   BRONCHIAL BIOPSY  01/17/2024   Procedure: BRONCHOSCOPY, WITH BIOPSY;  Surgeon: Shelah Lamar RAMAN, MD;  Location: [facility] ENDOSCOPY;  Service: Pulmonary;;   BRONCHIAL BRUSHINGS  10/17/2023   Procedure: BRONCHIAL BRUSHINGS;  Surgeon: Shelah Lamar RAMAN, MD;  Location: [facility] ENDOSCOPY;  Service: Pulmonary;;   BRONCHIAL BRUSHINGS  01/17/2024   Procedure: BRONCHOSCOPY, WITH BRUSH BIOPSY;  Surgeon: Shelah Lamar RAMAN, MD;  Location: [facility] ENDOSCOPY;  Service: Pulmonary;;   BRONCHIAL NEEDLE ASPIRATION BIOPSY  10/17/2023   Procedure: BRONCHIAL NEEDLE ASPIRATION BIOPSIES;  Surgeon: Shelah Lamar RAMAN, MD;  Location: [facility] ENDOSCOPY;  Service: Pulmonary;;   BRONCHIAL NEEDLE ASPIRATION BIOPSY  01/17/2024   Procedure: BRONCHOSCOPY, WITH NEEDLE ASPIRATION BIOPSY;  Surgeon: Shelah Lamar RAMAN, MD;  Location: [facility] ENDOSCOPY;  Service: Pulmonary;;   BRONCHIAL WASHINGS  10/17/2023   Procedure: BRONCHIAL WASHINGS;  Surgeon: Shelah Lamar RAMAN, MD;  Location: [facility] ENDOSCOPY;  Service: Pulmonary;;   BRONCHIAL WASHINGS  01/17/2024   Procedure: IRRIGATION, BRONCHUS;  Surgeon: Shelah Lamar RAMAN, MD;  Location: [facility] ENDOSCOPY;  Service: Pulmonary;;   COLONOSCOPY  09/07/2003   gessner - hx polyp; colonoscopy x several - last one on 06/04/21   cyst removal from gum      FRACTURE SURGERY Left age 70   Left Knee   FUDUCIAL PLACEMENT  01/17/2024   Procedure: INSERTION, FIDUCIAL MARKER, GOLD;  Surgeon: Shelah Lamar RAMAN, MD;  Location: [facility] ENDOSCOPY;  Service: Pulmonary;;   HERNIA REPAIR     UPPER GASTROINTESTINAL ENDOSCOPY     UPPER GI ENDOSCOPY     Normal   VIDEO BRONCHOSCOPY WITH ENDOBRONCHIAL NAVIGATION Left 01/17/2024   Procedure: VIDEO BRONCHOSCOPY WITH ENDOBRONCHIAL NAVIGATION;  Surgeon: Shelah Lamar RAMAN, MD;  Location: [facility] ENDOSCOPY;  Service: Pulmonary;  Laterality: Left;   WISDOM TOOTH EXTRACTION      Family History  Problem Relation Age of Onset   Heart disease Mother    Diabetes Mother    Hyperlipidemia Father    Colon cancer Neg Hx    Esophageal cancer Neg Hx    Rectal cancer Neg Hx    Stomach cancer Neg Hx    Pancreatic cancer Neg Hx    Prostate cancer Neg Hx    Colon polyps Neg Hx     Social History Social History   Tobacco Use   Smoking status: Former    Current packs/day: 0.00    Average packs/day: 1 pack/day for 45.0 years (45.0 ttl pk-yrs)    Types: Cigarettes    Quit date: 01/14/2024    Years since quitting: 0.1   Smokeless tobacco: Never  Vaping Use   Vaping status: Never Used  Substance Use Topics   Alcohol use: Yes    Alcohol/week: 7.0 standard drinks of alcohol    Types: 7 Shots of liquor per week    Comment: liquor   Drug use: Never    Current Outpatient Medications  Medication Sig Dispense Refill   albuterol  (VENTOLIN  HFA) 108 (90 Base) MCG/ACT inhaler Inhale 2 puffs into the lungs every 6 (six) hours as needed for wheezing or shortness of breath.     amLODipine  (NORVASC ) 5 MG tablet TAKE 1 TABLET EVERY DAY FOR BLOOD PRESSURE 90 tablet 3   aspirin  EC 81 MG tablet Take 81 mg by mouth every other day.      augmented betamethasone  dipropionate (DIPROLENE -AF) 0.05 % cream Apply 1 Application topically 2 (two) times daily as needed (rash). At affected areas (avoid face and genitals)     cyanocobalamin  (VITAMIN B12) 1000  MCG tablet Take 1,000 mcg by mouth in the morning.     olmesartan  (BENICAR ) 40 MG tablet Take 1 tablet (40 mg total) by mouth daily. For blood pressure 90 tablet 3   rosuvastatin  (CRESTOR ) 10 MG tablet Take 1 tablet (10 mg total) by mouth every other day.  90 tablet 1   No current facility-administered medications for this visit.    No Known Allergies  Review of Systems  Eyes:  Negative for visual disturbance.  Respiratory:  Positive for cough.     BP (!) 144/79 (BP Location: Left Arm)   Pulse (!) 59   Resp 18   Ht 5' 9 (1.753 m)   Wt 144 lb (65.3 kg)   SpO2 97%   BMI 21.27 kg/m  Physical Exam Vitals reviewed.  Constitutional:      General: He is not in acute distress.    Appearance: Normal appearance. He is normal weight.  HENT:     Head: Normocephalic and atraumatic.   Eyes:     Extraocular Movements: Extraocular movements intact.    Cardiovascular:     Rate and Rhythm: Normal rate and regular rhythm.     Heart sounds: Normal heart sounds. No murmur heard.    No friction rub. No gallop.  Pulmonary:     Effort: Pulmonary effort is normal. No respiratory distress.     Breath sounds: Normal breath sounds. No wheezing or rales.  Abdominal:     Palpations: Abdomen is soft.   Musculoskeletal:     Cervical back: Neck supple.  Lymphadenopathy:     Cervical: No cervical adenopathy.   Skin:    General: Skin is warm and dry.   Neurological:     General: No focal deficit present.     Mental Status: He is alert and oriented to person, place, and time.     Cranial Nerves: No cranial nerve deficit.     Motor: No weakness.    Diagnostic Tests: CT CHEST WITHOUT CONTRAST   TECHNIQUE: Multidetector CT imaging of the chest was performed following the standard protocol without IV contrast.   RADIATION DOSE REDUCTION: This exam was performed according to the departmental dose-optimization program which includes automated exposure control, adjustment of the mA and/or kV  according to patient size and/or use of iterative reconstruction technique.   COMPARISON:  CT chest 09/16/2023, PET CT 09/09/2023   FINDINGS: Cardiovascular: Normal heart size. No significant pericardial effusion. The thoracic aorta is normal in caliber. Severe atherosclerotic plaque of the thoracic aorta. At least 2 vessel coronary artery calcifications. The main pulmonary artery measures at the upper limits of normal.   Mediastinum/Nodes: No gross hilar adenopathy, noting limited sensitivity for the detection of hilar adenopathy on this noncontrast study. No enlarged mediastinal or axillary lymph nodes. Thyroid  gland, trachea, and esophagus demonstrate no significant findings.   Lungs/Pleura: No focal consolidation. No pulmonary nodule. Interval increase in size of the left upper lobe peripheral 4.7 x 3.2 cm (from 3.5 x 2.7 cm) cavitary mass with surrounding reticulations and ground-glass airspace opacities. No pleural effusion. No pneumothorax.   Upper Abdomen: No acute abnormality.   Musculoskeletal:   No chest wall abnormality.   No suspicious lytic or blastic osseous lesions. No acute displaced fracture.   IMPRESSION: 1. Interval increase in size of the left upper lobe peripheral 4.7 x 3.2 cm (from 3.5 x 2.7 cm) cavitary mass with surrounding reticulations and ground-glass airspace opacities. 2. Aortic Atherosclerosis (ICD10-I70.0).     Electronically Signed   By: Morgane  Naveau M.D.   On: 01/01/2024 01:58 I personally reviewed the CT and PET/CT images.  Compared to previous films.  Hypermetabolic 4.7 x 3.2 cm cavitary mass abutting the chest wall with no definite invasion.  No mediastinal or hilar adenopathy.  No evidence of distant metastatic disease.  Coronary calcification.  Pulmonary function testing FVC 2.72 (65%) FVC 3.22 (76%) postbronchodilator FEV1 1.45 (47%) FEV1 1.92 (62%) postbronchodilator DLCO 17.43 (70%)  Impression: Russell [name] is a  73 year old man with a history of tobacco abuse, COPD, hypertension, hyperlipidemia, coronary disease, hiatal hernia, reflux, and newly diagnosed squamous cell carcinoma of the lung.  Squamous cell carcinoma of the lung-clinical stage T2b, N0, stage IIa.  I had a long discussion with Mr. Mrs. Mader regarding potential treatment option.  Options for local treatment include surgical resection and radiation.  Given the size of the tumor and the staging chemotherapy will likely be recommended as well.  They were quite surprised by that information.  We discussed the relative advantages and disadvantages of surgery and radiation for local therapy.  They understand that surgery is some the best chance of a cure, but there is no guarantee with either treatment.  They do understand that surgery involves more upfront discomfort and risk.  I am concerned there could possibly be some chest wall involvement.  He complains of a pinprick sensation in the anterior left chest.  He really only noticed that since his bronchoscopy although I do not think is related to that at all.  He does not have the type of pain that would suggest intercostal nerve involvement or any evidence of rib destruction but given the amount of tumor in proximity to chest wall would not be surprised if he would require an extrapleural dissection in that area.  There is a small possibility would require conversion to thoracotomy and chest wall resection.  I discussed the proposed operative procedure with Mr. Mrs. Castagna.  The plan would be to do a robotic assisted left upper lobectomy.  Possible conversion to thoracotomy or chest wall resection.  I informed him of the general nature of the procedure including the need for general anesthesia, the incisions to be used, the use of surgical robot, the use of drains to postoperatively, the expected hospital stay, and the overall recovery.  I informed them of the indications, risks, benefits, and  alternatives.  They understand the risks include, but not limited to death, MI, DVT, PE, bleeding, possible need for transfusion, infection, prolonged air leak, cardiac arrhythmias, as well as possibility of other unstable complications.  He understands and accepts the risks.  He wishes to proceed.  They do understand that given the size of the tumor he will be referred to medical oncology and likely the recommendation for adjuvant chemotherapy.  He does have some coronary calcification.  He saw Dr. Elmira.  He was cleared for surgery.  Tobacco abuse-quit smoking about 6 weeks ago.  Plan: Will repeat CT as it has been almost 3 months from his most recent scan Robotic assisted left upper lobectomy, possible thoracotomy, possible chest wall resection on Monday, 03/12/2024  Elspeth JAYSON Millers, MD [facility] [phone]

## 2024-02-28 NOTE — H&P (View-Only) (Signed)
 PCP is Russell Lowers, MD Referring Provider is Russell Lauraine FALCON, NP  Chief Complaint  Patient presents with   Lung Lesion    Review workup    HPI: [name] is sent for consultation regarding a squamous cell carcinoma of the left upper lobe.  Russell [name] is a 73 year old man with a history of tobacco abuse, COPD, hypertension, hyperlipidemia, coronary disease, hiatal hernia, reflux, and newly diagnosed squamous cell carcinoma of the lung.  Smoked about a pack a day for over 50 years prior to quitting on May 10.  Has been in the low-dose CT for lung cancer screening program several years.  Russell [name] was noted to have a cystic left upper lobe lesion in 2022.  Over time it has grown larger and increased wall thickening.  Had a PET/CT in September 2024 which showed SUV of 3.5.  A CT in January 2025 showed the nodule measured 2.7 x 3.5 cm, but had increased wall thickness compared to his prior study.  Underwent navigational bronchoscopy which showed atypical cells.  Repeat CT in April showed further increase in wall thickness and increase in size to 3.2 x 4.7 cm.  No mediastinal or hilar adenopathy.  PET/CT showed an increase in SUV to 12.  Underwent robotic bronchoscopy again and biopsy showed squamous cell carcinoma.  Has had a cough with hemoptysis since his biopsy.  Gets short of breath with heavy exertion but can walk a mile without stopping.  Can walk up 2 flights of stairs without stopping.  No chest pain, pressure, or tightness.  Russell [name] does bruise easily.  No weight loss over the past 3 months.  Appetite is good.  Zubrod Score: At the time of surgery this patient's most appropriate activity status/level should be described as: [x]     0    Normal activity, no symptoms []     1    Restricted in physical strenuous activity but ambulatory, able to do out light work []     2    Ambulatory and capable of self care, unable to do work activities, up and about >50 % of waking hours                              []      3    Only limited self care, in bed greater than 50% of waking hours []     4    Completely disabled, no self care, confined to bed or chair []     5    Moribund  Past Medical History:  Diagnosis Date   Dyspnea    with exertion   GERD (gastroesophageal reflux disease)    History of hiatal hernia 07/22/2006   small - dx by endoscopy   Hx of adenomatous polyp of colon 03/24/2016   Hyperlipidemia    Hypertension    Lung nodule 01/04/2024   left upper lobe   Pneumonia    x 1 as teenager    Past Surgical History:  Procedure Laterality Date   BRONCHIAL BIOPSY  10/17/2023   Procedure: BRONCHIAL BIOPSIES;  Surgeon: Shelah Lamar RAMAN, MD;  Location: [facility] ENDOSCOPY;  Service: Pulmonary;;   BRONCHIAL BIOPSY  01/17/2024   Procedure: BRONCHOSCOPY, WITH BIOPSY;  Surgeon: Shelah Lamar RAMAN, MD;  Location: [facility] ENDOSCOPY;  Service: Pulmonary;;   BRONCHIAL BRUSHINGS  10/17/2023   Procedure: BRONCHIAL BRUSHINGS;  Surgeon: Shelah Lamar RAMAN, MD;  Location: [facility] ENDOSCOPY;  Service: Pulmonary;;   BRONCHIAL BRUSHINGS  01/17/2024   Procedure: BRONCHOSCOPY, WITH BRUSH BIOPSY;  Surgeon: Shelah Lamar RAMAN, MD;  Location[facility] ENDOSCOPY;  Service: Pulmonary;;   BRONCHIAL NEEDLE ASPIRATION BIOPSY  10/17/2023   Procedure: BRONCHIAL NEEDLE ASPIRATION BIOPSIES;  Surgeon: Shelah Lamar RAMAN, MD;  Location: [facility] ENDOSCOPY;  Service: Pulmonary;;   BRONCHIAL NEEDLE ASPIRATION BIOPSY  01/17/2024   Procedure: BRONCHOSCOPY, WITH NEEDLE ASPIRATION BIOPSY;  Surgeon: Shelah Lamar RAMAN, MD;  Location: [facility] ENDOSCOPY;  Service: Pulmonary;;   BRONCHIAL WASHINGS  10/17/2023   Procedure: BRONCHIAL WASHINGS;  Surgeon: Shelah Lamar RAMAN, MD;  Location: [facility] ENDOSCOPY;  Service: Pulmonary;;   BRONCHIAL WASHINGS  01/17/2024   Procedure: IRRIGATION, BRONCHUS;  Surgeon: Shelah Lamar RAMAN, MD;  Location: [facility] ENDOSCOPY;  Service: Pulmonary;;   COLONOSCOPY  09/07/2003   gessner - hx polyp; colonoscopy x several - last one on 06/04/21   cyst removal from gum      FRACTURE SURGERY Left age 70   Left Knee   FUDUCIAL PLACEMENT  01/17/2024   Procedure: INSERTION, FIDUCIAL MARKER, GOLD;  Surgeon: Shelah Lamar RAMAN, MD;  Location: [facility] ENDOSCOPY;  Service: Pulmonary;;   HERNIA REPAIR     UPPER GASTROINTESTINAL ENDOSCOPY     UPPER GI ENDOSCOPY     Normal   VIDEO BRONCHOSCOPY WITH ENDOBRONCHIAL NAVIGATION Left 01/17/2024   Procedure: VIDEO BRONCHOSCOPY WITH ENDOBRONCHIAL NAVIGATION;  Surgeon: Shelah Lamar RAMAN, MD;  Location: [facility] ENDOSCOPY;  Service: Pulmonary;  Laterality: Left;   WISDOM TOOTH EXTRACTION      Family History  Problem Relation Age of Onset   Heart disease Mother    Diabetes Mother    Hyperlipidemia Father    Colon cancer Neg Hx    Esophageal cancer Neg Hx    Rectal cancer Neg Hx    Stomach cancer Neg Hx    Pancreatic cancer Neg Hx    Prostate cancer Neg Hx    Colon polyps Neg Hx     Social History Social History   Tobacco Use   Smoking status: Former    Current packs/day: 0.00    Average packs/day: 1 pack/day for 45.0 years (45.0 ttl pk-yrs)    Types: Cigarettes    Quit date: 01/14/2024    Years since quitting: 0.1   Smokeless tobacco: Never  Vaping Use   Vaping status: Never Used  Substance Use Topics   Alcohol use: Yes    Alcohol/week: 7.0 standard drinks of alcohol    Types: 7 Shots of liquor per week    Comment: liquor   Drug use: Never    Current Outpatient Medications  Medication Sig Dispense Refill   albuterol  (VENTOLIN  HFA) 108 (90 Base) MCG/ACT inhaler Inhale 2 puffs into the lungs every 6 (six) hours as needed for wheezing or shortness of breath.     amLODipine  (NORVASC ) 5 MG tablet TAKE 1 TABLET EVERY DAY FOR BLOOD PRESSURE 90 tablet 3   aspirin  EC 81 MG tablet Take 81 mg by mouth every other day.      augmented betamethasone  dipropionate (DIPROLENE -AF) 0.05 % cream Apply 1 Application topically 2 (two) times daily as needed (rash). At affected areas (avoid face and genitals)     cyanocobalamin  (VITAMIN B12) 1000  MCG tablet Take 1,000 mcg by mouth in the morning.     olmesartan  (BENICAR ) 40 MG tablet Take 1 tablet (40 mg total) by mouth daily. For blood pressure 90 tablet 3   rosuvastatin  (CRESTOR ) 10 MG tablet Take 1 tablet (10 mg total) by mouth every other day.  90 tablet 1   No current facility-administered medications for this visit.    No Known Allergies  Review of Systems  Eyes:  Negative for visual disturbance.  Respiratory:  Positive for cough.     BP (!) 144/79 (BP Location: Left Arm)   Pulse (!) 59   Resp 18   Ht 5' 9 (1.753 m)   Wt 144 lb (65.3 kg)   SpO2 97%   BMI 21.27 kg/m  Physical Exam Vitals reviewed.  Constitutional:      General: Russell [name] is not in acute distress.    Appearance: Normal appearance. Russell [name] is normal weight.  HENT:     Head: Normocephalic and atraumatic.   Eyes:     Extraocular Movements: Extraocular movements intact.    Cardiovascular:     Rate and Rhythm: Normal rate and regular rhythm.     Heart sounds: Normal heart sounds. No murmur heard.    No friction rub. No gallop.  Pulmonary:     Effort: Pulmonary effort is normal. No respiratory distress.     Breath sounds: Normal breath sounds. No wheezing or rales.  Abdominal:     Palpations: Abdomen is soft.   Musculoskeletal:     Cervical back: Neck supple.  Lymphadenopathy:     Cervical: No cervical adenopathy.   Skin:    General: Skin is warm and dry.   Neurological:     General: No focal deficit present.     Mental Status: Russell [name] is alert and oriented to person, place, and time.     Cranial Nerves: No cranial nerve deficit.     Motor: No weakness.    Diagnostic Tests: CT CHEST WITHOUT CONTRAST   TECHNIQUE: Multidetector CT imaging of the chest was performed following the standard protocol without IV contrast.   RADIATION DOSE REDUCTION: This exam was performed according to the departmental dose-optimization program which includes automated exposure control, adjustment of the mA and/or kV  according to patient size and/or use of iterative reconstruction technique.   COMPARISON:  CT chest 09/16/2023, PET CT 09/09/2023   FINDINGS: Cardiovascular: Normal heart size. No significant pericardial effusion. The thoracic aorta is normal in caliber. Severe atherosclerotic plaque of the thoracic aorta. At least 2 vessel coronary artery calcifications. The main pulmonary artery measures at the upper limits of normal.   Mediastinum/Nodes: No gross hilar adenopathy, noting limited sensitivity for the detection of hilar adenopathy on this noncontrast study. No enlarged mediastinal or axillary lymph nodes. Thyroid  gland, trachea, and esophagus demonstrate no significant findings.   Lungs/Pleura: No focal consolidation. No pulmonary nodule. Interval increase in size of the left upper lobe peripheral 4.7 x 3.2 cm (from 3.5 x 2.7 cm) cavitary mass with surrounding reticulations and ground-glass airspace opacities. No pleural effusion. No pneumothorax.   Upper Abdomen: No acute abnormality.   Musculoskeletal:   No chest wall abnormality.   No suspicious lytic or blastic osseous lesions. No acute displaced fracture.   IMPRESSION: 1. Interval increase in size of the left upper lobe peripheral 4.7 x 3.2 cm (from 3.5 x 2.7 cm) cavitary mass with surrounding reticulations and ground-glass airspace opacities. 2. Aortic Atherosclerosis (ICD10-I70.0).     Electronically Signed   By: Morgane  Naveau M.D.   On: 01/01/2024 01:58 I personally reviewed the CT and PET/CT images.  Compared to previous films.  Hypermetabolic 4.7 x 3.2 cm cavitary mass abutting the chest wall with no definite invasion.  No mediastinal or hilar adenopathy.  No evidence of distant metastatic disease.  Coronary calcification.  Pulmonary function testing FVC 2.72 (65%) FVC 3.22 (76%) postbronchodilator FEV1 1.45 (47%) FEV1 1.92 (62%) postbronchodilator DLCO 17.43 (70%)  Impression: Russell [name] is a  73 year old man with a history of tobacco abuse, COPD, hypertension, hyperlipidemia, coronary disease, hiatal hernia, reflux, and newly diagnosed squamous cell carcinoma of the lung.  Squamous cell carcinoma of the lung-clinical stage T2b, N0, stage IIa.  I had a long discussion with Russell [name] regarding potential treatment option.  Options for local treatment include surgical resection and radiation.  Given the size of the tumor and the staging chemotherapy will likely be recommended as well.  Russell [name] were quite surprised by that information.  We discussed the relative advantages and disadvantages of surgery and radiation for local therapy.  Russell [name] understand that surgery is some the best chance of a cure, but there is no guarantee with either treatment.  They do understand that surgery involves more upfront discomfort and risk.  I am concerned there could possibly be some chest wall involvement.  Russell [name] complains of a pinprick sensation in the anterior left chest.  Russell [name] really only noticed that since his bronchoscopy although I do not think is related to that at all.  Russell [name] does not have the type of pain that would suggest intercostal nerve involvement or any evidence of rib destruction but given the amount of tumor in proximity to chest wall would not be surprised if Russell [name] would require an extrapleural dissection in that area.  There is a small possibility would require conversion to thoracotomy and chest wall resection.  I discussed the proposed operative procedure with Russell [name].  The plan would be to do a robotic assisted left upper lobectomy.  Possible conversion to thoracotomy or chest wall resection.  I informed him of the general nature of the procedure including the need for general anesthesia, the incisions to be used, the use of surgical robot, the use of drains to postoperatively, the expected hospital stay, and the overall recovery.  I informed them of the indications, risks, benefits, and  alternatives.  Russell [name] understand the risks include, but not limited to death, MI, DVT, PE, bleeding, possible need for transfusion, infection, prolonged air leak, cardiac arrhythmias, as well as possibility of other unstable complications.  Russell [name] understands and accepts the risks.  Russell [name] wishes to proceed.  They do understand that given the size of the tumor Russell [name] will be referred to medical oncology and likely the recommendation for adjuvant chemotherapy.  Russell [name] does have some coronary calcification.  Russell [name] saw Dr. Elmira.  Russell [name] was cleared for surgery.  Tobacco abuse-quit smoking about 6 weeks ago.  Plan: Will repeat CT as it has been almost 3 months from his most recent scan Robotic assisted left upper lobectomy, possible thoracotomy, possible chest wall resection on Monday, 03/12/2024  Elspeth JAYSON Millers, MD [facility] [phone]

## 2024-02-29 ENCOUNTER — Other Ambulatory Visit: Payer: Self-pay | Admitting: *Deleted

## 2024-02-29 ENCOUNTER — Encounter: Payer: Self-pay | Admitting: *Deleted

## 2024-02-29 ENCOUNTER — Ambulatory Visit (HOSPITAL_COMMUNITY)
Admission: RE | Admit: 2024-02-29 | Discharge: 2024-02-29 | Disposition: A | Discharge status: Home / Self Care | Source: Ambulatory Visit | Attending: Thoracic Surgery (Cardiothoracic Vascular Surgery) | Admitting: Thoracic Surgery (Cardiothoracic Vascular Surgery)

## 2024-02-29 DIAGNOSIS — R911 Solitary pulmonary nodule: Secondary | ICD-10-CM | POA: Diagnosis not present

## 2024-02-29 DIAGNOSIS — J432 Centrilobular emphysema: Secondary | ICD-10-CM | POA: Diagnosis not present

## 2024-02-29 DIAGNOSIS — I700 Atherosclerosis of aorta: Secondary | ICD-10-CM | POA: Diagnosis not present

## 2024-02-29 DIAGNOSIS — C3412 Malignant neoplasm of upper lobe, left bronchus or lung: Secondary | ICD-10-CM | POA: Diagnosis not present

## 2024-02-29 LAB — ACID FAST CULTURE WITH REFLEXED SENSITIVITIES (MYCOBACTERIA)
Acid Fast Culture: NEGATIVE
Acid Fast Culture: NEGATIVE

## 2024-03-01 ENCOUNTER — Encounter: Payer: Self-pay | Admitting: *Deleted

## 2024-03-01 ENCOUNTER — Other Ambulatory Visit (HOSPITAL_COMMUNITY)

## 2024-03-07 NOTE — Pre-Procedure Instructions (Signed)
 Surgical Instructions   Your procedure is scheduled on March 12, 2024. Report to [facility] Main Entrance A at 10:45 A.M., then check in with the Admitting office. Any questions or running late day of surgery: call [phone]  Questions prior to your surgery date: call [phone], Monday-Friday, 8am-4pm. If you experience any cold or flu symptoms such as cough, fever, chills, shortness of breath, etc. between now and your scheduled surgery, please notify us  at the above number.     Remember:  Do not eat or drink after midnight the night before your surgery   Take these medicines the morning of surgery with A SIP OF WATER: amLODipine  (NORVASC )  rosuvastatin  (CRESTOR )    Continue taking your Aspirin  through the day before surgery. DO NOT take any the morning of surgery.   One week prior to surgery, STOP taking any Aleve, Naproxen, Ibuprofen, Motrin, Advil, Goody's, BC's, all herbal medications, fish oil, and non-prescription vitamins.                     Do NOT Smoke (Tobacco/Vaping) for 24 hours prior to your procedure.  If you use a CPAP at night, you may bring your mask/headgear for your overnight stay.   You will be asked to remove any contacts, glasses, piercing's, hearing aid's, dentures/partials prior to surgery. Please bring cases for these items if needed.    Patients discharged the day of surgery will not be allowed to drive home, and someone needs to stay with them for 24 hours.  SURGICAL WAITING ROOM VISITATION Patients may have no more than 2 support people in the waiting area - these visitors may rotate.   Pre-op nurse will coordinate an appropriate time for 1 ADULT support person, who may not rotate, to accompany patient in pre-op.  Children under the age of 18 must have an adult with them who is not the patient and must remain in the main waiting area with an adult.  If the patient needs to stay at the hospital during part of their recovery, the visitor  guidelines for inpatient rooms apply.  Please refer to the [facility] website for the visitor guidelines for any additional information.   If you received a COVID test during your pre-op visit  it is requested that you wear a mask when out in public, stay away from anyone that may not be feeling well and notify your surgeon if you develop symptoms. If you have been in contact with anyone that has tested positive in the last 10 days please notify you surgeon.      Pre-operative CHG Bathing Instructions   You can play a key role in reducing the risk of infection after surgery. Your skin needs to be as free of germs as possible. You can reduce the number of germs on your skin by washing with CHG (chlorhexidine  gluconate) soap before surgery. CHG is an antiseptic soap that kills germs and continues to kill germs even after washing.   DO NOT use if you have an allergy  to chlorhexidine /CHG or antibacterial soaps. If your skin becomes reddened or irritated, stop using the CHG and notify one of our RNs at [phone].              TAKE A SHOWER THE NIGHT BEFORE SURGERY AND THE DAY OF SURGERY    Please keep in mind the following:  DO NOT shave, including legs and underarms, 48 hours prior to surgery.   You may shave your face before/day  of surgery.  Place clean sheets on your bed the night before surgery Use a clean washcloth (not used since being washed) for each shower. DO NOT sleep with pet's night before surgery.  CHG Shower Instructions:  Wash your face and private area with normal soap. If you choose to wash your hair, wash first with your normal shampoo.  After you use shampoo/soap, rinse your hair and body thoroughly to remove shampoo/soap residue.  Turn the water OFF and apply half the bottle of CHG soap to a CLEAN washcloth.  Apply CHG soap ONLY FROM YOUR NECK DOWN TO YOUR TOES (washing for 3-5 minutes)  DO NOT use CHG soap on face, private areas, open wounds, or sores.  Pay special  attention to the area where your surgery is being performed.  If you are having back surgery, having someone wash your back for you may be helpful. Wait 2 minutes after CHG soap is applied, then you may rinse off the CHG soap.  Pat dry with a clean towel  Put on clean pajamas    Additional instructions for the day of surgery: DO NOT APPLY any lotions, deodorants, cologne, or perfumes.   Do not wear jewelry or makeup Do not wear nail polish, gel polish, artificial nails, or any other type of covering on natural nails (fingers and toes) Do not bring valuables to the hospital. [facility] is not responsible for valuables/personal belongings. Put on clean/comfortable clothes.  Please brush your teeth.  Ask your nurse before applying any prescription medications to the skin.

## 2024-03-08 ENCOUNTER — Encounter (HOSPITAL_COMMUNITY): Payer: Self-pay

## 2024-03-08 ENCOUNTER — Other Ambulatory Visit: Payer: Self-pay

## 2024-03-08 ENCOUNTER — Encounter (HOSPITAL_COMMUNITY)
Admission: RE | Admit: 2024-03-08 | Discharge: 2024-03-08 | Disposition: A | Discharge status: Home / Self Care | Source: Ambulatory Visit | Attending: Thoracic Surgery (Cardiothoracic Vascular Surgery) | Admitting: Thoracic Surgery (Cardiothoracic Vascular Surgery)

## 2024-03-08 ENCOUNTER — Ambulatory Visit (HOSPITAL_COMMUNITY)
Admission: RE | Admit: 2024-03-08 | Discharge: 2024-03-08 | Disposition: A | Discharge status: Home / Self Care | Source: Ambulatory Visit | Attending: Thoracic Surgery (Cardiothoracic Vascular Surgery) | Admitting: Thoracic Surgery (Cardiothoracic Vascular Surgery)

## 2024-03-08 VITALS — BP 128/64 | HR 57 | Temp 98.4°F | Resp 18 | Ht 68.0 in | Wt 144.0 lb

## 2024-03-08 DIAGNOSIS — R918 Other nonspecific abnormal finding of lung field: Secondary | ICD-10-CM | POA: Diagnosis not present

## 2024-03-08 DIAGNOSIS — I10 Essential (primary) hypertension: Secondary | ICD-10-CM | POA: Diagnosis not present

## 2024-03-08 DIAGNOSIS — R001 Bradycardia, unspecified: Secondary | ICD-10-CM | POA: Diagnosis not present

## 2024-03-08 DIAGNOSIS — I493 Ventricular premature depolarization: Secondary | ICD-10-CM | POA: Diagnosis not present

## 2024-03-08 DIAGNOSIS — R0609 Other forms of dyspnea: Secondary | ICD-10-CM | POA: Insufficient documentation

## 2024-03-08 DIAGNOSIS — I6782 Cerebral ischemia: Secondary | ICD-10-CM | POA: Diagnosis not present

## 2024-03-08 DIAGNOSIS — C3492 Malignant neoplasm of unspecified part of left bronchus or lung: Secondary | ICD-10-CM | POA: Diagnosis not present

## 2024-03-08 DIAGNOSIS — R911 Solitary pulmonary nodule: Secondary | ICD-10-CM | POA: Diagnosis not present

## 2024-03-08 DIAGNOSIS — Z01818 Encounter for other preprocedural examination: Secondary | ICD-10-CM | POA: Diagnosis not present

## 2024-03-08 DIAGNOSIS — I700 Atherosclerosis of aorta: Secondary | ICD-10-CM | POA: Insufficient documentation

## 2024-03-08 DIAGNOSIS — I2510 Atherosclerotic heart disease of native coronary artery without angina pectoris: Secondary | ICD-10-CM | POA: Insufficient documentation

## 2024-03-08 LAB — COMPREHENSIVE METABOLIC PANEL WITH GFR
ALT: 15 U/L (ref 0–44)
AST: 18 U/L (ref 15–41)
Albumin: 3.6 g/dL (ref 3.5–5.0)
Alkaline Phosphatase: 58 U/L (ref 38–126)
Anion gap: 10 (ref 5–15)
BUN: 12 mg/dL (ref 8–23)
CO2: 26 mmol/L (ref 22–32)
Calcium: 9.1 mg/dL (ref 8.9–10.3)
Chloride: 104 mmol/L (ref 98–111)
Creatinine, Ser: 0.83 mg/dL (ref 0.61–1.24)
GFR, Estimated: >60 mL/min (ref >60)
Glucose, Bld: 95 mg/dL (ref 70–99)
Potassium: 4.5 mmol/L (ref 3.5–5.1)
Sodium: 140 mmol/L (ref 135–145)
Total Bilirubin: 0.8 mg/dL (ref 0.0–1.2)
Total Protein: 6.4 g/dL — ABNORMAL LOW (ref 6.5–8.1)

## 2024-03-08 LAB — URINALYSIS, ROUTINE W REFLEX MICROSCOPIC
Bilirubin Urine: NEGATIVE
Glucose, UA: NEGATIVE mg/dL
Hgb urine dipstick: NEGATIVE
Ketones, ur: NEGATIVE mg/dL
Leukocytes,Ua: NEGATIVE
Nitrite: NEGATIVE
Protein, ur: NEGATIVE mg/dL
Specific Gravity, Urine: 1.011 (ref 1.005–1.030)
pH: 7 (ref 5.0–8.0)

## 2024-03-08 LAB — CBC
HCT: 39.5 % (ref 39.0–52.0)
Hemoglobin: 12.9 g/dL — ABNORMAL LOW (ref 13.0–17.0)
MCH: 31.4 pg (ref 26.0–34.0)
MCHC: 32.7 g/dL (ref 30.0–36.0)
MCV: 96.1 fL (ref 80.0–100.0)
Platelets: 282 10*3/uL (ref 150–400)
RBC: 4.11 MIL/uL — ABNORMAL LOW (ref 4.22–5.81)
RDW: 13.2 % (ref 11.5–15.5)
WBC: 11.2 10*3/uL — ABNORMAL HIGH (ref 4.0–10.5)
nRBC: 0 % (ref 0.0–0.2)

## 2024-03-08 LAB — SURGICAL PCR SCREEN
MRSA, PCR: NEGATIVE
Staphylococcus aureus: NEGATIVE

## 2024-03-08 LAB — PROTIME-INR
INR: 1 (ref 0.8–1.2)
Prothrombin Time: 13.7 s (ref 11.4–15.2)

## 2024-03-08 LAB — APTT: aPTT: 30 s (ref 24–36)

## 2024-03-08 NOTE — Progress Notes (Signed)
 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  History includes former smoker (quit 01/14/24), HTN, HLD, GERD, hiatal hernia, exertional dyspnea, LUL squamous cell carcinoma (01/17/24).   He had preoperative cardiology evaluation by Dr. Elmira with echo. Coronary calcifications on CT imaging. Good functional status. On 02/06/24, he wrote, Frequent PVCs noted during echocardiogram.  Upper limit normal of aortic root, likely of no clinical significance. Overall, reassuring echocardiogram. Okay to proceed with upcoming workup and management for probable lung cancer. Advised he could hold ASA up to 5 days for surgery if needed.   Anesthesia team to evaluate on the day of surgery.   VS: BP 128/64   Pulse (!) 57   Temp 36.9 C   Resp 18   Ht 5' 8 (1.727 m)   Wt 65.3 kg   SpO2 98%   BMI 21.90 kg/m   PROVIDERS: Zollie Lowers, MD is PCP  Elmira Penman, MD is cardiologist    LABS: Labs reviewed: Acceptable for surgery. (all labs ordered are listed, but only abnormal results are displayed)  Labs Reviewed  CBC - Abnormal; Notable for the following components:      Result Value   WBC 11.2 (*)    RBC 4.11 (*)    Hemoglobin 12.9 (*)    All other components within normal limits  COMPREHENSIVE METABOLIC PANEL WITH GFR - Abnormal; Notable for the following components:   Total Protein 6.4 (*)    All other components within  normal limits  SURGICAL PCR SCREEN  PROTIME-INR  APTT  URINALYSIS, ROUTINE W REFLEX MICROSCOPIC  TYPE AND SCREEN    PFTs 09/29/23: FVC 2.72 (65%) post 3.22 (76%). FEV1 1.45 (47%), post 1.92 (62%). DLCO ucn/cor 17.42 (70%).  IMAGES: CXR 03/08/24: IMPRESSION: 1. Mildly increasing 5.5 cm cavitary mass in the posterior left upper lobe with internal lucency, likely reflecting increased fluid. Primary bronchogenic malignancy to be excluded. 2. Hyperinflated lungs suggesting COPD.  MRI Brain 01/27/24: IMPRESSION: 1. No evidence of intracranial metastatic disease. 2. Mild chronic small vessel ischemic changes within the cerebral white matter. 3. Mild generalized cerebral atrophy.  PET Scan 09/09/23: IMPRESSION: 1. The 3.4 cm cavitary mass in the left upper lobe has a thin medial wall but a thick wall lateral wall, maximum SUV especially along the lateral wall at 14.4 (previously 3.5). Possibilities include cavitary malignancy and active granulomatous process such as fungal disease. 2. No findings of metastatic disease. 3. Healing fracture of the right anterior sixth rib. 4. Chronic left frontal sinusitis. 5. Airway thickening is present, suggesting bronchitis or reactive airways disease. 6. Coronary, aortic arch, and branch vessel atherosclerotic vascular disease. - Aortic Atherosclerosis (ICD10-I70.0).     EKG: 03/08/24: Sinus bradycardia at 52 bpm Possible Anterior infarct , age undetermined  CV: Echo 02/06/24: IMPRESSIONS   1. Left ventricular ejection fraction, by estimation, is 60 to 65%. The  left ventricle has normal function. The left  ventricle has no regional  wall motion abnormalities. Left ventricular diastolic parameters were  normal.   2. Right ventricular systolic function is normal. The right ventricular  size is normal. Tricuspid regurgitation signal is inadequate for assessing  PA pressure.   3. The mitral valve is normal in structure. Trivial mitral valve   regurgitation. No evidence of mitral stenosis.   4. The aortic valve is tricuspid. Aortic valve regurgitation is trivial.  No aortic stenosis is present.   5. Aortic dilatation noted. There is borderline dilatation of the aortic  root, measuring 39 mm.   6. The inferior vena cava is normal in size with greater than 50%  respiratory variability, suggesting right atrial pressure of 3 mmHg.  - Comparison(s): Frequent PVCs noted during the study.   US  Carotid 12/26/07: 1.  Bilateral carotid bifurcation plaque, left greater than right,  without evidence of hemodynamically significant stenosis.  Continued surveillance recommended.   Past Medical History:  Diagnosis Date   Dyspnea    with exertion   GERD (gastroesophageal reflux disease)    History of hiatal hernia 07/22/2006   small - dx by endoscopy   Hx of adenomatous polyp of colon 03/24/2016   Hyperlipidemia    Hypertension    Lung nodule 01/04/2024   left upper lobe   Pneumonia    x 1 as teenager    Past Surgical History:  Procedure Laterality Date   BRONCHIAL BIOPSY  10/17/2023   Procedure: BRONCHIAL BIOPSIES;  Surgeon: Shelah Lamar RAMAN, MD;  Location: [facility] ENDOSCOPY;  Service: Pulmonary;;   BRONCHIAL BIOPSY  01/17/2024   Procedure: BRONCHOSCOPY, WITH BIOPSY;  Surgeon: Shelah Lamar RAMAN, MD;  Location: [facility] ENDOSCOPY;  Service: Pulmonary;;   BRONCHIAL BRUSHINGS  10/17/2023   Procedure: BRONCHIAL BRUSHINGS;  Surgeon: Shelah Lamar RAMAN, MD;  Location: [facility] ENDOSCOPY;  Service: Pulmonary;;   BRONCHIAL BRUSHINGS  01/17/2024   Procedure: BRONCHOSCOPY, WITH BRUSH BIOPSY;  Surgeon: Shelah Lamar RAMAN, MD;  Location: [facility] ENDOSCOPY;  Service: Pulmonary;;   BRONCHIAL NEEDLE ASPIRATION BIOPSY  10/17/2023   Procedure: BRONCHIAL NEEDLE ASPIRATION BIOPSIES;  Surgeon: Shelah Lamar RAMAN, MD;  Location: [facility] ENDOSCOPY;  Service: Pulmonary;;   BRONCHIAL NEEDLE ASPIRATION BIOPSY  01/17/2024   Procedure: BRONCHOSCOPY, WITH NEEDLE ASPIRATION BIOPSY;  Surgeon:  Shelah Lamar RAMAN, MD;  Location: [facility] ENDOSCOPY;  Service: Pulmonary;;   BRONCHIAL WASHINGS  10/17/2023   Procedure: BRONCHIAL WASHINGS;  Surgeon: Shelah Lamar RAMAN, MD;  Location[facility] ENDOSCOPY;  Service: Pulmonary;;   BRONCHIAL WASHINGS  01/17/2024   Procedure: IRRIGATION, BRONCHUS;  Surgeon: Shelah Lamar RAMAN, MD;  Location: [facility] ENDOSCOPY;  Service: Pulmonary;;   COLONOSCOPY  09/07/2003   gessner - hx polyp; colonoscopy x several - last one on 06/04/21   cyst removal from gum     FRACTURE SURGERY Left age 65   Left Knee   FUDUCIAL PLACEMENT  01/17/2024   Procedure: INSERTION, FIDUCIAL MARKER, GOLD;  Surgeon: Shelah Lamar RAMAN, MD;  Location: [facility] ENDOS[facility];  Service: Pulmonary;;   HERNIA REPAIR     UPPER GASTROINTESTINAL ENDOSCOPY     UPPER GI ENDOSCOPY     Normal   VIDEO BRONCHOSCOPY WITH ENDOBRONCHIAL NAVIGATION Left 01/17/2024   Procedure: VIDEO BRONCHOSCOPY WITH ENDOBRONCHIAL NAVIGATION;  Surgeon: Shelah Lamar RAMAN, MD;  Location: [facility] ENDOSCOPY;  Service: Pulmonary;  Laterality: Left;   WISDOM TOOTH EXTRACTION      MEDICATIONS:  ascorbic acid (VITAMIN C) 500 MG tablet   amLODipine  (NORVASC ) 5 MG tablet   aspirin  EC 81 MG  tablet   Cholecalciferol (VITAMIN D3) 125 MCG (5000 UT) CAPS   cyanocobalamin  (VITAMIN B12) 1000 MCG tablet   olmesartan  (BENICAR ) 40 MG tablet   rosuvastatin  (CRESTOR ) 10 MG tablet   No current facility-administered medications for this encounter.    Isaiah Ruder, PA-C Surgical Short Stay/Anesthesiology [facility] Phone [phone] [facility] Phone [phone] 03/08/2024 3:18 PM

## 2024-03-08 NOTE — Anesthesia Preprocedure Evaluation (Addendum)
 Anesthesia Evaluation  Patient identified by MRN, date of birth, ID band Patient awake    Reviewed: Allergy  & Precautions, H&P , NPO status , Patient's Chart, lab work & pertinent test results  Airway Mallampati: III  TM Distance: >3 FB Neck ROM: Full  Mouth opening: Limited Mouth Opening  Dental no notable dental hx. (+) Teeth Intact, Dental Advisory Given   Pulmonary COPD, Patient abstained from smoking., former smoker   Pulmonary exam normal breath sounds clear to auscultation       Cardiovascular hypertension, Pt. on medications  Rhythm:Regular Rate:Normal  Echo 02/06/24: IMPRESSIONS   1. Left ventricular ejection fraction, by estimation, is 60 to 65%. The  left ventricle has normal function. The left ventricle has no regional  wall motion abnormalities. Left ventricular diastolic parameters were  normal.   2. Right ventricular systolic function is normal. The right ventricular  size is normal. Tricuspid regurgitation signal is inadequate for assessing  PA pressure.   3. The mitral valve is normal in structure. Trivial mitral valve  regurgitation. No evidence of mitral stenosis.   4. The aortic valve is tricuspid. Aortic valve regurgitation is trivial.  No aortic stenosis is present.   5. Aortic dilatation noted. There is borderline dilatation of the aortic  root, measuring 39 mm.   6. The inferior vena cava is normal in size with greater than 50%  respiratory variability, suggesting right atrial pressure of 3 mmHg.  - Comparison(s): Frequent PVCs noted during the study    Neuro/Psych negative neurological ROS  negative psych ROS   GI/Hepatic Neg liver ROS, hiatal hernia,GERD  Medicated,,  Endo/Other  negative endocrine ROS    Renal/GU negative Renal ROS  negative genitourinary   Musculoskeletal  (+) Arthritis , Osteoarthritis,    Abdominal   Peds  Hematology negative hematology ROS (+)   Anesthesia Other  Findings   Reproductive/Obstetrics negative OB ROS                              Anesthesia Physical Anesthesia Plan  ASA: 3  Anesthesia Plan: General   Post-op Pain Management: Tylenol  PO (pre-op)*   Induction: Intravenous  PONV Risk Score and Plan: 3 and Dexamethasone  and Treatment may vary due to age or medical condition  Airway Management Planned: Double Lumen EBT  Additional Equipment: Arterial line and CVP  Intra-op Plan:   Post-operative Plan: Extubation in OR and Possible Post-op intubation/ventilation  Informed Consent: I have reviewed the patients History and Physical, chart, labs and discussed the procedure including the risks, benefits and alternatives for the proposed anesthesia with the patient or authorized representative who has indicated his/her understanding and acceptance.     Dental advisory given  Plan Discussed with: CRNA  Anesthesia Plan Comments: (PAT note written 03/08/2024 by Allison Zelenak, PA-C.  )         Anesthesia Quick Evaluation

## 2024-03-08 NOTE — Progress Notes (Signed)
 PCP - Dr. Butler Der Cardiologist - Dr. Newman Lawrence  PPM/ICD - denies Device Orders - na Rep Notified - na  Chest x-ray - PAT, 03/08/2024 EKG - PAT, 03/08/2024 Stress Test - 20 years ago in [place] ECHO - 02/06/2024 Cardiac Cath -   Sleep Study - denies CPAP - na  Non-diabetic  Blood Thinner Instructions:denies Aspirin  Instructions:continue, do not take the day of surgery  ERAS Protcol - NPO  Anesthesia review: Yes. HTN, SOB, HLD, heart clearance  Patient denies shortness of breath, fever, cough and chest pain at PAT appointment   All instructions explained to the patient, with a verbal understanding of the material. Patient agrees to go over the instructions while at home for a better understanding. Patient also instructed to self quarantine after being tested for COVID-19. The opportunity to ask questions was provided.

## 2024-03-12 ENCOUNTER — Inpatient Hospital Stay (HOSPITAL_COMMUNITY)

## 2024-03-12 ENCOUNTER — Encounter (HOSPITAL_COMMUNITY)
Admission: RE | Disposition: A | Discharge status: Home Health | Payer: Self-pay | Source: Home / Self Care | Attending: Thoracic Surgery (Cardiothoracic Vascular Surgery)

## 2024-03-12 ENCOUNTER — Inpatient Hospital Stay (HOSPITAL_COMMUNITY): Payer: Self-pay | Admitting: Anesthesiology

## 2024-03-12 ENCOUNTER — Other Ambulatory Visit: Payer: Self-pay

## 2024-03-12 ENCOUNTER — Inpatient Hospital Stay (HOSPITAL_COMMUNITY)
Admission: RE | Admit: 2024-03-12 | Discharge: 2024-03-19 | DRG: 164 | Disposition: A | Discharge status: Home Health | Attending: Thoracic Surgery (Cardiothoracic Vascular Surgery) | Admitting: Thoracic Surgery (Cardiothoracic Vascular Surgery)

## 2024-03-12 ENCOUNTER — Encounter (HOSPITAL_COMMUNITY): Payer: Self-pay | Admitting: Thoracic Surgery (Cardiothoracic Vascular Surgery)

## 2024-03-12 DIAGNOSIS — C3492 Malignant neoplasm of unspecified part of left bronchus or lung: Secondary | ICD-10-CM

## 2024-03-12 DIAGNOSIS — Z7982 Long term (current) use of aspirin: Secondary | ICD-10-CM | POA: Diagnosis not present

## 2024-03-12 DIAGNOSIS — J449 Chronic obstructive pulmonary disease, unspecified: Secondary | ICD-10-CM

## 2024-03-12 DIAGNOSIS — Z4682 Encounter for fitting and adjustment of non-vascular catheter: Secondary | ICD-10-CM | POA: Diagnosis not present

## 2024-03-12 DIAGNOSIS — Z860101 Personal history of adenomatous and serrated colon polyps: Secondary | ICD-10-CM

## 2024-03-12 DIAGNOSIS — Z79899 Other long term (current) drug therapy: Secondary | ICD-10-CM | POA: Diagnosis not present

## 2024-03-12 DIAGNOSIS — J9811 Atelectasis: Secondary | ICD-10-CM | POA: Diagnosis not present

## 2024-03-12 DIAGNOSIS — Z902 Acquired absence of lung [part of]: Principal | ICD-10-CM

## 2024-03-12 DIAGNOSIS — C3402 Malignant neoplasm of left main bronchus: Secondary | ICD-10-CM

## 2024-03-12 DIAGNOSIS — R918 Other nonspecific abnormal finding of lung field: Secondary | ICD-10-CM | POA: Diagnosis not present

## 2024-03-12 DIAGNOSIS — Z833 Family history of diabetes mellitus: Secondary | ICD-10-CM

## 2024-03-12 DIAGNOSIS — J95812 Postprocedural air leak: Secondary | ICD-10-CM | POA: Diagnosis not present

## 2024-03-12 DIAGNOSIS — Z87891 Personal history of nicotine dependence: Secondary | ICD-10-CM | POA: Diagnosis not present

## 2024-03-12 DIAGNOSIS — J982 Interstitial emphysema: Secondary | ICD-10-CM | POA: Diagnosis not present

## 2024-03-12 DIAGNOSIS — Z83438 Family history of other disorder of lipoprotein metabolism and other lipidemia: Secondary | ICD-10-CM | POA: Diagnosis not present

## 2024-03-12 DIAGNOSIS — R911 Solitary pulmonary nodule: Secondary | ICD-10-CM

## 2024-03-12 DIAGNOSIS — Z5331 Laparoscopic surgical procedure converted to open procedure: Secondary | ICD-10-CM

## 2024-03-12 DIAGNOSIS — D72828 Other elevated white blood cell count: Secondary | ICD-10-CM | POA: Diagnosis not present

## 2024-03-12 DIAGNOSIS — I10 Essential (primary) hypertension: Secondary | ICD-10-CM | POA: Diagnosis present

## 2024-03-12 DIAGNOSIS — I493 Ventricular premature depolarization: Secondary | ICD-10-CM | POA: Diagnosis not present

## 2024-03-12 DIAGNOSIS — I4891 Unspecified atrial fibrillation: Secondary | ICD-10-CM | POA: Diagnosis not present

## 2024-03-12 DIAGNOSIS — Z8249 Family history of ischemic heart disease and other diseases of the circulatory system: Secondary | ICD-10-CM

## 2024-03-12 DIAGNOSIS — K219 Gastro-esophageal reflux disease without esophagitis: Secondary | ICD-10-CM | POA: Diagnosis present

## 2024-03-12 DIAGNOSIS — I97191 Other postprocedural cardiac functional disturbances following other surgery: Secondary | ICD-10-CM | POA: Diagnosis not present

## 2024-03-12 DIAGNOSIS — C3412 Malignant neoplasm of upper lobe, left bronchus or lung: Principal | ICD-10-CM | POA: Diagnosis present

## 2024-03-12 DIAGNOSIS — Z452 Encounter for adjustment and management of vascular access device: Secondary | ICD-10-CM | POA: Diagnosis not present

## 2024-03-12 DIAGNOSIS — Z8701 Personal history of pneumonia (recurrent): Secondary | ICD-10-CM

## 2024-03-12 DIAGNOSIS — J95811 Postprocedural pneumothorax: Secondary | ICD-10-CM | POA: Diagnosis not present

## 2024-03-12 DIAGNOSIS — Z48813 Encounter for surgical aftercare following surgery on the respiratory system: Secondary | ICD-10-CM | POA: Diagnosis not present

## 2024-03-12 DIAGNOSIS — E785 Hyperlipidemia, unspecified: Secondary | ICD-10-CM | POA: Diagnosis present

## 2024-03-12 DIAGNOSIS — Z9889 Other specified postprocedural states: Secondary | ICD-10-CM | POA: Diagnosis not present

## 2024-03-12 DIAGNOSIS — J939 Pneumothorax, unspecified: Secondary | ICD-10-CM | POA: Diagnosis not present

## 2024-03-12 HISTORY — PX: LYMPH NODE BIOPSY: SHX201

## 2024-03-12 HISTORY — PX: CHEST WALL RECONSTRUCTION: SHX5103

## 2024-03-12 HISTORY — DX: Acquired absence of lung (part of): Z90.2

## 2024-03-12 HISTORY — PX: LOBECTOMY, LUNG, ROBOT-ASSISTED, USING VATS: SHX7607

## 2024-03-12 HISTORY — PX: INTERCOSTAL NERVE BLOCK: SHX5021

## 2024-03-12 HISTORY — PX: THORACOTOMY: SHX5074

## 2024-03-12 LAB — POCT I-STAT 7, (LYTES, BLD GAS, ICA,H+H)
Acid-Base Excess: 0 mmol/L (ref 0.0–2.0)
Acid-base deficit: 3 mmol/L — ABNORMAL HIGH (ref 0.0–2.0)
Acid-base deficit: 2 mmol/L (ref 0.0–2.0)
Bicarbonate: 25.2 mmol/L (ref 20.0–28.0)
Bicarbonate: 25.7 mmol/L (ref 20.0–28.0)
Bicarbonate: 25.5 mmol/L (ref 20.0–28.0)
Calcium, Ion: 1.26 mmol/L (ref 1.15–1.40)
Calcium, Ion: 1.25 mmol/L (ref 1.15–1.40)
Calcium, Ion: 1.18 mmol/L (ref 1.15–1.40)
HCT: 33 % — ABNORMAL LOW (ref 39.0–52.0)
HCT: 33 % — ABNORMAL LOW (ref 39.0–52.0)
HCT: 32 % — ABNORMAL LOW (ref 39.0–52.0)
Hemoglobin: 11.2 g/dL — ABNORMAL LOW (ref 13.0–17.0)
Hemoglobin: 11.2 g/dL — ABNORMAL LOW (ref 13.0–17.0)
Hemoglobin: 10.9 g/dL — ABNORMAL LOW (ref 13.0–17.0)
O2 Saturation: 99 %
O2 Saturation: 98 %
O2 Saturation: 99 %
Patient temperature: 35.2
Patient temperature: 35.6
Patient temperature: 36.3
Potassium: 4.3 mmol/L (ref 3.5–5.1)
Potassium: 4.5 mmol/L (ref 3.5–5.1)
Potassium: 4.5 mmol/L (ref 3.5–5.1)
Sodium: 140 mmol/L (ref 135–145)
Sodium: 140 mmol/L (ref 135–145)
Sodium: 140 mmol/L (ref 135–145)
TCO2: 27 mmol/L (ref 22–32)
TCO2: 27 mmol/L (ref 22–32)
TCO2: 27 mmol/L (ref 22–32)
pCO2 arterial: 56.6 mmHg — ABNORMAL HIGH (ref 32–48)
pCO2 arterial: 53.3 mmHg — ABNORMAL HIGH (ref 32–48)
pCO2 arterial: 44.2 mmHg (ref 32–48)
pH, Arterial: 7.247 — ABNORMAL LOW (ref 7.35–7.45)
pH, Arterial: 7.284 — ABNORMAL LOW (ref 7.35–7.45)
pH, Arterial: 7.366 (ref 7.35–7.45)
pO2, Arterial: 170 mmHg — ABNORMAL HIGH (ref 83–108)
pO2, Arterial: 106 mmHg (ref 83–108)
pO2, Arterial: 154 mmHg — ABNORMAL HIGH (ref 83–108)

## 2024-03-12 LAB — ABO/RH: ABO/RH(D): O POS

## 2024-03-12 LAB — PREPARE RBC (CROSSMATCH)

## 2024-03-12 SURGERY — LOBECTOMY, LUNG, ROBOT-ASSISTED, USING VATS
Anesthesia: General | Site: Chest | Laterality: Left

## 2024-03-12 MED ORDER — ONDANSETRON HCL 4 MG/2ML IJ SOLN
INTRAMUSCULAR | Status: DC | PRN
  Administered 2024-03-12: 4 mg via INTRAVENOUS

## 2024-03-12 MED ORDER — SODIUM CHLORIDE 0.9% FLUSH
10.0000 mL | Freq: Two times a day (BID) | INTRAVENOUS | Status: DC
  Administered 2024-03-12: 20 mL
  Administered 2024-03-13: 10 mL
  Administered 2024-03-13: 20 mL
  Administered 2024-03-14 – 2024-03-18 (×7): 10 mL

## 2024-03-12 MED ORDER — ROSUVASTATIN CALCIUM 5 MG PO TABS
10.0000 mg | ORAL_TABLET | ORAL | Status: DC
  Administered 2024-03-14 – 2024-03-18 (×3): 10 mg via ORAL
  Filled 2024-03-12 (×5): qty 2

## 2024-03-12 MED ORDER — HYDROMORPHONE HCL 1 MG/ML IJ SOLN
INTRAMUSCULAR | Status: DC | PRN
  Administered 2024-03-12: .5 mg via INTRAVENOUS

## 2024-03-12 MED ORDER — CEFAZOLIN SODIUM-DEXTROSE 2-4 GM/100ML-% IV SOLN
2.0000 g | Freq: Three times a day (TID) | INTRAVENOUS | Status: AC
  Administered 2024-03-12 – 2024-03-13 (×2): 2 g via INTRAVENOUS
  Filled 2024-03-12 (×2): qty 100

## 2024-03-12 MED ORDER — SODIUM CHLORIDE FLUSH 0.9 % IV SOLN
INTRAVENOUS | Status: DC | PRN
  Administered 2024-03-12: 100 mL

## 2024-03-12 MED ORDER — LIDOCAINE 2% (20 MG/ML) 5 ML SYRINGE
INTRAMUSCULAR | Status: AC
Start: 2024-03-12 — End: 2024-03-12
  Filled 2024-03-12: qty 5

## 2024-03-12 MED ORDER — ONDANSETRON HCL 4 MG/2ML IJ SOLN
INTRAMUSCULAR | Status: AC
  Filled 2024-03-12: qty 2

## 2024-03-12 MED ORDER — IPRATROPIUM-ALBUTEROL 0.5-2.5 (3) MG/3ML IN SOLN
3.0000 mL | Freq: Four times a day (QID) | RESPIRATORY_TRACT | Status: DC
  Administered 2024-03-12 – 2024-03-13 (×4): 3 mL via RESPIRATORY_TRACT
  Filled 2024-03-12 (×4): qty 3

## 2024-03-12 MED ORDER — OXYCODONE HCL 5 MG PO TABS: 5.0000 mg | ORAL_TABLET | ORAL | Status: DC | PRN

## 2024-03-12 MED ORDER — TRAMADOL HCL 50 MG PO TABS
50.0000 mg | ORAL_TABLET | Freq: Four times a day (QID) | ORAL | Status: DC | PRN
  Administered 2024-03-14 – 2024-03-15 (×3): 50 mg via ORAL
  Filled 2024-03-12 (×3): qty 1

## 2024-03-12 MED ORDER — ENOXAPARIN SODIUM 40 MG/0.4ML IJ SOSY
40.0000 mg | PREFILLED_SYRINGE | Freq: Every day | INTRAMUSCULAR | Status: DC
  Administered 2024-03-13 – 2024-03-18 (×6): 40 mg via SUBCUTANEOUS
  Filled 2024-03-12 (×6): qty 0.4

## 2024-03-12 MED ORDER — ALBUMIN HUMAN 5 % IV SOLN: INTRAVENOUS | Status: DC | PRN

## 2024-03-12 MED ORDER — DEXAMETHASONE SODIUM PHOSPHATE 10 MG/ML IJ SOLN
INTRAMUSCULAR | Status: DC | PRN
  Administered 2024-03-12: 10 mg via INTRAVENOUS

## 2024-03-12 MED ORDER — SODIUM CHLORIDE (PF) 0.9 % IJ SOLN
INTRAMUSCULAR | Status: AC
  Filled 2024-03-12: qty 50

## 2024-03-12 MED ORDER — ASPIRIN 81 MG PO TBEC
81.0000 mg | DELAYED_RELEASE_TABLET | ORAL | Status: DC
  Administered 2024-03-13 – 2024-03-19 (×4): 81 mg via ORAL
  Filled 2024-03-12 (×7): qty 1

## 2024-03-12 MED ORDER — SODIUM CHLORIDE 0.9 % IV SOLN: INTRAVENOUS | Status: DC | PRN

## 2024-03-12 MED ORDER — CEFAZOLIN SODIUM-DEXTROSE 2-4 GM/100ML-% IV SOLN
2.0000 g | INTRAVENOUS | Status: AC
  Administered 2024-03-12 (×2): 2 g via INTRAVENOUS
  Filled 2024-03-12: qty 100

## 2024-03-12 MED ORDER — AMLODIPINE BESYLATE 5 MG PO TABS
5.0000 mg | ORAL_TABLET | Freq: Every day | ORAL | Status: DC
  Administered 2024-03-13 – 2024-03-19 (×7): 5 mg via ORAL
  Filled 2024-03-12 (×7): qty 1

## 2024-03-12 MED ORDER — VASOPRESSIN 20 UNIT/ML IV SOLN
INTRAVENOUS | Status: AC
Start: 2024-03-12 — End: 2024-03-12
  Filled 2024-03-12: qty 1

## 2024-03-12 MED ORDER — PROPOFOL 10 MG/ML IV BOLUS
INTRAVENOUS | Status: DC | PRN
  Administered 2024-03-12: 100 mg via INTRAVENOUS

## 2024-03-12 MED ORDER — LACTATED RINGERS IV SOLN: INTRAVENOUS | Status: DC

## 2024-03-12 MED ORDER — NALOXONE HCL 0.4 MG/ML IJ SOLN
INTRAMUSCULAR | Status: AC
  Filled 2024-03-12: qty 1

## 2024-03-12 MED ORDER — BISACODYL 5 MG PO TBEC
10.0000 mg | DELAYED_RELEASE_TABLET | Freq: Every day | ORAL | Status: DC
  Administered 2024-03-12 – 2024-03-15 (×3): 10 mg via ORAL
  Filled 2024-03-12 (×4): qty 2

## 2024-03-12 MED ORDER — NALOXONE HCL 0.4 MG/ML IJ SOLN
INTRAMUSCULAR | Status: DC | PRN
  Administered 2024-03-12: 80 ug via INTRAVENOUS

## 2024-03-12 MED ORDER — FENTANYL CITRATE (PF) 250 MCG/5ML IJ SOLN
INTRAMUSCULAR | Status: AC
  Filled 2024-03-12: qty 5

## 2024-03-12 MED ORDER — SUGAMMADEX SODIUM 200 MG/2ML IV SOLN
INTRAVENOUS | Status: AC
  Filled 2024-03-12: qty 2

## 2024-03-12 MED ORDER — KETOROLAC TROMETHAMINE 15 MG/ML IJ SOLN
15.0000 mg | Freq: Four times a day (QID) | INTRAMUSCULAR | Status: AC
  Administered 2024-03-12 – 2024-03-14 (×7): 15 mg via INTRAVENOUS
  Filled 2024-03-12 (×8): qty 1

## 2024-03-12 MED ORDER — MORPHINE SULFATE (PF) 2 MG/ML IV SOLN: 2.0000 mg | INTRAVENOUS | Status: DC | PRN

## 2024-03-12 MED ORDER — BUPIVACAINE LIPOSOME 1.3 % IJ SUSP
INTRAMUSCULAR | Status: AC
  Filled 2024-03-12: qty 20

## 2024-03-12 MED ORDER — LUNG SURGERY BOOK
Freq: Once | Status: AC
  Filled 2024-03-12: qty 1

## 2024-03-12 MED ORDER — FENTANYL CITRATE (PF) 250 MCG/5ML IJ SOLN
INTRAMUSCULAR | Status: DC | PRN
  Administered 2024-03-12 (×2): 100 ug via INTRAVENOUS
  Administered 2024-03-12: 50 ug via INTRAVENOUS

## 2024-03-12 MED ORDER — 0.9 % SODIUM CHLORIDE (POUR BTL) OPTIME
TOPICAL | Status: DC | PRN
  Administered 2024-03-12: 1000 mL

## 2024-03-12 MED ORDER — MIDAZOLAM HCL 2 MG/2ML IJ SOLN
INTRAMUSCULAR | Status: AC
  Filled 2024-03-12: qty 2

## 2024-03-12 MED ORDER — HYDROMORPHONE HCL 1 MG/ML IJ SOLN: 0.2500 mg | INTRAMUSCULAR | Status: DC | PRN

## 2024-03-12 MED ORDER — ACETAMINOPHEN 160 MG/5ML PO SOLN: 1000.0000 mg | Freq: Four times a day (QID) | ORAL | Status: AC

## 2024-03-12 MED ORDER — CHLORHEXIDINE GLUCONATE CLOTH 2 % EX PADS
6.0000 | MEDICATED_PAD | Freq: Every day | CUTANEOUS | Status: DC
  Administered 2024-03-12 – 2024-03-17 (×6): 6 via TOPICAL

## 2024-03-12 MED ORDER — MIDAZOLAM HCL 2 MG/2ML IJ SOLN
INTRAMUSCULAR | Status: DC | PRN
  Administered 2024-03-12 (×2): 1 mg via INTRAVENOUS

## 2024-03-12 MED ORDER — CHLORHEXIDINE GLUCONATE 0.12 % MT SOLN
15.0000 mL | Freq: Once | OROMUCOSAL | Status: AC
  Administered 2024-03-12: 15 mL via OROMUCOSAL
  Filled 2024-03-12: qty 15

## 2024-03-12 MED ORDER — PANTOPRAZOLE SODIUM 40 MG PO TBEC
40.0000 mg | DELAYED_RELEASE_TABLET | Freq: Every day | ORAL | Status: DC
  Administered 2024-03-13 – 2024-03-19 (×7): 40 mg via ORAL
  Filled 2024-03-12 (×7): qty 1

## 2024-03-12 MED ORDER — ACETAMINOPHEN 500 MG PO TABS
1000.0000 mg | ORAL_TABLET | Freq: Once | ORAL | Status: AC
  Administered 2024-03-12: 1000 mg via ORAL
  Filled 2024-03-12: qty 2

## 2024-03-12 MED ORDER — SODIUM CHLORIDE 0.9% FLUSH: 10.0000 mL | INTRAVENOUS | Status: DC | PRN

## 2024-03-12 MED ORDER — SENNOSIDES-DOCUSATE SODIUM 8.6-50 MG PO TABS
1.0000 | ORAL_TABLET | Freq: Every day | ORAL | Status: DC
  Administered 2024-03-12: 1 via ORAL
  Filled 2024-03-12 (×2): qty 1

## 2024-03-12 MED ORDER — SUGAMMADEX SODIUM 200 MG/2ML IV SOLN
INTRAVENOUS | Status: DC | PRN
  Administered 2024-03-12: 200 mg via INTRAVENOUS

## 2024-03-12 MED ORDER — DEXAMETHASONE SODIUM PHOSPHATE 10 MG/ML IJ SOLN
INTRAMUSCULAR | Status: AC
Start: 2024-03-12 — End: 2024-03-12
  Filled 2024-03-12: qty 1

## 2024-03-12 MED ORDER — PHENYLEPHRINE 80 MCG/ML (10ML) SYRINGE FOR IV PUSH (FOR BLOOD PRESSURE SUPPORT)
PREFILLED_SYRINGE | INTRAVENOUS | Status: AC
  Filled 2024-03-12: qty 10

## 2024-03-12 MED ORDER — PROPOFOL 10 MG/ML IV BOLUS
INTRAVENOUS | Status: AC
  Filled 2024-03-12: qty 20

## 2024-03-12 MED ORDER — EPHEDRINE 5 MG/ML INJ
INTRAVENOUS | Status: AC
  Filled 2024-03-12: qty 5

## 2024-03-12 MED ORDER — ROCURONIUM BROMIDE 10 MG/ML (PF) SYRINGE
PREFILLED_SYRINGE | INTRAVENOUS | Status: AC
  Filled 2024-03-12: qty 10

## 2024-03-12 MED ORDER — EPHEDRINE SULFATE-NACL 50-0.9 MG/10ML-% IV SOSY
PREFILLED_SYRINGE | INTRAVENOUS | Status: DC | PRN
  Administered 2024-03-12 (×3): 5 mg via INTRAVENOUS

## 2024-03-12 MED ORDER — PHENYLEPHRINE HCL-NACL 20-0.9 MG/250ML-% IV SOLN
INTRAVENOUS | Status: DC | PRN
  Administered 2024-03-12: 20 ug/min via INTRAVENOUS

## 2024-03-12 MED ORDER — ONDANSETRON HCL 4 MG/2ML IJ SOLN
4.0000 mg | Freq: Four times a day (QID) | INTRAMUSCULAR | Status: DC | PRN
Start: 2024-03-12 — End: 2024-03-19

## 2024-03-12 MED ORDER — ROCURONIUM BROMIDE 10 MG/ML (PF) SYRINGE
PREFILLED_SYRINGE | INTRAVENOUS | Status: DC | PRN
  Administered 2024-03-12: 30 mg via INTRAVENOUS
  Administered 2024-03-12 (×2): 20 mg via INTRAVENOUS
  Administered 2024-03-12: 80 mg via INTRAVENOUS

## 2024-03-12 MED ORDER — GABAPENTIN 300 MG PO CAPS
300.0000 mg | ORAL_CAPSULE | Freq: Every day | ORAL | Status: AC
  Administered 2024-03-12 – 2024-03-14 (×3): 300 mg via ORAL
  Filled 2024-03-12 (×3): qty 1

## 2024-03-12 MED ORDER — ORAL CARE MOUTH RINSE: 15.0000 mL | Freq: Once | OROMUCOSAL | Status: AC

## 2024-03-12 MED ORDER — BUPIVACAINE HCL (PF) 0.5 % IJ SOLN
INTRAMUSCULAR | Status: AC
  Filled 2024-03-12: qty 30

## 2024-03-12 MED ORDER — GABAPENTIN 300 MG PO CAPS
300.0000 mg | ORAL_CAPSULE | Freq: Two times a day (BID) | ORAL | Status: DC
  Administered 2024-03-15 – 2024-03-19 (×8): 300 mg via ORAL
  Filled 2024-03-12 (×8): qty 1

## 2024-03-12 MED ORDER — CEFAZOLIN SODIUM 1 G IJ SOLR
INTRAMUSCULAR | Status: AC
  Filled 2024-03-12: qty 10

## 2024-03-12 MED ORDER — ACETAMINOPHEN 500 MG PO TABS
1000.0000 mg | ORAL_TABLET | Freq: Four times a day (QID) | ORAL | Status: AC
  Administered 2024-03-12 – 2024-03-17 (×16): 1000 mg via ORAL
  Filled 2024-03-12 (×18): qty 2

## 2024-03-12 MED ORDER — SODIUM CHLORIDE 0.9 % IV SOLN: 10.0000 mL/h | Freq: Once | INTRAVENOUS | Status: DC

## 2024-03-12 MED ORDER — DEXTROSE-SODIUM CHLORIDE 5-0.9 % IV SOLN: INTRAVENOUS | Status: DC

## 2024-03-12 MED ORDER — HYDROMORPHONE HCL 1 MG/ML IJ SOLN
INTRAMUSCULAR | Status: AC
  Filled 2024-03-12: qty 0.5

## 2024-03-12 MED ORDER — SODIUM CHLORIDE 0.9% IV SOLUTION
INTRAVENOUS | Status: AC | PRN
  Administered 2024-03-12: 1000 mL

## 2024-03-12 SURGICAL SUPPLY — 80 items
CANISTER SUCTION 3000ML PPV (SUCTIONS) ×4 IMPLANT
CANNULA REDUCER 12-8 DVNC XI (CANNULA) ×4 IMPLANT
CLIP TI MEDIUM 6 (CLIP) IMPLANT
CNTNR URN SCR LID CUP LEK RST (MISCELLANEOUS) ×10 IMPLANT
COVER SURGICAL LIGHT HANDLE (MISCELLANEOUS) ×4 IMPLANT
DEFOGGER SCOPE WARM SEASHARP (MISCELLANEOUS) ×2 IMPLANT
DERMABOND ADVANCED .7 DNX12 (GAUZE/BANDAGES/DRESSINGS) ×2 IMPLANT
DRAIN CHANNEL 28F RND 3/8 FF (WOUND CARE) IMPLANT
DRAIN CONNECTOR BLAKE 1:1 (MISCELLANEOUS) IMPLANT
DRAPE ARM DVNC X/XI (DISPOSABLE) ×8 IMPLANT
DRAPE CHEST BREAST 15X10 FENES (DRAPES) ×2 IMPLANT
DRAPE COLUMN DVNC XI (DISPOSABLE) ×2 IMPLANT
DRAPE CV SPLIT W-CLR ANES SCRN (DRAPES) ×2 IMPLANT
DRAPE HALF SHEET 40X57 (DRAPES) ×2 IMPLANT
DRAPE SURG ORHT 6 SPLT 77X108 (DRAPES) ×2 IMPLANT
ELECT BLADE 6.5 EXT (BLADE) ×2 IMPLANT
ELECTRODE REM PT RTRN 9FT ADLT (ELECTROSURGICAL) ×2 IMPLANT
FORCEPS BPLR FENES DVNC XI (FORCEP) IMPLANT
FORCEPS BPLR LNG DVNC XI (INSTRUMENTS) IMPLANT
GAUZE KITTNER 4X5 RF (MISCELLANEOUS) ×4 IMPLANT
GAUZE SPONGE 4X4 12PLY STRL (GAUZE/BANDAGES/DRESSINGS) ×2 IMPLANT
GLOVE SS BIOGEL STRL SZ 7.5 (GLOVE) ×4 IMPLANT
GLOVE SURG POLYISO LF SZ8 (GLOVE) ×2 IMPLANT
GLOVE SURG SIGNA 7.5 PF LTX (GLOVE) ×6 IMPLANT
GOWN STRL REUS W/ TWL LRG LVL3 (GOWN DISPOSABLE) ×4 IMPLANT
GOWN STRL REUS W/ TWL XL LVL3 (GOWN DISPOSABLE) ×4 IMPLANT
GOWN STRL REUS W/TWL 2XL LVL3 (GOWN DISPOSABLE) ×2 IMPLANT
GRASPER TIP-UP FEN DVNC XI (INSTRUMENTS) IMPLANT
HEMOSTAT SURGICEL 2X14 (HEMOSTASIS) ×6 IMPLANT
IRRIGATION STRYKERFLOW (MISCELLANEOUS) ×2 IMPLANT
KIT BASIN OR (CUSTOM PROCEDURE TRAY) ×2 IMPLANT
KIT TURNOVER KIT B (KITS) ×2 IMPLANT
MESH OVITEX 2S PERM 10X20 8L (Mesh General) IMPLANT
NDL HYPO 25GX1X1/2 BEV (NEEDLE) ×2 IMPLANT
NDL SPNL 22GX3.5 QUINCKE BK (NEEDLE) ×2 IMPLANT
NEEDLE HYPO 25GX1X1/2 BEV (NEEDLE) ×2 IMPLANT
NEEDLE SPNL 22GX3.5 QUINCKE BK (NEEDLE) ×2 IMPLANT
NS IRRIG 1000ML POUR BTL (IV SOLUTION) ×6 IMPLANT
PACK CHEST (CUSTOM PROCEDURE TRAY) ×2 IMPLANT
PAD ARMBOARD POSITIONER FOAM (MISCELLANEOUS) ×4 IMPLANT
PORT ACCESS TROCAR AIRSEAL 12 (TROCAR) ×2 IMPLANT
RELOAD STAPLE 30 TAN MED ART (ENDOMECHANICALS) IMPLANT
RELOAD STAPLE 45 2.5 WHT DVNC (STAPLE) IMPLANT
RELOAD STAPLE 45 3.5 BLU DVNC (STAPLE) IMPLANT
RELOAD STAPLE 45 4.1 GRN THCK (STAPLE) IMPLANT
RELOAD STAPLER 2.5X45 WHT DVNC (STAPLE) ×10 IMPLANT
RELOAD STAPLER 3.5X45 BLU DVNC (STAPLE) ×12 IMPLANT
RELOAD TRI 2.0 30 VAS MED SUL (ENDOMECHANICALS) ×6 IMPLANT
SEAL UNIV 5-12 XI (MISCELLANEOUS) ×8 IMPLANT
SEALANT PROGEL (MISCELLANEOUS) IMPLANT
SOLUTION ELECTROSURG ANTI STCK (MISCELLANEOUS) ×2 IMPLANT
SPECIMEN JAR MEDIUM (MISCELLANEOUS) ×2 IMPLANT
SPIKE FLUID TRANSFER (MISCELLANEOUS) ×2 IMPLANT
SPONGE INTESTINAL PEANUT (DISPOSABLE) IMPLANT
SPONGE TONSIL 1 RF SGL (DISPOSABLE) ×2 IMPLANT
STAPLE RELOAD 45 GRN (STAPLE) ×4 IMPLANT
STAPLER 45 SUREFORM CVD DVNC (STAPLE) IMPLANT
STAPLER ENDO GIA 12 SHRT THIN (STAPLE) IMPLANT
STAPLER ENDO GIA 12MM SHORT (STAPLE) ×2 IMPLANT
SUT SILK 1 MH (SUTURE) ×4 IMPLANT
SUT SILK 2 0 SH (SUTURE) ×2 IMPLANT
SUT VIC AB 1 CTX 18 (SUTURE) ×2 IMPLANT
SUT VIC AB 1 CTX36XBRD ANBCTR (SUTURE) ×2 IMPLANT
SUT VIC AB 2-0 CTX 36 (SUTURE) ×2 IMPLANT
SUT VIC AB 3-0 MH 27 (SUTURE) IMPLANT
SUT VIC AB 3-0 X1 27 (SUTURE) ×4 IMPLANT
SUT VICRYL 0 TIES 12 18 (SUTURE) ×2 IMPLANT
SUT VICRYL 0 UR6 27IN ABS (SUTURE) ×4 IMPLANT
SWAB CULTURE ESWAB REG 1ML (MISCELLANEOUS) IMPLANT
SYR 10ML LL (SYRINGE) ×2 IMPLANT
SYR 20CC LL (SYRINGE) ×4 IMPLANT
SYSTEM RETRIEVAL ANCHOR 15 (MISCELLANEOUS) IMPLANT
SYSTEM SAHARA CHEST DRAIN ATS (WOUND CARE) ×2 IMPLANT
TAPE CLOTH 4X10 WHT NS (GAUZE/BANDAGES/DRESSINGS) ×2 IMPLANT
TIP SPRAY PROGEL 11IN (MISCELLANEOUS) IMPLANT
TOWEL GREEN STERILE (TOWEL DISPOSABLE) ×4 IMPLANT
TOWEL GREEN STERILE FF (TOWEL DISPOSABLE) ×2 IMPLANT
TRAY FOLEY MTR SLVR 16FR STAT (SET/KITS/TRAYS/PACK) ×2 IMPLANT
TRAY FOLEY SLVR 16FR LF STAT (SET/KITS/TRAYS/PACK) ×2 IMPLANT
WATER STERILE IRR 1000ML POUR (IV SOLUTION) ×4 IMPLANT

## 2024-03-12 NOTE — Anesthesia Procedure Notes (Signed)
 Procedure Name: Intubation Date/Time: 03/12/2024 12:25 PM  Performed by: Delores Dus, CRNAPre-anesthesia Checklist: Patient identified, Emergency Drugs available, Suction available and Patient being monitored Patient Re-evaluated:Patient Re-evaluated prior to induction Oxygen Delivery Method: Circle system utilized Preoxygenation: Pre-oxygenation with 100% oxygen Induction Type: IV induction Ventilation: Mask ventilation without difficulty Laryngoscope Size: Miller, 2, Mac, 3 and Glidescope Grade View: Grade II Tube type: Oral Endobronchial tube: EBT position confirmed by fiberoptic bronchoscope, Double lumen EBT, EBT position confirmed by auscultation and Left and 39 Fr Number of attempts: 1 Airway Equipment and Method: Stylet and Oral airway Placement Confirmation: ETT inserted through vocal cords under direct vision, positive ETCO2 and breath sounds checked- equal and bilateral Tube secured with: Tape Dental Injury: Teeth and Oropharynx as per pre-operative assessment  Comments: Small mouth opening, slight anterior view. Attempted x 1 by BBrown with Miller 2. Dr. Epifanio x 1 with MAC 3. Glidescope x 1 with lopro 3. Repositioned until DLT in correct placement confirmed by clear sight. Left lung in down position

## 2024-03-12 NOTE — Transfer of Care (Signed)
 Immediate Anesthesia Transfer of Care Note  Patient: Russell [name]  Procedure(s) Performed: LOBECTOMY, LUNG, ROBOT-ASSISTED, USING VATS (Left: Chest) THORACOTOMY, MAJOR (Left) RECONSTRUCTION, MAJOR, CHEST WALL (Left) BLOCK, NERVE, INTERCOSTAL (Left: Chest) LYMPH NODE BIOPSY (Left: Chest)  Patient Location: PACU  Anesthesia Type:General  Level of Consciousness: awake and alert   Airway & Oxygen Therapy: Patient Spontanous Breathing and Patient connected to face mask oxygen  Post-op Assessment: Report given to RN and Post -op Vital signs reviewed and stable  Post vital signs: Reviewed and stable  Last Vitals:  Vitals Value Taken Time  BP 108/63 03/12/24 18:00  Temp    Pulse 68 03/12/24 18:04  Resp 19 03/12/24 18:04  SpO2 95 % 03/12/24 18:04  Vitals shown include unfiled device data.  Last Pain:  Vitals:   03/12/24 1056  TempSrc:   PainSc: 0-No pain      Patients Stated Pain Goal: 0 (03/12/24 1056)  Complications: No notable events documented.

## 2024-03-12 NOTE — Anesthesia Procedure Notes (Signed)
 Central Venous Catheter Insertion Performed by: Epifanio Fallow, MD, anesthesiologist Start/End7/03/2024 11:15 AM, 03/12/2024 11:30 AM Patient location: Pre-op. Preanesthetic checklist: patient identified, IV checked, site marked, risks and benefits discussed, surgical consent, monitors and equipment checked, pre-op evaluation, timeout performed and anesthesia consent Position: Trendelenburg Lidocaine  1% used for infiltration and patient sedated Hand hygiene performed , maximum sterile barriers used  and Seldinger technique used Catheter size: 8 Fr Total catheter length 16. Central line was placed.Double lumen Procedure performed without using ultrasound guided technique. Attempts: 1 Following insertion, dressing applied, line sutured and Biopatch. Post procedure assessment: blood return through all ports, free fluid flow and no air  Patient tolerated the procedure well with no immediate complications.

## 2024-03-12 NOTE — Anesthesia Procedure Notes (Signed)
 Arterial Line Insertion Start/End7/03/2024 11:10 AM, 03/12/2024 11:15 AM Performed by: Epifanio Fallow, MD, Delores Dus, CRNA, CRNA  Patient location: Pre-op. Preanesthetic checklist: patient identified, IV checked, site marked, risks and benefits discussed, surgical consent, monitors and equipment checked, pre-op evaluation, timeout performed and anesthesia consent Lidocaine  1% used for infiltration Right, radial was placed Catheter size: 20 G Hand hygiene performed  and maximum sterile barriers used   Attempts: 1 Procedure performed without using ultrasound guided technique. Following insertion, dressing applied and Biopatch. Post procedure assessment: normal and unchanged

## 2024-03-12 NOTE — Interval H&P Note (Signed)
 History and Physical Interval Note:  CT reviewed, continued growth of mass, no new lesions   03/12/2024 11:41 AM  Rutha JAYSON Sar  has presented today for surgery, with the diagnosis of SQUAMOUS CELL CARCINOMA OF LUNG.  The various methods of treatment have been discussed with the patient and family. After consideration of risks, benefits and other options for treatment, the patient has consented to  Procedure(s) with comments: LOBECTOMY, LUNG, ROBOT-ASSISTED, USING VATS (Left) - LEFT ROBOTIC LEFT UPPER LOBECTOMY, POSSIBLE THORACOTOMY, POSSIBLE CHEST WALL RESECTION THORACOTOMY, MAJOR (Left) RECONSTRUCTION, MAJOR, CHEST WALL (Left) as a surgical intervention.  The patient's history has been reviewed, patient examined, no change in status, stable for surgery.  I have reviewed the patient's chart and labs.  Questions were answered to the patient's satisfaction.     Elspeth JAYSON Millers

## 2024-03-12 NOTE — Plan of Care (Signed)
  Problem: Education: Goal: Knowledge of General Education information will improve Description: Including pain rating scale, medication(s)/side effects and non-pharmacologic comfort measures Outcome: Progressing   Problem: Health Behavior/Discharge Planning: Goal: Ability to manage health-related needs will improve Outcome: Progressing   Problem: Clinical Measurements: Goal: Ability to maintain clinical measurements within normal limits will improve Outcome: Progressing Goal: Will remain free from infection Outcome: Progressing Goal: Diagnostic test results will improve Outcome: Progressing Goal: Respiratory complications will improve Outcome: Progressing Goal: Cardiovascular complication will be avoided Outcome: Progressing   Problem: Activity: Goal: Risk for activity intolerance will decrease Outcome: Progressing   Problem: Nutrition: Goal: Adequate nutrition will be maintained Outcome: Progressing   Problem: Coping: Goal: Level of anxiety will decrease Outcome: Progressing   Problem: Elimination: Goal: Will not experience complications related to urinary retention Outcome: Progressing   Problem: Pain Managment: Goal: General experience of comfort will improve and/or be controlled Outcome: Progressing   Problem: Safety: Goal: Ability to remain free from injury will improve Outcome: Progressing   Problem: Skin Integrity: Goal: Risk for impaired skin integrity will decrease Outcome: Progressing   Problem: Education: Goal: Knowledge of disease or condition will improve Outcome: Progressing Goal: Knowledge of the prescribed therapeutic regimen will improve Outcome: Progressing   Problem: Activity: Goal: Risk for activity intolerance will decrease Outcome: Progressing   Problem: Cardiac: Goal: Will achieve and/or maintain hemodynamic stability Outcome: Progressing   Problem: Clinical Measurements: Goal: Postoperative complications will be avoided or  minimized Outcome: Progressing   Problem: Respiratory: Goal: Respiratory status will improve Outcome: Progressing   Problem: Pain Management: Goal: Pain level will decrease Outcome: Progressing   Problem: Skin Integrity: Goal: Wound healing without signs and symptoms infection will improve Outcome: Progressing

## 2024-03-12 NOTE — Brief Op Note (Signed)
 03/12/2024  5:36 PM  PATIENT:  Russell [name] Sar  73 y.o. male  PRE-OPERATIVE DIAGNOSIS:  SQUAMOUS CELL CARCINOMA OF LUNG  POST-OPERATIVE DIAGNOSIS:  SQUAMOUS CELL CARCINOMA OF LUNG  PROCEDURE:   LOBECTOMY, LUNG, ROBOT-ASSISTED, USING VATS (Left)  THORACOTOMY, MAJOR (Left) RECONSTRUCTION, MAJOR, CHEST WALL (Left) BLOCK, NERVE, INTERCOSTAL (Left) LYMPH NODE BIOPSY (Left)  SURGEON:  Surgeons and Role:    * Kerrin Elspeth JAYSON, MD - Primary  PHYSICIAN ASSISTANT: Con Bend PA-C  ASSISTANTS: none   ANESTHESIA:   local and general  EBL:  200 mL   BLOOD ADMINISTERED:none  DRAINS: left pleural drain   LOCAL MEDICATIONS USED:  OTHER exparel   SPECIMEN:  Source of Specimen:  LUL, 3rd and 4th rib, lymph nodes  DISPOSITION OF SPECIMEN:  PATHOLOGY  COUNTS:  YES  TOURNIQUET:  * No tourniquets in log *  DICTATION: .Dragon Dictation  PLAN OF CARE: Admit to inpatient   PATIENT DISPOSITION:  PACU - hemodynamically stable.   Delay start of Pharmacological VTE agent (>24hrs) due to surgical blood loss or risk of bleeding: no

## 2024-03-12 NOTE — Hospital Course (Addendum)
 PCP is Zollie Lowers, MD Referring Provider is Ruthell Lauraine FALCON, NP    HPI: At time of CT surgical consultation   Russell [name] is sent for consultation regarding a squamous cell carcinoma of the left upper lobe.   PCP is Zollie Lowers, MD Referring Provider is Ruthell Lauraine FALCON, NP   Russell [name] is a 73 year old man with a history of tobacco abuse, COPD, hypertension, hyperlipidemia, coronary disease, hiatal hernia, reflux, and newly diagnosed squamous cell carcinoma of the lung.   Smoked about a pack a day for over 50 years prior to quitting on May 10.  Has been in the low-dose CT for lung cancer screening program several years.  He was noted to have a cystic left upper lobe lesion in 2022.  Over time it has grown larger and increased wall thickening.  Had a PET/CT in September 2024 which showed SUV of 3.5.  A CT in January 2025 showed the nodule measured 2.7 x 3.5 cm, but had increased wall thickness compared to his prior study.  Underwent navigational bronchoscopy which showed atypical cells.  Repeat CT in April showed further increase in wall thickness and increase in size to 3.2 x 4.7 cm.  No mediastinal or hilar adenopathy.  PET/CT showed an increase in SUV to 12.  Underwent robotic bronchoscopy again and biopsy showed squamous cell carcinoma.   Has had a cough with hemoptysis since his biopsy.  Gets short of breath with heavy exertion but can walk a mile without stopping.  Can walk up 2 flights of stairs without stopping.  No chest pain, pressure, or tightness.  He does bruise easily.  No weight loss over the past 3 months.  Appetite is good.  Dr. Kerrin evaluated the patient and all relevant studies and recommended proceeding with surgical resection.  Hospital course: Patient was admitted on 03/12/2024 at which time he was taken the operating room and underwent a robotic assisted left upper lobectomy with chest wall resection.  Patient tolerated the procedure well was taken to the  postanesthesia care unit in stable condition.  Postoperative hospital course:  On postop day 1 the patient was doing well.  He maintained good oxygen saturations on room air.  Chest x-ray revealed a moderate space and chest tube was placed to 20 cm water suction.  This was gradually transition to waterseal.  He has had a small airleak and significant subcutaneous air noted on chest x-ray.  This was associated with a moderate size space.  He does have an expected acute blood loss anemia which is minor and stable.  Renal function is noted to be normal.  He is making good urine output.  He did have a mild leukocytosis which is likely reactive in nature.  He is in sinus rhythm but is having some occasional episodes of ventricular bigeminy.  Potassium and magnesium are in normal range.  This resolved over time.  Incisions are all noted to be healing well without evidence of infection.  He is tolerating gradually increasing activities using standard post operatory rehabilitation protocols.  He is doing usual pulmonary hygiene maneuvers.  He was kept on Lovenox  for DVT prophylaxis.. On postop day 4 he did go into atrial fibrillation and was placed on an amiodarone  drip with conversion back to sinus rhythm.  The amiodarone  was transitioned to the oral form after about 24 hours.  He remained in stable sinus rhythm with rates in the high 50s to mid 60s per his baseline. Small airleak persisted for several  days so Dr. Kerrin offered to transition the chest tube to an [employer] mini express collection system that can be managed at home.  Russell [name] agreed with this.  The chest tube was placed to the mini express on 03/18/2024 follow-up chest x-ray was obtained the following day showing ***

## 2024-03-12 NOTE — Anesthesia Postprocedure Evaluation (Signed)
 Anesthesia Post Note  Patient: Russell [name]  Procedure(s) Performed: LOBECTOMY, LUNG, ROBOT-ASSISTED, USING VATS (Left: Chest) THORACOTOMY, MAJOR (Left) RECONSTRUCTION, MAJOR, CHEST WALL (Left) BLOCK, NERVE, INTERCOSTAL (Left: Chest) LYMPH NODE BIOPSY (Left: Chest)     Patient location during evaluation: PACU Anesthesia Type: General Level of consciousness: awake and alert, oriented and patient cooperative Pain management: pain level controlled Vital Signs Assessment: post-procedure vital signs reviewed and stable Respiratory status: spontaneous breathing, nonlabored ventilation, respiratory function stable and patient connected to nasal cannula oxygen Cardiovascular status: blood pressure returned to baseline and stable Postop Assessment: no apparent nausea or vomiting Anesthetic complications: no   No notable events documented.  Last Vitals:  Vitals:   03/12/24 1900 03/12/24 2000  BP: 118/65 (!) 144/73  Pulse: 72 73  Resp: 17 14  Temp: 36.6 C   SpO2: 91% 95%    Last Pain:  Vitals:   03/12/24 2000  TempSrc:   PainSc: 0-No pain                 Joie Hipps,E. Chevie Birkhead

## 2024-03-13 ENCOUNTER — Inpatient Hospital Stay (HOSPITAL_COMMUNITY)

## 2024-03-13 DIAGNOSIS — Z9889 Other specified postprocedural states: Secondary | ICD-10-CM

## 2024-03-13 LAB — CBC
HCT: 31.9 % — ABNORMAL LOW (ref 39.0–52.0)
Hemoglobin: 10.6 g/dL — ABNORMAL LOW (ref 13.0–17.0)
MCH: 31.9 pg (ref 26.0–34.0)
MCHC: 33.2 g/dL (ref 30.0–36.0)
MCV: 96.1 fL (ref 80.0–100.0)
Platelets: 219 K/uL (ref 150–400)
RBC: 3.32 MIL/uL — ABNORMAL LOW (ref 4.22–5.81)
RDW: 13 % (ref 11.5–15.5)
WBC: 15.4 K/uL — ABNORMAL HIGH (ref 4.0–10.5)
nRBC: 0 % (ref 0.0–0.2)

## 2024-03-13 LAB — BASIC METABOLIC PANEL WITH GFR
Anion gap: 8 (ref 5–15)
BUN: 14 mg/dL (ref 8–23)
CO2: 24 mmol/L (ref 22–32)
Calcium: 8.3 mg/dL — ABNORMAL LOW (ref 8.9–10.3)
Chloride: 105 mmol/L (ref 98–111)
Creatinine, Ser: 1.03 mg/dL (ref 0.61–1.24)
GFR, Estimated: >60 mL/min (ref >60)
Glucose, Bld: 214 mg/dL — ABNORMAL HIGH (ref 70–99)
Potassium: 4 mmol/L (ref 3.5–5.1)
Sodium: 137 mmol/L (ref 135–145)

## 2024-03-13 LAB — MAGNESIUM: Magnesium: 2.1 mg/dL (ref 1.7–2.4)

## 2024-03-13 MED ORDER — IPRATROPIUM-ALBUTEROL 0.5-2.5 (3) MG/3ML IN SOLN: 3.0000 mL | Freq: Four times a day (QID) | RESPIRATORY_TRACT | Status: DC | PRN

## 2024-03-13 NOTE — TOC CM/SW Note (Signed)
 Transition of Care [facility]) - Inpatient Brief Assessment   Patient Details  Name: Russell [name] MRN: [id number] Date of Birth: 10/06/1950  Transition of Care [facility]) CM/SW Contact:    Lauraine FORBES Saa, LCSW Phone Number: 03/13/2024, 9:16 AM   Clinical Narrative:  9:16 AM Per chart review, patient resides at home with spouse. Patient has a PCP and insurance. Patient does not have SNF/HH/DME history. Patient's preferred pharmacy's are [facility] [address] and [facility]. No TOC needs were identified at this time. TOC will continue to follow and be available to assist.  Transition of Care Asessment: Insurance and Status: Insurance coverage has been reviewed Patient has primary care physician: Yes Home environment has been reviewed: Private Residence Prior level of function:: N/A Prior/Current Home Services: No current home services Social Drivers of Health Review: SDOH reviewed no interventions necessary Readmission risk has been reviewed: Yes Transition of care needs: no transition of care needs at this time

## 2024-03-13 NOTE — Progress Notes (Signed)
 Mobility Specialist Progress Note;   03/13/24 1453  Mobility  Activity Ambulated with assistance in hallway  Level of Assistance Contact guard assist, steadying assist  Assistive Device Front wheel walker  Distance Ambulated (ft) 100 ft  Activity Response Tolerated well  Mobility Referral Yes  Mobility visit 1 Mobility  Mobility Specialist Start Time (ACUTE ONLY) 1453  Mobility Specialist Stop Time (ACUTE ONLY) 1503  Mobility Specialist Time Calculation (min) (ACUTE ONLY) 10 min   RN requesting to try and ambulate again this PM. Required MinG assistance. Chest tube site began bleeding again so distance limited. Returned pt back to chair and left with all needs met. RN notified.   Lauraine Erm Mobility Specialist Please contact via SecureChat or [employer] [phone]

## 2024-03-13 NOTE — Plan of Care (Signed)

## 2024-03-13 NOTE — Progress Notes (Signed)
 Mobility Specialist Progress Note;   03/13/24 0930  Mobility  Activity Ambulated with assistance in hallway  Level of Assistance Contact guard assist, steadying assist  Assistive Device Front wheel walker  Distance Ambulated (ft) 100 ft  Activity Response Tolerated fair  Mobility Referral Yes  Mobility visit 1 Mobility  Mobility Specialist Start Time (ACUTE ONLY) 0930  Mobility Specialist Stop Time (ACUTE ONLY) 0942  Mobility Specialist Time Calculation (min) (ACUTE ONLY) 12 min   RN requesting pt to ambulate, pt agreeable. Required MinG assistance during ambulation for safety. While ambulating, chest tube site began leaking requiring to return to room. RN notified. Pt left in chair with all needs met. Will f/u to attempt more ambulation.   Lauraine Erm Mobility Specialist Please contact via SecureChat or [employer] [phone]

## 2024-03-13 NOTE — Progress Notes (Addendum)
 1 Day Post-Op Procedure(s) (LRB): LOBECTOMY, LUNG, ROBOT-ASSISTED, USING VATS (Left) THORACOTOMY, MAJOR (Left) RECONSTRUCTION, MAJOR, CHEST WALL (Left) BLOCK, NERVE, INTERCOSTAL (Left) LYMPH NODE BIOPSY (Left) Subjective: Minimal pain- discussed keeping ahead of the pain as intercostal block wears off  Objective: Vital signs in last 24 hours: Temp:  [97.7 F (36.5 C)-99.1 F (37.3 C)] 98 F (36.7 C) (07/08 0352) Pulse Rate:  [52-83] 72 (07/08 0352) Cardiac Rhythm: Normal sinus rhythm (07/07 2000) Resp:  [13-19] 14 (07/08 0352) BP: (106-151)/(56-73) 106/56 (07/08 0352) SpO2:  [91 %-98 %] 94 % (07/08 0352) Arterial Line BP: (132-140)/(52-80) 132/52 (07/07 1830) Weight:  [63.5 kg] 63.5 kg (07/07 1042)  Hemodynamic parameters for last 24 hours:    Intake/Output from previous day: 07/07 0701 - 07/08 0700 In: 2626.4 [P.O.:720; I.V.:1206.4; IV Piggyback:700] Out: 1486 [Urine:1205; Blood:200; Chest Tube:81] Intake/Output this shift: No intake/output data recorded.  General appearance: alert, cooperative, and no distress Heart: regular rate and rhythm Lungs: clear to auscultation bilaterally and + Sub Q air on left Abdomen: benign Extremities: no edema or calf tenderness Wound: incis healing well  Lab Results: Recent Labs    03/12/24 1637 03/13/24 0340  WBC  --  15.4*  HGB 10.9* 10.6*  HCT 32.0* 31.9*  PLT  --  219   BMET:  Recent Labs    03/12/24 1637 03/13/24 0340  NA 140 137  K 4.5 4.0  CL  --  105  CO2  --  24  GLUCOSE  --  214*  BUN  --  14  CREATININE  --  1.03  CALCIUM   --  8.3*    PT/INR: No results for input(s): LABPROT, INR in the last 72 hours. ABG    Component Value Date/Time   PHART 7.366 03/12/2024 1637   HCO3 25.5 03/12/2024 1637   TCO2 27 03/12/2024 1637   ACIDBASEDEF 2.0 03/12/2024 1508   O2SAT 99 03/12/2024 1637   CBG (last 3)  No results for input(s): GLUCAP in the last 72 hours.  Meds Scheduled Meds:  acetaminophen   1,000  mg Oral Q6H   Or   acetaminophen  (TYLENOL ) oral liquid 160 mg/5 mL  1,000 mg Oral Q6H   amLODipine   5 mg Oral Daily   aspirin  EC  81 mg Oral QODAY   bisacodyl   10 mg Oral Daily   Chlorhexidine  Gluconate Cloth  6 each Topical Daily   enoxaparin  (LOVENOX ) injection  40 mg Subcutaneous Daily   gabapentin   300 mg Oral QHS   Followed by   NOREEN ON 03/15/2024] gabapentin   300 mg Oral BID   ipratropium-albuterol   3 mL Nebulization Q6H   ketorolac   15 mg Intravenous Q6H   pantoprazole   40 mg Oral Daily   [START ON 03/14/2024] rosuvastatin   10 mg Oral QODAY   senna-docusate  1 tablet Oral QHS   sodium chloride  flush  10-40 mL Intracatheter Q12H   Continuous Infusions:  dextrose  5 % and 0.9 % NaCl Stopped (03/13/24 0334)   PRN Meds:.morphine  injection, ondansetron  (ZOFRAN ) IV, oxyCODONE , sodium chloride  flush, traMADol   Xrays DG Chest Port 1 View Result Date: 03/12/2024 CLINICAL DATA:  Post lobectomy EXAM: PORTABLE CHEST 1 VIEW COMPARISON:  03/08/2024, CT 02/29/2024 FINDINGS: Interval placement of left-sided chest tube with tip at the apex. Patient is status post left upper lobectomy with sutures in the peripheral left upper lung at the site of prior cavitary lesion, focal air collection in the region of postsurgical changes noted. A bandlike atelectasis in the right mid and  lower lung. Stable cardiomediastinal silhouette. Air in the soft tissues of left chest wall. Probable partial left fourth and fifth rib resections. IMPRESSION: 1. Postsurgical changes of the left upper lung with interim placement of left-sided chest tube. Air containing resection cavity near the surgical sutures in the left upper lung. Focal air collection in the region of postsurgical changes in the left upper lung, attention on follow-up. 2. Bandlike atelectasis in the right mid and lower lung. Electronically Signed   By: Luke Bun M.D.   On: 03/12/2024 22:11    Assessment/Plan: S/P Procedure(s) (LRB): LOBECTOMY, LUNG,  ROBOT-ASSISTED, USING VATS (Left) THORACOTOMY, MAJOR (Left) RECONSTRUCTION, MAJOR, CHEST WALL (Left) BLOCK, NERVE, INTERCOSTAL (Left) LYMPH NODE BIOPSY (Left) POD#1  1 Tmax 99.1, s BP 100's-150's,mostly in low normal range- follow as may be able to restart norvasc , but don't want to make hypotensive.  Sinus rhythm, some vent bigeminy, will need to consider beta blocker if persists and lytes all ok . Has normal LVEF and RV functon on recent echo. Preop EKG showed Poss ant infarct - age indetermined 2 O2 sats good on RA 3 good UOP 4 CT- 81 ml recorded , no air leak noted- leave tube for now w/ Sub Q air, on water seal 5 normal renal fxn by BUN/Creat, K+ ok, will check MG++ 6 leukocytosis, WBC 15.4, likely reactive 7 expected ABLA, stable H/H at 10/31 8 CXR- mod space on left, Left basilar ASD/ATX, some sub Q air on left 9 lovenox  for DVT ppx 10 routine pulm hygiene, rehab modalities 11 reduce IVF  LOS: 1 day    Lemond FORBES Cera PA-C Pager [phone] 03/13/2024 Patient seen and examined, agree with above No air leak but has increased space on CXR- will check tube, connections, etc Place on suction while in bed/ chair Ambulate SCD + enoxaparin   Elspeth C. Kerrin, MD [facility] [phone]

## 2024-03-13 NOTE — Op Note (Signed)
 Russell [name], PAYNE MEDICAL RECORD NO: [id number] ACCOUNT NO: [id number] DATE OF BIRTH: Jan 12, 1951 FACILITY: [facility] LOCATION: [facility] PHYSICIAN: Elspeth BROCKS. 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  POSTOPERATIVE DIAGNOSIS: Squamous cell carcinoma of the lung, clinical stage IIB (T3, N0).  PROCEDURE:  Xi robotic-assisted left upper lobectomy with en bloc resection of chest wall including fourth and fifth ribs and wedge of superior segment of left lower lobe.  Lymph node dissection and  Intercostal nerve blocks levels 3 through 10.  SURGEON: Elspeth BROCKS. Kerrin, MD.  ASSISTANT: Con [place], PA.    Lemond Cera, [place]  Experienced assistance was necessary for this case due to surgical complexity. Lemond Cera and then [employer] assisted with port placement, robot docking and undocking, instrument exchange, specimen retrieval, suctioning, retraction, and wound closure.  ANESTHESIA: General.  FINDINGS: Mass in left upper lobe with broad-based attachment to chest wall. No clear plane allowing for extrapleural lobectomy. Therefore, a small portion of the chest wall was resected along with en bloc with tumor.  Possible invasion across fissure, small portion of superior segment of left lower lobe resected en bloc with specimen.  Numerous enlarged but otherwise benign-appearing lymph nodes.  CLINICAL NOTE: Russell [name] is a 73 year old man with a history of tobacco abuse who was found to have a left upper lobe lung lesion in 2022. Over time, the lesion has increased in size and wall thickness. He has had a PET CT which showed hypermetabolic with an SUV of 3.5. There was no evidence of adenopathy. A repeat CT showed again increased wall thickness and then that was noted again on another CT. A repeat PET showed an increase in SUV to 12. He underwent Robotic bronchoscopy and biopsy  showed  squamous cell carcinoma. He was also experiencing left-sided chest pain concerning for chest wall invasion. He was offered surgical resection but informed of the high likelihood of need for additional treatment. The indications, risks, benefits, and alternatives were discussed in detail with the patient. He understood there was a significant chance of conversion to thoracotomy for resection of chest wall. He has accepted the risks and agreed to proceed.  OPERATIVE NOTE: Russell [name] was brought to the operating room on 03/12/2024. He has had induction of general anesthesia and was intubated with a double-lumen endotracheal tube. Intravenous antibiotics were administered. A Foley catheter was placed.  Sequential compression devices were placed on the cast for DVT prophylaxis. Intravenous antibiotics were administered. He was placed in a right lateral decubitus position. A Bair Hugger was placed for active warming. The left chest was prepped and draped in the usual sterile fashion. Single lung ventilation of the right lung was initiated and was tolerated well throughout the procedure.  A time-out was performed. A solution containing 20 mL of liposomal bupivacaine , 30 mL of 0.5% bupivacaine , and 50 mL of saline was prepared. The solution was used for local as well as intercostal nerve blocks. An incision was made in the eighth interspace in the midaxillary line. An 8-mm robotic port was inserted. There was a small pleural tear of the lower lobe that was later stapled. Once intrapleural placement was confirmed, carbon dioxide was insufflated per protocol. A 12-mm  robotic port was placed in the eighth interspace anterior to the camera port. Inspection of the chest showed the mass was adherent to the lateral chest wall. Intercostal nerve blocks were performed from the  third to the tenth interspace by injecting 10 mL of the bupivacaine  solution into a subpleural plane at each level. A 12-mm AirSeal port was  placed in the 10th interspace posterolaterally. Two additional robotic ports were placed in the 8th interspace. The robot was deployed. The camera arm was docked. Targeting was performed. The remaining arms were docked. The robotic instruments were inserted with thoracoscopic visualization.  The dissection was begun on the chest wall and a plane was developed in the pleura well away from the mass. There was a relatively large fat pad, which was removed and sent as a separate specimen mostly to avoid later confusion on CT scanning. As the dissection progressed, it was clear that there was not a clear plane of normal tissue around the fourth and fifth ribs laterally and it would be necessary to do a chest wall resection along with that. It was also clear that there was probable invasion across the fissure into the superior segment of the lower lobe. The robotic stapler was used to perform a wedge resection on the superior segment of the lower lobe leaving that en bloc with the tumor. Inspection of the fissure revealed it was relatively complete. The decision was made to do as much of the dissection as possible robotically before doing the chest wall portion, which might also be possible to do robotically. The lower lobe was retracted superiorly and the inferior ligament was divided with bipolar cautery. Level 9 node was removed working posteriorly. Pleural reflection was divided at the hilum and a level 7 node was removed. There were also a level 10 and a second level 7 node that were removed. A 4L node was removed. Working superiorly, the level 5 nodes could not be approached from this direction due to the mass being adherent to the chest wall. Next, the fissure was dissected free. It did not require any stapling. The pleura overlying the pulmonary artery was incised and the pulmonary artery was dissected out. Nodes were removed from around the branches. The lingular branch was encircled and divided using the  robotic stapler and then the posterior ascending branch was encircled and divided. The remaining anterior and  apical branches could not be approached at this time. The lung was retracted posteriorly. The pleural reflection was divided to the hilum anteriorly. Level 11 nodes were removed at the bifurcation of the upper and lower lobe bronchus. The superior pulmonary vein was encircled and was divided using the robotic stapler. At this point, the bronchus and anterior apical segmental branches were dissected out, but there was not a good angle for stapling using the robotic stapler and the decision was made to proceed with the chest wall portion of the procedure.   A small mini-thoracotomy incision was performed just anterior to the scapula in the axillary area. A portion of the latissimus muscle was divided. The serratus was retracted anteriorly and the pleural space was entered one interspace below the tumor. Portions of the fourth and fifth ribs then were resected anteriorly and posteriorly. The remainder of the attachments were taken down and then the chest wall was allowed to fall into the chest. The remaining pulmonary artery branches were divided using a Covidien stapler. The stapler was then placed across the left upper lobe bronchus and closed. The test inflation showed good aeration of the lower lobe. The stapler was fired transecting the bronchus. The specimen was placed into a 15-mm endoscopic retrieval bag and removed through the mini-thoracotomy incision and sent for frozen  section of the margins. The chest wall margins were marked and also the bronchial margin was inspected. All of those margins were turned free of tumor. The chest was copiously irrigated with saline. Test inflation revealed minimal leakage from the fissure. No leakage from the bronchial stump. ProGel was later applied to that area. An OviTex 2S patch was then cut  to fit the size of the chest wall defect and sewn in with  interrupted #1 Vicryl sutures. The wound was copiously irrigated. The muscular layers were closed. The ports were reinserted and the scope was placed back into the chest. All staple lines and chest wall were inspected for hemostasis. A 28-French Blake drain was placed through the anterior-most port incision and directed to the apex. It was secured with a #1 silk suture. Dual lung ventilation was resumed. The remaining incisions were closed in standard fashion. All sponge, needle, and instrument counts were correct at the end of the procedure. The patient was placed back in a supine position. The chest tube was placed to Pleur-evac on waterseal. The patient then was extubated in the operating room and taken to the post-anesthesia care unit in good condition.    SUJ D: 03/12/2024 6:49:08 pm T: 03/13/2024 12:14:00 am  JOB: [id number]/ [id number]

## 2024-03-14 ENCOUNTER — Inpatient Hospital Stay (HOSPITAL_COMMUNITY)

## 2024-03-14 ENCOUNTER — Encounter (HOSPITAL_COMMUNITY): Payer: Self-pay | Admitting: Thoracic Surgery (Cardiothoracic Vascular Surgery)

## 2024-03-14 LAB — COMPREHENSIVE METABOLIC PANEL WITH GFR
ALT: 11 U/L (ref 0–44)
AST: 25 U/L (ref 15–41)
Albumin: 2.7 g/dL — ABNORMAL LOW (ref 3.5–5.0)
Alkaline Phosphatase: 38 U/L (ref 38–126)
Anion gap: 6 (ref 5–15)
BUN: 19 mg/dL (ref 8–23)
CO2: 24 mmol/L (ref 22–32)
Calcium: 7.9 mg/dL — ABNORMAL LOW (ref 8.9–10.3)
Chloride: 105 mmol/L (ref 98–111)
Creatinine, Ser: 1 mg/dL (ref 0.61–1.24)
GFR, Estimated: >60 mL/min (ref >60)
Glucose, Bld: 112 mg/dL — ABNORMAL HIGH (ref 70–99)
Potassium: 4.3 mmol/L (ref 3.5–5.1)
Sodium: 135 mmol/L (ref 135–145)
Total Bilirubin: 0.5 mg/dL (ref 0.0–1.2)
Total Protein: 5.2 g/dL — ABNORMAL LOW (ref 6.5–8.1)

## 2024-03-14 LAB — CBC
HCT: 30.2 % — ABNORMAL LOW (ref 39.0–52.0)
Hemoglobin: 9.9 g/dL — ABNORMAL LOW (ref 13.0–17.0)
MCH: 31.4 pg (ref 26.0–34.0)
MCHC: 32.8 g/dL (ref 30.0–36.0)
MCV: 95.9 fL (ref 80.0–100.0)
Platelets: 210 K/uL (ref 150–400)
RBC: 3.15 MIL/uL — ABNORMAL LOW (ref 4.22–5.81)
RDW: 13.3 % (ref 11.5–15.5)
WBC: 13.4 K/uL — ABNORMAL HIGH (ref 4.0–10.5)
nRBC: 0 % (ref 0.0–0.2)

## 2024-03-14 LAB — SURGICAL PATHOLOGY

## 2024-03-14 NOTE — Plan of Care (Signed)
  Problem: Clinical Measurements: Goal: Ability to maintain clinical measurements within normal limits will improve Outcome: Progressing Goal: Respiratory complications will improve Outcome: Progressing   Problem: Activity: Goal: Risk for activity intolerance will decrease Outcome: Progressing   Problem: Nutrition: Goal: Adequate nutrition will be maintained Outcome: Progressing   Problem: Pain Managment: Goal: General experience of comfort will improve and/or be controlled Outcome: Progressing   Problem: Safety: Goal: Ability to remain free from injury will improve Outcome: Progressing

## 2024-03-14 NOTE — Progress Notes (Signed)
 Mobility Specialist Progress Note;   03/14/24 0911  Mobility  Activity Ambulated with assistance in hallway  Level of Assistance Standby assist, set-up cues, supervision of patient - no hands on  Assistive Device Front wheel walker  Distance Ambulated (ft) 300 ft  Activity Response Tolerated well  Mobility Referral Yes  Mobility visit 1 Mobility  Mobility Specialist Start Time (ACUTE ONLY) 0911  Mobility Specialist Stop Time (ACUTE ONLY) [id number]  Mobility Specialist Time Calculation (min) (ACUTE ONLY) 12 min   Pt agreeable to mobility. Required no physical assistance during ambulation, SV. No leakage this session. VSS throughout and no c/o when asked. Pt returned back to chair with all needs met, call bell in reach.   Lauraine Erm Mobility Specialist Please contact via [employer] or [employer] [phone]

## 2024-03-14 NOTE — Progress Notes (Addendum)
 2 Days Post-Op Procedure(s) (LRB): LOBECTOMY, LUNG, ROBOT-ASSISTED, USING VATS (Left) THORACOTOMY, MAJOR (Left) RECONSTRUCTION, MAJOR, CHEST WALL (Left) BLOCK, NERVE, INTERCOSTAL (Left) LYMPH NODE BIOPSY (Left) Subjective: Some left sided soreness but tolerable, has had some drainage around CT requiring dressing changes, not SOB, no nausea  Objective: Vital signs in last 24 hours: Temp:  [97.5 F (36.4 C)-98.5 F (36.9 C)] 97.8 F (36.6 C) (07/09 0417) Pulse Rate:  [64-94] 64 (07/09 0417) Cardiac Rhythm: Normal sinus rhythm (07/08 1948) Resp:  [15-20] 16 (07/09 0417) BP: (111-143)/(44-75) 114/64 (07/09 0417) SpO2:  [92 %-97 %] 92 % (07/09 0417)  Hemodynamic parameters for last 24 hours:    Intake/Output from previous day: 07/08 0701 - 07/09 0700 In: 250 [P.O.:240; I.V.:10] Out: 670 [Urine:325; Chest Tube:345] Intake/Output this shift: No intake/output data recorded.  General appearance: alert, cooperative, and no distress Heart: regular rate and rhythm Lungs: min dim in left base, o/w clear, + left sided Sub q air Abdomen: benign Extremities: no edema or calf tenderness Wound: incis healing well, CT dressing currently dry  Lab Results: Recent Labs    03/13/24 0340 03/14/24 0445  WBC 15.4* 13.4*  HGB 10.6* 9.9*  HCT 31.9* 30.2*  PLT 219 210   BMET:  Recent Labs    03/13/24 0340 03/14/24 0445  NA 137 135  K 4.0 4.3  CL 105 105  CO2 24 24  GLUCOSE 214* 112*  BUN 14 19  CREATININE 1.03 1.00  CALCIUM  8.3* 7.9*    PT/INR: No results for input(s): LABPROT, INR in the last 72 hours. ABG    Component Value Date/Time   PHART 7.366 03/12/2024 1637   HCO3 25.5 03/12/2024 1637   TCO2 27 03/12/2024 1637   ACIDBASEDEF 2.0 03/12/2024 1508   O2SAT 99 03/12/2024 1637   CBG (last 3)  No results for input(s): GLUCAP in the last 72 hours.  Meds Scheduled Meds:  acetaminophen   1,000 mg Oral Q6H   Or   acetaminophen  (TYLENOL ) oral liquid 160 mg/5 mL   1,000 mg Oral Q6H   amLODipine   5 mg Oral Daily   aspirin  EC  81 mg Oral QODAY   bisacodyl   10 mg Oral Daily   Chlorhexidine  Gluconate Cloth  6 each Topical Daily   enoxaparin  (LOVENOX ) injection  40 mg Subcutaneous Daily   gabapentin   300 mg Oral QHS   Followed by   NOREEN ON 03/15/2024] gabapentin   300 mg Oral BID   ketorolac   15 mg Intravenous Q6H   pantoprazole   40 mg Oral Daily   rosuvastatin   10 mg Oral QODAY   senna-docusate  1 tablet Oral QHS   sodium chloride  flush  10-40 mL Intracatheter Q12H   Continuous Infusions: PRN Meds:.ipratropium-albuterol , morphine  injection, ondansetron  (ZOFRAN ) IV, oxyCODONE , sodium chloride  flush, traMADol   Xrays DG Chest Port 1 View Result Date: 03/13/2024 CLINICAL DATA:  Status post lung resection EXAM: PORTABLE CHEST 1 VIEW COMPARISON:  03/12/2024 FINDINGS: The lungs appear hyperinflated, mildly progressive since prior examination, in keeping with changes of underlying COPD. Surgical changes of left upper lobectomy are identified. Left chest tube is in place. Despite this, left apical pneumothorax appears enlarged. There is increasing left chest wall and subpleural subcutaneous gas. Left subclavian central venous catheter with its tip within the expected rectus cephalic vein appears slightly retracted since prior examination. No pleural effusion. No pneumothorax on the right. No significant mediastinal shift. Cardiac size within normal limits. Discoid atelectasis within the right mid lung zone unchanged. IMPRESSION: 1. Enlarging  left apical pneumothorax with left chest tube in place. Increasing left chest wall and subpleural subcutaneous gas. 2. Progressive hyperinflation of the lungs, in keeping with COPD. Electronically Signed   By: Dorethia Molt M.D.   On: 03/13/2024 10:02   DG Chest Port 1 View Result Date: 03/12/2024 CLINICAL DATA:  Post lobectomy EXAM: PORTABLE CHEST 1 VIEW COMPARISON:  03/08/2024, CT 02/29/2024 FINDINGS: Interval placement of  left-sided chest tube with tip at the apex. Patient is status post left upper lobectomy with sutures in the peripheral left upper lung at the site of prior cavitary lesion, focal air collection in the region of postsurgical changes noted. A bandlike atelectasis in the right mid and lower lung. Stable cardiomediastinal silhouette. Air in the soft tissues of left chest wall. Probable partial left fourth and fifth rib resections. IMPRESSION: 1. Postsurgical changes of the left upper lung with interim placement of left-sided chest tube. Air containing resection cavity near the surgical sutures in the left upper lung. Focal air collection in the region of postsurgical changes in the left upper lung, attention on follow-up. 2. Bandlike atelectasis in the right mid and lower lung. Electronically Signed   By: Luke Bun M.D.   On: 03/12/2024 22:11    Assessment/Plan: S/P Procedure(s) (LRB): LOBECTOMY, LUNG, ROBOT-ASSISTED, USING VATS (Left) THORACOTOMY, MAJOR (Left) RECONSTRUCTION, MAJOR, CHEST WALL (Left) BLOCK, NERVE, INTERCOSTAL (Left) LYMPH NODE BIOPSY (Left)   POD#2  1 afeb, s BP 100's-140's, sinus rhythm, some PVC's 2 O2 sats good on RA 3 CT 337ml/24 H recorded- very small air leak w/ cough, keep to suction for now 4 voiding- not measured 5 CXR- + sub q air, mod left basilar ASD/Atx, lung appears more expanded 6 normal renal fxn, lytes ok 7 minor reactive leukocytosis, trend improving 8 H/H holding fairly steady 9.9/30.2 9 path pending 10 lovenox  for DVT ppx 11 push rehab and pulm hygiene as able   LOS: 2 days    Lemond FORBES Cera PA-C Pager [phone]   03/14/2024 Patient seen and examined, agree with above Has a small air leak and still has a space on CXR although it is decreased Keep CT on suction today Pain well controlled Continue ambulation  Elspeth C. Kerrin, MD [facility] [phone]

## 2024-03-15 ENCOUNTER — Inpatient Hospital Stay (HOSPITAL_COMMUNITY)

## 2024-03-15 NOTE — Progress Notes (Signed)
 Mobility Specialist Progress Note;   03/15/24 0954  Mobility  Activity Ambulated with assistance in hallway;Ambulated with assistance to bathroom  Level of Assistance Standby assist, set-up cues, supervision of patient - no hands on  Assistive Device Front wheel walker  Distance Ambulated (ft) 800 ft  Activity Response Tolerated well  Mobility Referral Yes  Mobility visit 1 Mobility  Mobility Specialist Start Time (ACUTE ONLY) [id number]  Mobility Specialist Stop Time (ACUTE ONLY) 1011  Mobility Specialist Time Calculation (min) (ACUTE ONLY) 17 min   Pt eager for mobility. Required no physical assistance during ambulation, SV. VSS throughout and no c/o when asked. Pt returned to chair with all needs met, call bell in reach.   Lauraine Erm Mobility Specialist Please contact via SecureChat or [employer] [phone]

## 2024-03-15 NOTE — Discharge Summary (Addendum)
 Physician Discharge Summary                [address]               Thurmon BROCKS [place], [place] [address]                      [phone]   Patient ID: Russell [name] MRN: [id number] DOB/AGE: [birth year] 73 y.o.  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   POSTOPERATIVE DIAGNOSIS: Squamous cell carcinoma of the lung, clinical stage IIB (T3, N0).   PROCEDURE: Xi robotic-assisted left upper lobectomy with en bloc resection of chest wall including third and fourth ribs and wedge of superior segment of left lower lobe. Lymph node dissection and intercostal nerve blocks levels 3 through 10.   SURGEON: Elspeth BROCKS. Kerrin, MD.   ASSISTANT: Con Bend, PA   PCP is Zollie Lowers, MD Referring Provider is Ruthell Lauraine FALCON, NP    HPI: At time of CT surgical consultation   Mr. Kirchgessner is sent for consultation regarding a squamous cell carcinoma of the left upper lobe.   PCP is Zollie Lowers, MD Referring Provider is Ruthell Lauraine FALCON, NP   Russell [name] is a 73 year old man with a history of tobacco abuse, COPD, hypertension, hyperlipidemia, coronary disease, hiatal hernia, reflux, and newly diagnosed squamous cell carcinoma of the lung.   Smoked about a pack a day for over 50 years prior to quitting on May 10.  Has been in the low-dose CT for lung cancer screening program several years.  He was noted to have a cystic left upper lobe lesion in 2022.  Over time it has  grown larger and increased wall thickening.  Had a PET/CT in September 2024 which showed SUV of 3.5.  A CT in January 2025 showed the nodule measured 2.7 x 3.5 cm, but had increased wall thickness compared to his prior study.  Underwent navigational bronchoscopy which showed atypical cells.  Repeat CT in April showed further increase in wall thickness and increase in size to 3.2 x 4.7 cm.  No mediastinal or hilar adenopathy.  PET/CT showed an increase in SUV to 12.  Underwent robotic bronchoscopy again and biopsy showed squamous cell carcinoma.   Has had a cough with hemoptysis since his biopsy.  Gets short of breath with heavy exertion but can walk a mile without stopping.  Can walk up 2 flights of stairs without stopping.  No chest pain, pressure, or tightness.  He does bruise easily.  No weight loss over the past 3 months.  Appetite is good.  Dr. Kerrin evaluated the patient and all relevant studies and recommended proceeding with surgical resection.  Hospital course: Patient was admitted on 03/12/2024 at which time he was taken the operating room and underwent a robotic assisted left upper lobectomy with chest wall resection.  Patient tolerated the procedure well was taken to the postanesthesia care unit in stable condition.  Postoperative hospital course:  On postop day 1 the patient was doing  well.  He maintained good oxygen saturations on room air.  Chest x-ray revealed a moderate space and chest tube was placed to 20 cm water suction.  This was gradually transition to waterseal.  He has had a small airleak and significant subcutaneous air noted on chest x-ray.  This was associated with a moderate size space.  Clinically he has improved over time .  He does have an expected acute blood loss anemia which is minor and stable.  Renal function is noted to be normal.  He is making good urine output.  He did have a mild leukocytosis which is likely reactive in nature.  He developed postoperative atrial  fibrillation with RVR.  He was started on amiodarone  with conversion back to sinus rhythm.  Amiodarone  was transitioned to the oral form.  He maintained sinus rhythm.  Potassium and magnesium are in normal range.  Incisions are all noted to be healing well without evidence of infection.  He is tolerating gradually increasing activities using standard post operatory rehabilitation protocols.  He is doing usual pulmonary hygiene maneuvers.  He was kept on Lovenox  for DVT prophylaxis. Small airleak persistent so we transitioned from the standard Pleur-evac to an Atrium Mini Express.  The patient was instructed in the use and care of this device including how to drain the collection chamber when needed.  We also requested home health nursing for assistance with managing the chest tube and collection system at home.  We have arranged for follow-up in the office in 4 days with a chest x-ray.   Latest Vital Signs: Blood pressure (!) 152/72, pulse 65, temperature 97.8 F (36.6 C), temperature source Oral, resp. rate 19, height 5' 8 (1.727 m), weight 63.5 kg, SpO2 95%.  Physical Exam: General appearance: alert, cooperative, and no distress Neurologic: intact Heart: no further atrial fibrillation. Maintaining SR ~58-70/min. Was in SB in the low 50's pre-op.  Lungs: clear, diminished on the left. Small air leak with cough. CT drainage for past 24 hours.  Wound: Port incisions are dry, the chest tube is secure.   Discharge Condition: Stable  Recent laboratory studies:  Lab Results  Component Value Date   WBC 10.7 (H) 03/17/2024   HGB 10.6 (L) 03/17/2024   HCT 31.9 (L) 03/17/2024   MCV 94.7 03/17/2024   PLT 261 03/17/2024   Lab Results  Component Value Date   NA 135 03/18/2024   K 4.3 03/18/2024   CL 102 03/18/2024   CO2 26 03/18/2024   CREATININE 0.76 03/18/2024   GLUCOSE 103 (H) 03/18/2024      Diagnostic Studies: DG Chest Port 1 View Result Date: 03/18/2024 CLINICAL DATA:  Status  post lobectomy. EXAM: PORTABLE CHEST 1 VIEW COMPARISON:  03/17/2024 FINDINGS: Left chest tube remains in place with stable to minimal decrease in left pneumothorax. Soft tissue gas in the left chest wall is similar to prior. Right lung is hyperexpanded but clear. Similar appearance of pneumomediastinum. The cardiopericardial silhouette is within normal limits for size. Left subclavian central line tip projects in the region of the innominate vein confluence. Telemetry leads overlie the chest. IMPRESSION: 1. Stable to minimal decrease in left pneumothorax with left chest tube in place. 2. Similar appearance of pneumomediastinum. 3. Left subclavian central line tip projects in the region of the innominate vein confluence. Electronically Signed   By: Camellia Candle M.D.   On: 03/18/2024 08:34   DG Chest 2 View Result Date: 03/17/2024 CLINICAL DATA:  [id number] Status post lobectomy of  lung [id number] Follow-up exam [id number] EXAM: CHEST - 2 VIEW COMPARISON:  March 16, 2024 FINDINGS: The cardiomediastinal silhouette is unchanged in contour.LEFT subclavian CVC tip terminates over the confluence of the LEFT brachiocephalic vein and SVC. LEFT-sided chest tube. Similar high density along the LEFT lateral lung, likely loculated fluid. Similar moderate LEFT pneumothorax. Extensive LEFT-sided subcutaneous air. No acute pleuroparenchymal abnormality. Mild multilevel degenerative changes of the thoracic spine. Visualized abdomen is unremarkable. IMPRESSION: Similar moderate LEFT pneumothorax with chest tube in place. Electronically Signed   By: Corean Salter M.D.   On: 03/17/2024 10:08   DG Chest 1 View Result Date: 03/16/2024 CLINICAL DATA:  Status post left upper lobe resection. EXAM: CHEST  1 VIEW COMPARISON:  Prior chest x-ray yesterday 03/15/2024 FINDINGS: Stable position of left subclavian approach central venous catheter. Catheter tip overlies the confluence of the left innominate vein and the SVC. Left-sided chest  tube remains in place. Persistent large left pneumothorax which may be slightly larger on today's examination. Persistent subcutaneous emphysema along the soft tissues of the left neck and left chest. The right lung remains well aerated. IMPRESSION: 1. Similar to slightly worsened moderately large left pneumothorax with chest tube in place. 2. Stable position of left subclavian central venous catheter. 3. Persistent extensive subcutaneous emphysema in the soft tissues of the left neck and chest. Electronically Signed   By: Wilkie Lent M.D.   On: 03/16/2024 08:28   DG Chest 1 View Result Date: 03/15/2024 CLINICAL DATA:  Postop day 3 status post left upper lobectomy and chest wall reconstruction EXAM: CHEST  1 VIEW COMPARISON:  03/14/2024 FINDINGS: Left subclavian central line tip: Brachiocephalic vein. Left chest tube in place with substantial left subcutaneous emphysema. Atherosclerotic calcification of the aortic arch. Pulmonary emphysema. Continued volume loss in the left hemithorax with reduced size of the moderate left pneumothorax. Bandlike opacities in the left lower lobe probably from atelectasis. The right lung appears clear. Heart size within normal limits. Left rib deformities unchanged. IMPRESSION: 1. Reduced size of the moderate left pneumothorax with left chest tube in place. Substantial left subcutaneous emphysema. 2. Bandlike opacities in the left lower lobe probably from atelectasis. 3.  Emphysema (ICD10-J43.9). 4. Aortic Atherosclerosis (ICD10-I70.0). Electronically Signed   By: Ryan Salvage M.D.   On: 03/15/2024 08:18   DG Chest 1 View Result Date: 03/14/2024 CLINICAL DATA:  Status post lobectomy. EXAM: CHEST  1 VIEW COMPARISON:  03/13/2024 FINDINGS: Left chest tube remains in place with similar appearance of left-sided pneumothorax. Streaky density in the lung bases is compatible with atelectasis. Left subclavian central line tip overlies the right paramidline mediastinum, near  the expected location of the innominate vein confluence. Soft tissue gas in the left chest wall similar to prior. Telemetry leads overlie the chest. IMPRESSION: No significant interval change. Left chest tube remains in place with similar appearance of moderate left-sided pneumothorax. Electronically Signed   By: Camellia Candle M.D.   On: 03/14/2024 09:53   DG Chest Port 1 View Result Date: 03/13/2024 CLINICAL DATA:  Status post lung resection EXAM: PORTABLE CHEST 1 VIEW COMPARISON:  03/12/2024 FINDINGS: The lungs appear hyperinflated, mildly progressive since prior examination, in keeping with changes of underlying COPD. Surgical changes of left upper lobectomy are identified. Left chest tube is in place. Despite this, left apical pneumothorax appears enlarged. There is increasing left chest wall and subpleural subcutaneous gas. Left subclavian central venous catheter with its tip within the expected rectus cephalic vein appears slightly retracted since prior  examination. No pleural effusion. No pneumothorax on the right. No significant mediastinal shift. Cardiac size within normal limits. Discoid atelectasis within the right mid lung zone unchanged. IMPRESSION: 1. Enlarging left apical pneumothorax with left chest tube in place. Increasing left chest wall and subpleural subcutaneous gas. 2. Progressive hyperinflation of the lungs, in keeping with COPD. Electronically Signed   By: Dorethia Molt M.D.   On: 03/13/2024 10:02   DG Chest Port 1 View Result Date: 03/12/2024 CLINICAL DATA:  Post lobectomy EXAM: PORTABLE CHEST 1 VIEW COMPARISON:  03/08/2024, CT 02/29/2024 FINDINGS: Interval placement of left-sided chest tube with tip at the apex. Patient is status post left upper lobectomy with sutures in the peripheral left upper lung at the site of prior cavitary lesion, focal air collection in the region of postsurgical changes noted. A bandlike atelectasis in the right mid and lower lung. Stable cardiomediastinal  silhouette. Air in the soft tissues of left chest wall. Probable partial left fourth and fifth rib resections. IMPRESSION: 1. Postsurgical changes of the left upper lung with interim placement of left-sided chest tube. Air containing resection cavity near the surgical sutures in the left upper lung. Focal air collection in the region of postsurgical changes in the left upper lung, attention on follow-up. 2. Bandlike atelectasis in the right mid and lower lung. Electronically Signed   By: Luke Bun M.D.   On: 03/12/2024 22:11   CT Chest Wo Contrast Addendum Date: 03/09/2024 ADDENDUM REPORT: 03/09/2024 15:42 ADDENDUM: Stable 2.9 cm oval shaped low-density superficial structure along the left side of the back. This most likely represents an epidermal inclusion cyst. Electronically Signed   By: Juliene Balder M.D.   On: 03/09/2024 15:42   Result Date: 03/09/2024 CLINICAL DATA:  Squamous cell carcinoma in the left upper lobe. Evaluate the pulmonary mass. EXAM: CT CHEST WITHOUT CONTRAST TECHNIQUE: Multidetector CT imaging of the chest was performed following the standard protocol without IV contrast. RADIATION DOSE REDUCTION: This exam was performed according to the departmental dose-optimization program which includes automated exposure control, adjustment of the mA and/or kV according to patient size and/or use of iterative reconstruction technique. COMPARISON:  Chest CT 12/26/2023 FINDINGS: Cardiovascular: Atherosclerotic calcifications in thoracic aorta without aneurysm. Heart size is normal. Coronary artery calcifications. Mediastinum/Nodes: No significant lymph node enlargement in the mediastinum. Limited evaluation of the hilar areas without intravascular contrast. No axillary lymph node enlargement. Thyroid  tissue is unremarkable. No enlarged lymph nodes in the supraclavicular areas. Esophagus is unremarkable. Lungs/Pleura: Trachea and mainstem bronchi are patent. Mild centrilobular emphysema. Persistent  cavitary mass along the periphery of the left upper lobe that abuts the pleural surface in the lateral aspect of the left major fissure. The mass contains a large amount of gas and there is a new fiducial marker within the mass. Again noted are reticular densities and septal thickening around the mass. The mass measures 5.3 x 3.3 x 4.9 cm and previously measured 4.9 x 3.0 x 4.1 cm. Irregular soft tissue thickening along the anterolateral aspect of the mass. There could be a small amount of layering fluid within the cavitary mass. No pleural effusions. No new suspicious pulmonary nodules or masses. Upper Abdomen: Mild fullness in the right adrenal gland is unchanged. No discrete adrenal nodule or lesion. No acute abnormality visualized upper abdomen. Musculoskeletal: No acute bone abnormality. IMPRESSION: 1. Interval enlargement of the cavitary mass in the left upper lobe. 2. No new suspicious pulmonary nodules or masses. 3. Aortic Atherosclerosis (ICD10-I70.0) and Emphysema (ICD10-J43.9). Electronically  Signed: By: Juliene Balder M.D. On: 03/09/2024 15:26   DG Chest 2 View Result Date: 03/08/2024 EXAM: 2 VIEW(S) XRAY OF THE CHEST [id number] COMPARISON: 01/17/2024 CXR CLINICAL HISTORY: [id number] Pre-op chest exam [id number]. PRE-ADMIT FOR LEFT LUNG SURGERY,HX NODULE; ROVER FINDINGS: LUNGS AND PLEURA: Mildly increasing 5.5 cm cavitary mass in posterior left upper lobe with internal lucency, likely reflecting increased fluid. Fiducial marker noted in the upper left lung. Hyperinflated lungs. No pleural effusion. No pneumothorax. HEART AND MEDIASTINUM: Aortic calcification. No acute abnormality of the cardiac and mediastinal silhouettes. BONES AND SOFT TISSUES: No acute osseous abnormality. IMPRESSION: 1. Mildly increasing 5.5 cm cavitary mass in the posterior left upper lobe with internal lucency, likely reflecting increased fluid. Primary bronchogenic malignancy to be excluded. 2. Hyperinflated lungs suggesting COPD.  Electronically signed by: Selinda Blue MD 03/08/2024 11:35 AM EDT RP Workstation: [id number]    Results for orders placed or performed during the hospital encounter of 03/12/24 (from the past 72 hours)  Basic metabolic panel with GFR     Status: Abnormal   Collection Time: 03/17/24  5:49 AM  Result Value Ref Range   Sodium 137 135 - 145 mmol/L   Potassium 4.2 3.5 - 5.1 mmol/L   Chloride 102 98 - 111 mmol/L   CO2 27 22 - 32 mmol/L   Glucose, Bld 116 (H) 70 - 99 mg/dL    Comment: Glucose reference range applies only to samples taken after fasting for at least 8 hours.   BUN 14 8 - 23 mg/dL   Creatinine, Ser 9.20 0.61 - 1.24 mg/dL   Calcium  8.5 (L) 8.9 - 10.3 mg/dL   GFR, Estimated >39 >39 mL/min    Comment: (NOTE) Calculated using the CKD-EPI Creatinine Equation (2021)    Anion gap 8 5 - 15    Comment: Performed at [facility], [address] [place], [place] [address]  CBC     Status: Abnormal   Collection Time: 03/17/24  5:49 AM  Result Value Ref Range   WBC 10.7 (H) 4.0 - 10.5 K/uL   RBC 3.37 (L) 4.22 - 5.81 MIL/uL   Hemoglobin 10.6 (L) 13.0 - 17.0 g/dL   HCT 68.0 (L) 60.9 - 47.9 %   MCV 94.7 80.0 - 100.0 fL   MCH 31.5 26.0 - 34.0 pg   MCHC 33.2 30.0 - 36.0 g/dL   RDW 87.3 88.4 - 84.4 %   Platelets 261 150 - 400 K/uL   nRBC 0.0 0.0 - 0.2 %    Comment: Performed at [facility], [address], [place], [place] [address]  Basic metabolic panel with GFR     Status: Abnormal   Collection Time: 03/18/24  5:39 AM  Result Value Ref Range   Sodium 135 135 - 145 mmol/L   Potassium 4.3 3.5 - 5.1 mmol/L   Chloride 102 98 - 111 mmol/L   CO2 26 22 - 32 mmol/L   Glucose, Bld 103 (H) 70 - 99 mg/dL    Comment: Glucose reference range applies only to samples taken after fasting for at least 8 hours.   BUN 21 8 - 23 mg/dL   Creatinine, Ser 9.23 0.61 - 1.24 mg/dL   Calcium  8.4 (L) 8.9 - 10.3 mg/dL   GFR, Estimated >39 >39 mL/min    Comment: (NOTE) Calculated using the  CKD-EPI Creatinine Equation (2021)    Anion gap 7 5 - 15    Comment: Performed at [facility], [address] [place], [place] [address]         Discharge Medications: Allergies as of 03/19/2024   No Known Allergies      Medication List     TAKE these medications    amiodarone  200 MG tablet Commonly known as: PACERONE  Take 2 tablets (400 mg total) by mouth 2 (two) times daily. For 2 weeks then decrease the dose to 1 tablet (200mg ) once daily.   amLODipine  5 MG tablet Commonly known as: NORVASC  TAKE 1 TABLET EVERY DAY FOR BLOOD PRESSURE   ascorbic acid 500 MG tablet Commonly known as: VITAMIN C Take 500 mg by mouth daily. Takes every other day   aspirin  EC 81 MG tablet Take 81 mg by mouth every other day.   cyanocobalamin  1000 MCG tablet Commonly known as: VITAMIN B12 Take 1,000 mcg by mouth every other day.   gabapentin  300 MG capsule Commonly known as: NEURONTIN  Take 1 capsule (300 mg total) by mouth 2 (two) times daily.   olmesartan  40 MG tablet Commonly known as: BENICAR  Take 1 tablet (40 mg total) by mouth daily. For blood pressure   rosuvastatin  10 MG tablet Commonly known as: Crestor  Take 1 tablet (10 mg total) by mouth every other day.   traMADol  50 MG tablet Commonly known as: ULTRAM  Take 1 tablet (50 mg total) by mouth every 6 (six) hours as needed for up to 7 days for severe pain (pain score 7-10) (mild pain).   Vitamin D3 125 MCG (5000 UT) Caps Take 5,000 Units by mouth every other day.        Follow Up Appointments:  Follow-up Information     Kerrin Elspeth BROCKS, MD Follow up.   Specialty: Cardiothoracic Surgery Why: Please see discharge paperwork for details of follow-up appointment with Dr. Kerrin.  On the date prior to the appointment he will also obtain a chest x-ray in the same office building. Contact information: [address] [place] [address] [phone]                 Signed: Laurel JUDITHANN Nottingham 03/19/2024, 7:47 AM

## 2024-03-15 NOTE — Plan of Care (Signed)
  Problem: Activity: Goal: Risk for activity intolerance will decrease Outcome: Progressing   Problem: Nutrition: Goal: Adequate nutrition will be maintained Outcome: Progressing   Problem: Coping: Goal: Level of anxiety will decrease Outcome: Progressing   Problem: Elimination: Goal: Will not experience complications related to bowel motility Outcome: Progressing Goal: Will not experience complications related to urinary retention Outcome: Progressing   Problem: Pain Managment: Goal: General experience of comfort will improve and/or be controlled Outcome: Progressing   Problem: Safety: Goal: Ability to remain free from injury will improve Outcome: Progressing   Problem: Skin Integrity: Goal: Risk for impaired skin integrity will decrease Outcome: Progressing   Problem: Activity: Goal: Risk for activity intolerance will decrease Outcome: Progressing   Problem: Cardiac: Goal: Will achieve and/or maintain hemodynamic stability Outcome: Progressing   Problem: Respiratory: Goal: Respiratory status will improve Outcome: Progressing   Problem: Pain Management: Goal: Pain level will decrease Outcome: Progressing

## 2024-03-15 NOTE — Progress Notes (Addendum)
 3 Days Post-Op Procedure(s) (LRB): LOBECTOMY, LUNG, ROBOT-ASSISTED, USING VATS (Left) THORACOTOMY, MAJOR (Left) RECONSTRUCTION, MAJOR, CHEST WALL (Left) BLOCK, NERVE, INTERCOSTAL (Left) LYMPH NODE BIOPSY (Left) Subjective: Feels ok, pain mostly controlled, positional  Objective: Vital signs in last 24 hours: Temp:  [97.5 F (36.4 C)-98.5 F (36.9 C)] 97.6 F (36.4 C) (07/10 0714) Pulse Rate:  [56-101] 61 (07/10 0714) Cardiac Rhythm: Normal sinus rhythm (07/10 0700) Resp:  [16-18] 18 (07/10 0714) BP: (119-134)/(63-102) 119/73 (07/10 0714) SpO2:  [93 %-98 %] 95 % (07/10 0714)  Hemodynamic parameters for last 24 hours:    Intake/Output from previous day: 07/09 0701 - 07/10 0700 In: 960 [P.O.:960] Out: 1165 [Urine:840; Chest Tube:325] Intake/Output this shift: No intake/output data recorded.  General appearance: alert, cooperative, and no distress Heart: regular rate and rhythm Lungs: good air exchange, crackles from SQ air Abdomen: benign Extremities: no edema or calf tenderness Wound: incis healing well  Lab Results: Recent Labs    03/13/24 0340 03/14/24 0445  WBC 15.4* 13.4*  HGB 10.6* 9.9*  HCT 31.9* 30.2*  PLT 219 210   BMET:  Recent Labs    03/13/24 0340 03/14/24 0445  NA 137 135  K 4.0 4.3  CL 105 105  CO2 24 24  GLUCOSE 214* 112*  BUN 14 19  CREATININE 1.03 1.00  CALCIUM  8.3* 7.9*    PT/INR: No results for input(s): LABPROT, INR in the last 72 hours. ABG    Component Value Date/Time   PHART 7.366 03/12/2024 1637   HCO3 25.5 03/12/2024 1637   TCO2 27 03/12/2024 1637   ACIDBASEDEF 2.0 03/12/2024 1508   O2SAT 99 03/12/2024 1637   CBG (last 3)  No results for input(s): GLUCAP in the last 72 hours.  Meds Scheduled Meds:  acetaminophen   1,000 mg Oral Q6H   Or   acetaminophen  (TYLENOL ) oral liquid 160 mg/5 mL  1,000 mg Oral Q6H   amLODipine   5 mg Oral Daily   aspirin  EC  81 mg Oral QODAY   bisacodyl   10 mg Oral Daily    Chlorhexidine  Gluconate Cloth  6 each Topical Daily   enoxaparin  (LOVENOX ) injection  40 mg Subcutaneous Daily   gabapentin   300 mg Oral BID   pantoprazole   40 mg Oral Daily   rosuvastatin   10 mg Oral QODAY   senna-docusate  1 tablet Oral QHS   sodium chloride  flush  10-40 mL Intracatheter Q12H   Continuous Infusions: PRN Meds:.ipratropium-albuterol , morphine  injection, ondansetron  (ZOFRAN ) IV, oxyCODONE , sodium chloride  flush, traMADol   Xrays DG Chest 1 View Result Date: 03/14/2024 CLINICAL DATA:  Status post lobectomy. EXAM: CHEST  1 VIEW COMPARISON:  03/13/2024 FINDINGS: Left chest tube remains in place with similar appearance of left-sided pneumothorax. Streaky density in the lung bases is compatible with atelectasis. Left subclavian central line tip overlies the right paramidline mediastinum, near the expected location of the innominate vein confluence. Soft tissue gas in the left chest wall similar to prior. Telemetry leads overlie the chest. IMPRESSION: No significant interval change. Left chest tube remains in place with similar appearance of moderate left-sided pneumothorax. Electronically Signed   By: Camellia Candle M.D.   On: 03/14/2024 09:53    Assessment/Plan: S/P Procedure(s) (LRB): LOBECTOMY, LUNG, ROBOT-ASSISTED, USING VATS (Left) THORACOTOMY, MAJOR (Left) RECONSTRUCTION, MAJOR, CHEST WALL (Left) BLOCK, NERVE, INTERCOSTAL (Left) LYMPH NODE BIOPSY (Left)  POD#3  1 afeb, VSS, sinus rhythm 2 sats god on RA 3 CT 325 ml/24h, poss small air leak- plan to put to H2O seal  today 4 voiding, not all measured 5 CXR appears pretty stable in terms of space and sub Q air 6 lovenox  for DVT ppx 7 cont pulm hygiene and rehab    LOS: 3 days    Lemond FORBES Cera PA-C Pager [phone] 03/15/2024 Patient seen and examined, agree with above Questionable air leak- mostly tidalling with a space but will keep tube today 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 Kerrin, MD [facility] [phone]

## 2024-03-16 ENCOUNTER — Inpatient Hospital Stay (HOSPITAL_COMMUNITY)

## 2024-03-16 MED ORDER — AMIODARONE HCL IN DEXTROSE 360-4.14 MG/200ML-% IV SOLN
60.0000 mg/h | INTRAVENOUS | Status: AC
  Administered 2024-03-16: 60 mg/h via INTRAVENOUS

## 2024-03-16 MED ORDER — AMIODARONE HCL IN DEXTROSE 360-4.14 MG/200ML-% IV SOLN
30.0000 mg/h | INTRAVENOUS | Status: AC
  Administered 2024-03-16 – 2024-03-17 (×3): 30 mg/h via INTRAVENOUS
  Filled 2024-03-16 (×4): qty 200

## 2024-03-16 MED ORDER — AMIODARONE LOAD VIA INFUSION
150.0000 mg | Freq: Once | INTRAVENOUS | Status: AC
  Administered 2024-03-16: 150 mg via INTRAVENOUS
  Filled 2024-03-16: qty 83.34

## 2024-03-16 NOTE — Progress Notes (Addendum)
 4 Days Post-Op Procedure(s) (LRB): LOBECTOMY, LUNG, ROBOT-ASSISTED, USING VATS (Left) THORACOTOMY, MAJOR (Left) RECONSTRUCTION, MAJOR, CHEST WALL (Left) BLOCK, NERVE, INTERCOSTAL (Left) LYMPH NODE BIOPSY (Left) Subjective: Conts to feel pretty well  Objective: Vital signs in last 24 hours: Temp:  [97.7 F (36.5 C)-98.5 F (36.9 C)] 98 F (36.7 C) (07/11 0330) Pulse Rate:  [67-74] 70 (07/11 0330) Cardiac Rhythm: Normal sinus rhythm (07/11 0700) Resp:  [15-19] 15 (07/11 0330) BP: (120-141)/(70-78) 120/71 (07/11 0330) SpO2:  [90 %-95 %] 90 % (07/11 0330)  Hemodynamic parameters for last 24 hours:    Intake/Output from previous day: 07/10 0701 - 07/11 0700 In: 1080 [P.O.:1080] Out: 1680 [Urine:1450; Chest Tube:230] Intake/Output this shift: No intake/output data recorded.  General appearance: alert, cooperative, and no distress Heart: regular rate and rhythm Lungs: mostly clear, difficult exam on left with sub q air crackles Abdomen: benign Extremities: no edema or calf tenderness Wound: incis healing well  Lab Results: Recent Labs    03/14/24 0445  WBC 13.4*  HGB 9.9*  HCT 30.2*  PLT 210   BMET:  Recent Labs    03/14/24 0445  NA 135  K 4.3  CL 105  CO2 24  GLUCOSE 112*  BUN 19  CREATININE 1.00  CALCIUM  7.9*    PT/INR: No results for input(s): LABPROT, INR in the last 72 hours. ABG    Component Value Date/Time   PHART 7.366 03/12/2024 1637   HCO3 25.5 03/12/2024 1637   TCO2 27 03/12/2024 1637   ACIDBASEDEF 2.0 03/12/2024 1508   O2SAT 99 03/12/2024 1637   CBG (last 3)  No results for input(s): GLUCAP in the last 72 hours.  Meds Scheduled Meds:  acetaminophen   1,000 mg Oral Q6H   Or   acetaminophen  (TYLENOL ) oral liquid 160 mg/5 mL  1,000 mg Oral Q6H   amLODipine   5 mg Oral Daily   aspirin  EC  81 mg Oral QODAY   bisacodyl   10 mg Oral Daily   Chlorhexidine  Gluconate Cloth  6 each Topical Daily   enoxaparin  (LOVENOX ) injection  40 mg  Subcutaneous Daily   gabapentin   300 mg Oral BID   pantoprazole   40 mg Oral Daily   rosuvastatin   10 mg Oral QODAY   senna-docusate  1 tablet Oral QHS   sodium chloride  flush  10-40 mL Intracatheter Q12H   Continuous Infusions: PRN Meds:.ipratropium-albuterol , morphine  injection, ondansetron  (ZOFRAN ) IV, oxyCODONE , sodium chloride  flush, traMADol   Xrays DG Chest 1 View Result Date: 03/15/2024 CLINICAL DATA:  Postop day 3 status post left upper lobectomy and chest wall reconstruction EXAM: CHEST  1 VIEW COMPARISON:  03/14/2024 FINDINGS: Left subclavian central line tip: Brachiocephalic vein. Left chest tube in place with substantial left subcutaneous emphysema. Atherosclerotic calcification of the aortic arch. Pulmonary emphysema. Continued volume loss in the left hemithorax with reduced size of the moderate left pneumothorax. Bandlike opacities in the left lower lobe probably from atelectasis. The right lung appears clear. Heart size within normal limits. Left rib deformities unchanged. IMPRESSION: 1. Reduced size of the moderate left pneumothorax with left chest tube in place. Substantial left subcutaneous emphysema. 2. Bandlike opacities in the left lower lobe probably from atelectasis. 3.  Emphysema (ICD10-J43.9). 4. Aortic Atherosclerosis (ICD10-I70.0). Electronically Signed   By: Ryan Salvage M.D.   On: 03/15/2024 08:18    Assessment/Plan: S/P Procedure(s) (LRB): LOBECTOMY, LUNG, ROBOT-ASSISTED, USING VATS (Left) THORACOTOMY, MAJOR (Left) RECONSTRUCTION, MAJOR, CHEST WALL (Left) BLOCK, NERVE, INTERCOSTAL (Left) LYMPH NODE BIOPSY (Left)  POD#4 1 afeb,  VSS, s BP 120's-140's, NSR 2 O2 sats good on RA 3 CT 230 ml/24h, + air leak 4 good UOP- not all measured 5 CXR, mod sized space on left , sub q air may be better 6 no new labs 7 lovenox  for DVT ppx 8 pulm hygiene and rehab as able 9 could consider clamping trial         LOS: 4 days    Lemond FORBES Cera PA-C Pager [phone] 03/16/2024 Does have a small air leak this AM Will keep tube to water seal Just went into A fib with RVR- 110-130 Will start IV amiodarone   Shauntea Lok C. Kerrin, MD [facility] [phone]

## 2024-03-16 NOTE — Care Management Important Message (Signed)
 Important Message  Patient Details  Name: Russell [name] MRN: [id number] Date of Birth: 02-09-1951   Important Message Given:  Yes - Medicare IM     Claretta Deed 03/16/2024, 4:12 PM

## 2024-03-16 NOTE — Plan of Care (Signed)

## 2024-03-17 ENCOUNTER — Inpatient Hospital Stay (HOSPITAL_COMMUNITY)

## 2024-03-17 LAB — BASIC METABOLIC PANEL WITH GFR
Anion gap: 8 (ref 5–15)
BUN: 14 mg/dL (ref 8–23)
CO2: 27 mmol/L (ref 22–32)
Calcium: 8.5 mg/dL — ABNORMAL LOW (ref 8.9–10.3)
Chloride: 102 mmol/L (ref 98–111)
Creatinine, Ser: 0.79 mg/dL (ref 0.61–1.24)
GFR, Estimated: >60 mL/min (ref >60)
Glucose, Bld: 116 mg/dL — ABNORMAL HIGH (ref 70–99)
Potassium: 4.2 mmol/L (ref 3.5–5.1)
Sodium: 137 mmol/L (ref 135–145)

## 2024-03-17 LAB — CBC
HCT: 31.9 % — ABNORMAL LOW (ref 39.0–52.0)
Hemoglobin: 10.6 g/dL — ABNORMAL LOW (ref 13.0–17.0)
MCH: 31.5 pg (ref 26.0–34.0)
MCHC: 33.2 g/dL (ref 30.0–36.0)
MCV: 94.7 fL (ref 80.0–100.0)
Platelets: 261 K/uL (ref 150–400)
RBC: 3.37 MIL/uL — ABNORMAL LOW (ref 4.22–5.81)
RDW: 12.6 % (ref 11.5–15.5)
WBC: 10.7 K/uL — ABNORMAL HIGH (ref 4.0–10.5)
nRBC: 0 % (ref 0.0–0.2)

## 2024-03-17 MED ORDER — AMIODARONE HCL 200 MG PO TABS
200.0000 mg | ORAL_TABLET | Freq: Once | ORAL | Status: AC
  Administered 2024-03-17: 200 mg via ORAL
  Filled 2024-03-17: qty 1

## 2024-03-17 MED ORDER — AMIODARONE HCL 200 MG PO TABS
400.0000 mg | ORAL_TABLET | Freq: Two times a day (BID) | ORAL | Status: DC
  Administered 2024-03-17 – 2024-03-19 (×4): 400 mg via ORAL
  Filled 2024-03-17 (×4): qty 2

## 2024-03-17 NOTE — Progress Notes (Addendum)
 5 Days Post-Op Procedure(s) (LRB): LOBECTOMY, LUNG, ROBOT-ASSISTED, USING VATS (Left) THORACOTOMY, MAJOR (Left) RECONSTRUCTION, MAJOR, CHEST WALL (Left) BLOCK, NERVE, INTERCOSTAL (Left) LYMPH NODE BIOPSY (Left) Subjective: Feels better this AM, some incisional pain  Objective: Vital signs in last 24 hours: Temp:  [97.6 F (36.4 C)-98.2 F (36.8 C)] 97.7 F (36.5 C) (07/12 0750) Pulse Rate:  [80-107] 92 (07/12 0750) Cardiac Rhythm: Atrial fibrillation (07/12 0700) Resp:  [16-19] 16 (07/12 0750) BP: (109-134)/(62-83) 124/80 (07/12 0750) SpO2:  [92 %-97 %] 94 % (07/12 0750)  Hemodynamic parameters for last 24 hours:    Intake/Output from previous day: 07/11 0701 - 07/12 0700 In: 360 [P.O.:360] Out: 890 [Urine:700; Chest Tube:190] Intake/Output this shift: Total I/O In: 360 [P.O.:360] Out: -   General appearance: alert, cooperative, and no distress Neurologic: intact Heart: regular rate and rhythm Lungs: diminished breath sounds left base + small air leak  Lab Results: Recent Labs    03/17/24 0549  WBC 10.7*  HGB 10.6*  HCT 31.9*  PLT 261   BMET:  Recent Labs    03/17/24 0549  NA 137  K 4.2  CL 102  CO2 27  GLUCOSE 116*  BUN 14  CREATININE 0.79  CALCIUM  8.5*    PT/INR: No results for input(s): LABPROT, INR in the last 72 hours. ABG    Component Value Date/Time   PHART 7.366 03/12/2024 1637   HCO3 25.5 03/12/2024 1637   TCO2 27 03/12/2024 1637   ACIDBASEDEF 2.0 03/12/2024 1508   O2SAT 99 03/12/2024 1637   CBG (last 3)  No results for input(s): GLUCAP in the last 72 hours.  Assessment/Plan: S/P Procedure(s) (LRB): LOBECTOMY, LUNG, ROBOT-ASSISTED, USING VATS (Left) THORACOTOMY, MAJOR (Left) RECONSTRUCTION, MAJOR, CHEST WALL (Left) BLOCK, NERVE, INTERCOSTAL (Left) LYMPH NODE BIOPSY (Left) POD # 5 In Sr this AM but intermittently has PACs and one brief < 5 sec run of A fib when I was in room Change amiodarone  to PO Still has a small air  leak, CXR unchanged with an apical space Keep CT to water seal SCD, ambulation, enoxaparin  for DVT prophylaxis   LOS: 5 days    Elspeth JAYSON Millers 03/17/2024

## 2024-03-17 NOTE — Plan of Care (Signed)

## 2024-03-17 NOTE — Progress Notes (Signed)
 Pt's HR sustaining between 56-59 BPM, attempted to notify Roddenberry, PA. Verbal order to administer only 200 mg of PO Amiodarone .   Lonell LITTIE Lyme, RN

## 2024-03-18 ENCOUNTER — Inpatient Hospital Stay (HOSPITAL_COMMUNITY)

## 2024-03-18 LAB — BASIC METABOLIC PANEL WITH GFR
Anion gap: 7 (ref 5–15)
BUN: 21 mg/dL (ref 8–23)
CO2: 26 mmol/L (ref 22–32)
Calcium: 8.4 mg/dL — ABNORMAL LOW (ref 8.9–10.3)
Chloride: 102 mmol/L (ref 98–111)
Creatinine, Ser: 0.76 mg/dL (ref 0.61–1.24)
GFR, Estimated: >60 mL/min (ref >60)
Glucose, Bld: 103 mg/dL — ABNORMAL HIGH (ref 70–99)
Potassium: 4.3 mmol/L (ref 3.5–5.1)
Sodium: 135 mmol/L (ref 135–145)

## 2024-03-18 NOTE — Progress Notes (Signed)
 Mobility Specialist Progress Note:    03/18/24 1134  Mobility  Activity Ambulated with assistance in hallway  Level of Assistance Standby assist, set-up cues, supervision of patient - no hands on  Assistive Device Front wheel walker  Distance Ambulated (ft) 500 ft  Activity Response Tolerated well  Mobility Referral Yes  Mobility visit 1 Mobility  Mobility Specialist Start Time (ACUTE ONLY) 1042  Mobility Specialist Stop Time (ACUTE ONLY) 1054  Mobility Specialist Time Calculation (min) (ACUTE ONLY) 12 min   Received pt in chair and agreeable for mobility. No physical assistance required. No c/o throughout. Returned pt to room and left in chair with personal belongings and call light within reach. All needs met.   Lavanda Pollack Mobility Specialist  Please contact via [employer] or  Rehab Office [phone]

## 2024-03-18 NOTE — Progress Notes (Addendum)
      [address]       [place] [address]             [phone]      6 Days Post-Op Procedure(s) (LRB): LOBECTOMY, LUNG, ROBOT-ASSISTED, USING VATS (Left) THORACOTOMY, MAJOR (Left) RECONSTRUCTION, MAJOR, CHEST WALL (Left) BLOCK, NERVE, INTERCOSTAL (Left) LYMPH NODE BIOPSY (Left) Subjective: Up in the bedside chair. Feels OK, pain controlled. Not short of breath on RA.   Objective: Vital signs in last 24 hours: Temp:  [97.7 F (36.5 C)-98 F (36.7 C)] 98 F (36.7 C) (07/13 0730) Pulse Rate:  [55-69] 63 (07/13 0730) Cardiac Rhythm: Normal sinus rhythm (07/13 0701) Resp:  [13-19] 19 (07/13 0730) BP: (123-161)/(65-77) 142/74 (07/13 0730) SpO2:  [95 %-97 %] 95 % (07/13 0730)     Intake/Output from previous day: 07/12 0701 - 07/13 0700 In: 1565.6 [P.O.:960; I.V.:605.6] Out: 780 [Urine:700; Chest Tube:80] Intake/Output this shift: No intake/output data recorded.  General appearance: alert, cooperative, and no distress Neurologic: intact Heart: no further atrial fibrillation. Maintaining SR ~58-70/min. Was in SB in the low 50's pre-op.  Lungs: clear, diminished on the left. Small air leak with cough. CT drainage 80ml for past 24 hours.  Wound: Port incisions are dry, the chest tube is secure.   Lab Results: Recent Labs    03/17/24 0549  WBC 10.7*  HGB 10.6*  HCT 31.9*  PLT 261   BMET:  Recent Labs    03/17/24 0549 03/18/24 0539  NA 137 135  K 4.2 4.3  CL 102 102  CO2 27 26  GLUCOSE 116* 103*  BUN 14 21  CREATININE 0.79 0.76  CALCIUM  8.5* 8.4*    PT/INR: No results for input(s): LABPROT, INR in the last 72 hours. ABG    Component Value Date/Time   PHART 7.366 03/12/2024 1637   HCO3 25.5 03/12/2024 1637   TCO2 27 03/12/2024 1637   ACIDBASEDEF 2.0 03/12/2024 1508   O2SAT 99 03/12/2024 1637   CBG (last 3)  No results for input(s): GLUCAP in the last 72 hours.  Assessment/Plan: S/P Procedure(s) (LRB): LOBECTOMY, LUNG,  ROBOT-ASSISTED, USING VATS (Left) THORACOTOMY, MAJOR (Left) RECONSTRUCTION, MAJOR, CHEST WALL (Left) BLOCK, NERVE, INTERCOSTAL (Left) LYMPH NODE BIOPSY (Left)  -Post-op day 6 robotic assisted left upper lobectomy for stage IIb squamous cell lung cancer with chest wall reconstruction.  Has had a small with persistent airleak.  Chest x-ray is stable with a left apical space and he is maintaining adequate oxygenation on room air.    - Postoperative atrial fibrillation: Converted back to sinus rhythm after IV amiodarone  loading.  He has been transitioned to oral amiodarone .  No further A-fib in the past 24 hours.  Will continue oral loading and taper the dose in the outpatient setting  -Disposition: Discussed with Dr. Kerrin.  Will transition the left pleural tube to an [facility] mini express today and plan for discharge tomorrow morning with a chest tube in place and will follow-up in the office next week with a chest x-ray.   LOS: 6 days    Myron G. Roddenberry, PA-C 03/18/2024  Patient seen and examined, still has a small air leak.  Plan as outlined above. Will anticoagulate if any additional atrial fib  Madeliene Tejera C. Kerrin, MD [facility] [phone]

## 2024-03-18 NOTE — Plan of Care (Signed)

## 2024-03-18 NOTE — Plan of Care (Signed)

## 2024-03-19 ENCOUNTER — Inpatient Hospital Stay (HOSPITAL_COMMUNITY)

## 2024-03-19 MED ORDER — AMIODARONE HCL 200 MG PO TABS: 400.0000 mg | ORAL_TABLET | Freq: Two times a day (BID) | ORAL | 1 refills | Status: DC

## 2024-03-19 MED ORDER — GABAPENTIN 300 MG PO CAPS: 300.0000 mg | ORAL_CAPSULE | Freq: Two times a day (BID) | ORAL | 1 refills | Status: DC

## 2024-03-19 MED ORDER — TRAMADOL HCL 50 MG PO TABS: 50.0000 mg | ORAL_TABLET | Freq: Four times a day (QID) | ORAL | 0 refills | Status: AC | PRN

## 2024-03-19 NOTE — TOC Transition Note (Signed)
 Transition of Care [facility]) - Discharge Note   Patient Details  Name: Russell [name] MRN: [id number] Date of Birth: [birth year]  [facility]) CM/SW Contact:  Roxie KANDICE Stain, RN Phone Number: 03/19/2024, 8:18 AM   Clinical Narrative:    Patient stable for discharge with mini express. Patient to follow up with Dr. Kerrin.  This RNCM offered choice for Home Health, STRAN RAPER states he has no preference, RNCM made referral to Amy with [facility], She is able to take referral. No other TOC needs at this time.  Final next level of care: Home w Home Health Services Barriers to Discharge: Barriers Resolved   Patient Goals and CMS Choice Patient states their goals for this hospitalization and ongoing recovery are:: return home CMS Medicare.gov Compare Post Acute Care list provided to:: Patient Choice offered to / list presented to : Patient      Discharge Placement                       Discharge Plan and Services Additional resources added to the After Visit Summary for                            [facility] Arranged: RN [facility] Agency: [facility] Date [facility] Agency Contacted: 03/19/24 Time HH Agency Contacted: [phone] Representative spoke with at [facility] Agency: Amy  Social Drivers of Health (SDOH) Interventions SDOH Screenings   Food Insecurity: No Food Insecurity (03/12/2024)  Housing: Low Risk  (03/12/2024)  Transportation Needs: No Transportation Needs (03/12/2024)  Utilities: Not At Risk (03/12/2024)  Alcohol Screen: Low Risk  (09/29/2023)  Depression (PHQ2-9): Low Risk  (10/03/2023)  Financial Resource Strain: Low Risk  (09/29/2023)  Physical Activity: Insufficiently Active (09/29/2023)  Social Connections: Moderately Integrated (03/12/2024)  Stress: No Stress Concern Present (09/29/2023)  Tobacco Use: Medium Risk (03/12/2024)     Readmission Risk Interventions    03/19/2024    8:18 AM  Readmission Risk Prevention Plan  Post Dischage Appt Complete  Medication  Screening Complete  Transportation Screening Complete

## 2024-03-19 NOTE — Discharge Instructions (Signed)
 Discharge Instructions:  1. You may shower, please wash incisions daily with soap and water and keep dry.  If you wish to cover wounds with dressing you may do so but please keep clean and change daily.  No tub baths or swimming until incisions have completely healed.  If your incisions become red or develop any drainage please call our office at [phone]  2. No Driving until cleared by [facility]rickson's office and you are no longer using narcotic pain medications  3. Monitor your weight daily.. Please use the same scale and weigh at same time... If you gain 5-10 lbs in 48 hours with associated lower extremity swelling, please contact our office at [phone]  4. Fever of 101.5 for at least 24 hours with no source, please contact our office at [phone]  5. Activity- up as tolerated, please walk at least 3 times per day.  Avoid strenuous activity, no lifting, pushing, or pulling with your arms over 8-10 lbs for a minimum of 6 weeks  6. If any questions or concerns arise, please do not hesitate to contact our office at [phone]  7.Empty the Mini Express chest tube cannister as needed with a syringe.

## 2024-03-19 NOTE — Progress Notes (Addendum)
 [address]       [place] [address]             [phone]      7 Days Post-Op Procedure(s) (LRB): LOBECTOMY, LUNG, ROBOT-ASSISTED, USING VATS (Left) THORACOTOMY, MAJOR (Left) RECONSTRUCTION, MAJOR, CHEST WALL (Left) BLOCK, NERVE, INTERCOSTAL (Left) LYMPH NODE BIOPSY (Left) Subjective: Open the bedside chair.  Feels good, no shortness of breath, pain controlled ,no new concerns.  Wants to go home today.  Objective: Vital signs in last 24 hours: Temp:  [97.6 F (36.4 C)-98.3 F (36.8 C)] 97.9 F (36.6 C) (07/14 0409) Pulse Rate:  [60-72] 72 (07/14 0409) Cardiac Rhythm: Normal sinus rhythm (07/13 2030) Resp:  [13-20] 20 (07/14 0409) BP: (128-168)/(68-74) 134/72 (07/14 0409) SpO2:  [94 %-97 %] 94 % (07/14 0409)     Intake/Output from previous day: 07/13 0701 - 07/14 0700 In: 600 [P.O.:600] Out: 42 [Urine:2; Chest Tube:40] Intake/Output this shift: No intake/output data recorded.  General appearance: alert, cooperative, and no distress Neurologic: intact Heart: no further atrial fibrillation. Maintaining SR ~58-70/min. Was in SB in the low 50's pre-op.  Lungs: clear, diminished on the left. Small air leak with cough. CT drainage for past 24 hours.  Wound: Port incisions are dry, the chest tube is secure.   Lab Results: Recent Labs    03/17/24 0549  WBC 10.7*  HGB 10.6*  HCT 31.9*  PLT 261   BMET:  Recent Labs    03/17/24 0549 03/18/24 0539  NA 137 135  K 4.2 4.3  CL 102 102  CO2 27 26  GLUCOSE 116* 103*  BUN 14 21  CREATININE 0.79 0.76  CALCIUM  8.5* 8.4*    PT/INR: No results for input(s): LABPROT, INR in the last 72 hours. ABG    Component Value Date/Time   PHART 7.366 03/12/2024 1637   HCO3 25.5 03/12/2024 1637   TCO2 27 03/12/2024 1637   ACIDBASEDEF 2.0 03/12/2024 1508   O2SAT 99 03/12/2024 1637   CBG (last 3)  No results for input(s): GLUCAP in the last 72 hours.  Assessment/Plan: S/P Procedure(s)  (LRB): LOBECTOMY, LUNG, ROBOT-ASSISTED, USING VATS (Left) THORACOTOMY, MAJOR (Left) RECONSTRUCTION, MAJOR, CHEST WALL (Left) BLOCK, NERVE, INTERCOSTAL (Left) LYMPH NODE BIOPSY (Left)  -Post-op day 7 robotic assisted left upper lobectomy for stage IIb squamous cell lung cancer with chest wall reconstruction.  Has had a small with persistent airleak.  Chest x-ray is stable with a left apical space and he is maintaining adequate oxygenation on room air.    - Postoperative atrial fibrillation: Converted back to sinus rhythm after IV amiodarone  loading.  He has been transitioned to oral amiodarone .  No further A-fib past 2 days.  Will continue oral loading and taper the dose in the outpatient setting  -Disposition: Discussed with Dr. Kerrin.  Placed the left pleural tube to an [facility] mini express yesterday and we have request home health RN to assist with management at home. Russell [name] has also been instructed in how to care and drain the device if needed.  Plan for discharge today and he will follow-up in the office on Thursday with a chest x-ray.   LOS: 7 days    Russell G. Roddenberry, PA-C 03/19/2024  Patient seen and examined, agree with above Space a little larger on mini-xpress, still with small air leak Clinically stable- will dc home with tube with follow up in office in a few days  Russell C. Kerrin, MD [facility] [phone]

## 2024-03-20 ENCOUNTER — Telehealth: Payer: Self-pay

## 2024-03-20 NOTE — Transitions of Care (Post Inpatient/ED Visit) (Addendum)
 03/20/2024  Name: Russell [name] MRN: [id number] DOB: [birth year]  Today's TOC FU Call Status: Today's TOC FU Call Status:: Successful TOC FU Call Completed TOC FU Call Complete Date: 03/20/24 Patient's Name and Date of Birth confirmed.  Transition Care Management Follow-up Telephone Call Date of Discharge: 03/19/24 Discharge Facility: [facility]) Type of Discharge: Inpatient Admission How have you been since you were released from the hospital?: Same Did you receive and understand the discharge instructions provided?: Yes Medications obtained,verified, and reconciled?: Partial Review Completed Reason for Partial Mediation Review: Reviewed new medications on AVS with patient and states, I have all my medicines Any new allergies since your discharge?: No Dietary orders reviewed?: NA Do you have support at home?: Yes People in Home [RPT]: spouse  Medications Reviewed Today: States, I have all my medications  Medications Reviewed Today     Reviewed by Eilleen Richerd GRADE, RN (Registered Nurse) on 03/20/24 at 1425  Med List Status: <None>   Medication Order Taking? Sig Documenting Provider Last Dose Status Informant  amiodarone  (PACERONE ) 200 MG tablet [id number] Yes Take 2 tablets (400 mg total) by mouth 2 (two) times daily. For 2 weeks then decrease the dose to 1 tablet (200mg ) once daily. Roddenberry, Myron G, PA-C  Active   amLODipine  (NORVASC ) 5 MG tablet [id number]  TAKE 1 TABLET EVERY DAY FOR BLOOD PRESSURE Stacks, Butler, MD  Active Self  ascorbic acid (VITAMIN C) 500 MG tablet [id number]  Take 500 mg by mouth daily. Takes every other day [provider]  Active   aspirin  EC 81 MG tablet [id number]  Take 81 mg by mouth every other day.  [provider]  Active Self  Cholecalciferol (VITAMIN D3) 125 MCG (5000 UT) CAPS [id number]  Take 5,000 Units by mouth every other day. [provider]  Active Self  cyanocobalamin  (VITAMIN B12) 1000 MCG tablet [id number]   Take 1,000 mcg by mouth every other day. [provider]  Active Self  gabapentin  (NEURONTIN ) 300 MG capsule [id number] Yes Take 1 capsule (300 mg total) by mouth 2 (two) times daily. Roddenberry, Myron G, PA-C  Active   olmesartan  (BENICAR ) 40 MG tablet [id number]  Take 1 tablet (40 mg total) by mouth daily. For blood pressure Zollie Butler, MD  Active Self  rosuvastatin  (CRESTOR ) 10 MG tablet [id number]  Take 1 tablet (10 mg total) by mouth every other day. Elmira Newman PARAS, MD  Active Self  traMADol  (ULTRAM ) 50 MG tablet [id number] Yes Take 1 tablet (50 mg total) by mouth every 6 (six) hours as needed for up to 7 days for severe pain (pain score 7-10) (mild pain). Roddenberry, Myron G, PA-C  Active             Home Care and Equipment/Supplies: Were Home Health Services Ordered?: Yes Name of Home Health Agency:: [phone] Has Agency set up a time to come to your home?: Yes Any new equipment or medical supplies ordered?: NA  *Patient states the only thing I am concerned about is that the home health gets approved to come out for the visit, other than that I am fine and don't need additional follow up.  Functional Questionnaire: Do you need assistance with meal preparation?: No Do you need assistance with eating?: No  Follow up appointments reviewed:     **Offered to assist with post hospital appointment with PCP.  Patient encouraged to have appointment with PCP he states he has an appointment in August and that's the one I will  go to. Patient has an appointment with Dr. Kerrin 04/03/24 PCP Follow-up appointment confirmed?: Yes Date of PCP follow-up appointment?: 04/06/24 (Explained to patient this is not a hospital follow up, he states this is the appointment he will go to.) Follow-up Provider: Butler Der, MD Specialist Hospital Follow-up appointment confirmed?: Yes Date of Specialist follow-up appointment?: 04/03/24 Follow-Up Specialty Provider:: Dr. Kerrin Do  you need transportation to your follow-up appointment?: No Do you understand care options if your condition(s) worsen?: Yes-patient verbalized understanding  SDOH Interventions Today    Flowsheet Row Most Recent Value  SDOH Interventions   Food Insecurity Interventions Intervention Not Indicated  Housing Interventions Intervention Not Indicated  Transportation Interventions Intervention Not Indicated  Utilities Interventions Intervention Not Indicated    Richerd Fish, RN, BSN, CCM [place]  [facility], [facility] RN Care Manager Direct Dial: [phone]

## 2024-03-21 ENCOUNTER — Ambulatory Visit: Admitting: Cardiology

## 2024-03-21 ENCOUNTER — Other Ambulatory Visit: Payer: Self-pay | Admitting: Thoracic Surgery (Cardiothoracic Vascular Surgery)

## 2024-03-21 DIAGNOSIS — J439 Emphysema, unspecified: Secondary | ICD-10-CM | POA: Diagnosis not present

## 2024-03-21 DIAGNOSIS — J939 Pneumothorax, unspecified: Secondary | ICD-10-CM | POA: Diagnosis not present

## 2024-03-21 DIAGNOSIS — C3412 Malignant neoplasm of upper lobe, left bronchus or lung: Secondary | ICD-10-CM | POA: Diagnosis not present

## 2024-03-21 DIAGNOSIS — R911 Solitary pulmonary nodule: Secondary | ICD-10-CM

## 2024-03-21 DIAGNOSIS — Z483 Aftercare following surgery for neoplasm: Secondary | ICD-10-CM | POA: Diagnosis not present

## 2024-03-21 DIAGNOSIS — Z4803 Encounter for change or removal of drains: Secondary | ICD-10-CM | POA: Diagnosis not present

## 2024-03-21 DIAGNOSIS — I10 Essential (primary) hypertension: Secondary | ICD-10-CM | POA: Diagnosis not present

## 2024-03-21 DIAGNOSIS — I4891 Unspecified atrial fibrillation: Secondary | ICD-10-CM | POA: Diagnosis not present

## 2024-03-21 DIAGNOSIS — I700 Atherosclerosis of aorta: Secondary | ICD-10-CM | POA: Diagnosis not present

## 2024-03-21 DIAGNOSIS — Z4801 Encounter for change or removal of surgical wound dressing: Secondary | ICD-10-CM | POA: Diagnosis not present

## 2024-03-21 LAB — BPAM RBC
Blood Product Expiration Date: 202508062359
Blood Product Expiration Date: 202508062359
ISSUE DATE / TIME: 202507071217
ISSUE DATE / TIME: 202507071217
Unit Type and Rh: 5100
Unit Type and Rh: 5100

## 2024-03-21 LAB — TYPE AND SCREEN
ABO/RH(D): O POS
Antibody Screen: NEGATIVE
Unit division: 0
Unit division: 0

## 2024-03-21 NOTE — Progress Notes (Unsigned)
  HPI: [name] is a 73 year old man with a history of tobacco abuse, COPD, hypertension, hyperlipidemia, coronary disease, hiatal hernia, reflux, and squamous cell carcinoma of the lung.  He returns for routine postoperative follow-up having undergone Xi robotic-assisted left upper lobectomy with en bloc resection of chest wall including third and fourth ribs and wedge of superior segment of left lower lobe. Lymph node dissection and intercostal nerve blocks levels 3 through 10 with Dr. Kerrin on 03/12/2024.   The patient's early postoperative recovery while in the hospital was notable for developing atrial fibrillation.  He was started on amiodarone  and converted back to sinus rhythm. His chest tube was unable to be removed due to small airleak.  He was transitioned from the standard Pleur-evac to an [facility] Mini [facility].  He went home with this device at discharge on 03/19/2024.  Since hospital discharge the patient reports ***.   Current Outpatient Medications  Medication Sig Dispense Refill   amiodarone  (PACERONE ) 200 MG tablet Take 2 tablets (400 mg total) by mouth 2 (two) times daily. For 2 weeks then decrease the dose to 1 tablet (200mg ) once daily. 60 tablet 1   amLODipine  (NORVASC ) 5 MG tablet TAKE 1 TABLET EVERY DAY FOR BLOOD PRESSURE 90 tablet 3   ascorbic acid (VITAMIN C) 500 MG tablet Take 500 mg by mouth daily. Takes every other day     aspirin  EC 81 MG tablet Take 81 mg by mouth every other day.      Cholecalciferol (VITAMIN D3) 125 MCG (5000 UT) CAPS Take 5,000 Units by mouth every other day.     cyanocobalamin  (VITAMIN B12) 1000 MCG tablet Take 1,000 mcg by mouth every other day.     gabapentin  (NEURONTIN ) 300 MG capsule Take 1 capsule (300 mg total) by mouth 2 (two) times daily. 60 capsule 1   olmesartan  (BENICAR ) 40 MG tablet Take 1 tablet (40 mg total) by mouth daily. For blood pressure 90 tablet 3   rosuvastatin  (CRESTOR ) 10 MG tablet Take 1 tablet (10 mg total) by  mouth every other day. 90 tablet 1   traMADol  (ULTRAM ) 50 MG tablet Take 1 tablet (50 mg total) by mouth every 6 (six) hours as needed for up to 7 days for severe pain (pain score 7-10) (mild pain). 28 tablet 0   No current facility-administered medications for this visit.    Physical Exam: ***  Diagnostic Tests: ***   Plan: ***   Manuelita CHRISTELLA Rough, PA-C [facility] [phone]

## 2024-03-22 ENCOUNTER — Ambulatory Visit: Payer: Self-pay

## 2024-03-22 ENCOUNTER — Other Ambulatory Visit: Payer: Self-pay

## 2024-03-22 ENCOUNTER — Ambulatory Visit (HOSPITAL_COMMUNITY)
Admission: RE | Admit: 2024-03-22 | Discharge: 2024-03-22 | Disposition: A | Discharge status: Home / Self Care | Source: Ambulatory Visit | Attending: Cardiology | Admitting: Cardiology

## 2024-03-22 VITALS — BP 150/72 | HR 56 | Resp 20 | Ht 68.0 in | Wt 142.0 lb

## 2024-03-22 DIAGNOSIS — J439 Emphysema, unspecified: Secondary | ICD-10-CM | POA: Diagnosis not present

## 2024-03-22 DIAGNOSIS — R911 Solitary pulmonary nodule: Secondary | ICD-10-CM | POA: Diagnosis not present

## 2024-03-22 DIAGNOSIS — Z09 Encounter for follow-up examination after completed treatment for conditions other than malignant neoplasm: Secondary | ICD-10-CM

## 2024-03-22 DIAGNOSIS — R918 Other nonspecific abnormal finding of lung field: Secondary | ICD-10-CM | POA: Diagnosis not present

## 2024-03-22 DIAGNOSIS — Z48813 Encounter for surgical aftercare following surgery on the respiratory system: Secondary | ICD-10-CM | POA: Diagnosis not present

## 2024-03-22 NOTE — Progress Notes (Signed)
 The proposed treatment discussed in conference is for discussion purpose only and is not a binding recommendation.  The patients have not been physically examined, or presented with their treatment options.  Therefore, final treatment plans cannot be decided.

## 2024-03-22 NOTE — Progress Notes (Signed)
 error

## 2024-03-22 NOTE — Patient Instructions (Addendum)
-  Continue to drain chest tube as needed

## 2024-03-23 ENCOUNTER — Other Ambulatory Visit: Payer: Self-pay | Admitting: Thoracic Surgery (Cardiothoracic Vascular Surgery)

## 2024-03-23 DIAGNOSIS — R911 Solitary pulmonary nodule: Secondary | ICD-10-CM

## 2024-03-26 ENCOUNTER — Ambulatory Visit (HOSPITAL_COMMUNITY)
Admission: RE | Admit: 2024-03-26 | Discharge: 2024-03-26 | Disposition: A | Discharge status: Home / Self Care | Source: Ambulatory Visit | Attending: Cardiology | Admitting: Cardiology

## 2024-03-26 ENCOUNTER — Ambulatory Visit
Payer: Self-pay | Attending: Thoracic Surgery (Cardiothoracic Vascular Surgery) | Admitting: Thoracic Surgery (Cardiothoracic Vascular Surgery)

## 2024-03-26 VITALS — BP 160/70 | HR 50 | Resp 20 | Ht 68.0 in | Wt 143.0 lb

## 2024-03-26 DIAGNOSIS — J95811 Postprocedural pneumothorax: Secondary | ICD-10-CM | POA: Diagnosis not present

## 2024-03-26 DIAGNOSIS — Z09 Encounter for follow-up examination after completed treatment for conditions other than malignant neoplasm: Secondary | ICD-10-CM

## 2024-03-26 DIAGNOSIS — Z48813 Encounter for surgical aftercare following surgery on the respiratory system: Secondary | ICD-10-CM | POA: Diagnosis not present

## 2024-03-26 DIAGNOSIS — T797XXA Traumatic subcutaneous emphysema, initial encounter: Secondary | ICD-10-CM | POA: Diagnosis not present

## 2024-03-26 DIAGNOSIS — Z902 Acquired absence of lung [part of]: Secondary | ICD-10-CM | POA: Diagnosis not present

## 2024-03-26 DIAGNOSIS — R918 Other nonspecific abnormal finding of lung field: Secondary | ICD-10-CM | POA: Insufficient documentation

## 2024-03-26 DIAGNOSIS — R911 Solitary pulmonary nodule: Secondary | ICD-10-CM | POA: Diagnosis present

## 2024-03-26 NOTE — Progress Notes (Signed)
 [address], [place] [address]             [phone]     HPI: Russell [name] returns for follow-up after lobectomy with a prolonged air leak.  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  He underwent robotic assisted left upper lobectomy with en bloc resection of chest wall including 2nd and 3rd ribs on 03/12/2024.  Postoperative course was complicated by an air leak and he went home with a chest tube.  Also had atrial fibrillation treated with amiodarone .  He now returns for a scheduled follow-up visit.  He has some discomfort but is not taking any narcotics.  Has not had any respiratory issues.  Past Medical History:  Diagnosis Date   Dyspnea    with exertion   GERD (gastroesophageal reflux disease)    History of hiatal hernia 07/22/2006   small - dx by endoscopy   Hx of adenomatous polyp of colon 03/24/2016   Hyperlipidemia    Hypertension    Lung nodule 01/04/2024   left upper lobe   Pneumonia    x 1 as teenager    Current Outpatient Medications  Medication Sig Dispense Refill   amiodarone  (PACERONE ) 200 MG tablet Take 2 tablets (400 mg total) by mouth 2 (two) times daily. For 2 weeks then decrease the dose to 1 tablet (200mg ) once daily. 60 tablet 1   amLODipine  (NORVASC ) 5 MG tablet TAKE 1 TABLET EVERY DAY FOR BLOOD PRESSURE 90 tablet 3   ascorbic acid (VITAMIN C) 500 MG tablet Take 500 mg by mouth daily. Takes every other day     aspirin  EC 81 MG tablet Take 81 mg by mouth every other day.      Cholecalciferol (VITAMIN D3) 125 MCG (5000 UT) CAPS Take 5,000 Units by mouth every other day.     cyanocobalamin  (VITAMIN B12) 1000 MCG tablet Take 1,000 mcg by mouth every other day.     gabapentin  (NEURONTIN ) 300 MG capsule Take 1 capsule (300 mg total) by mouth 2 (two) times daily.  60 capsule 1   olmesartan  (BENICAR ) 40 MG tablet Take 1 tablet (40 mg total) by mouth daily. For blood pressure 90 tablet 3   rosuvastatin  (CRESTOR ) 10 MG tablet Take 1 tablet (10 mg total) by mouth every other day. 90 tablet 1   traMADol  (ULTRAM ) 50 MG tablet Take 1 tablet (50 mg total) by mouth every 6 (six) hours as needed for up to 7 days for severe pain (pain score 7-10) (mild pain). 28 tablet 0   No current facility-administered medications for this visit.    Physical Exam BP (!) 160/70   Pulse (!) 50   Resp 20   Ht 5' 8 (1.727 m)   Wt 143 lb (64.9 kg)   SpO2 97%   BMI 21.24 kg/m  Russell [name] in no acute distress Alert and oriented x 3 with no focal deficits Lungs diminished on the left otherwise clear Chest tube with serous drainage, no airleak with repeated coughing Incisions clean, dry and intact.  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  Stage IIb squamous cell carcinoma-T3, N0.  Status post left upper lobectomy with en bloc chest wall resection.  Margins clear.  Will need adjuvant therapy.  Will refer to Dr. Sherrod.  Status post left upper lobectomy-had an air leak and went home with a chest tube.  Today he has no sign airleak with repeated coughing.  His chest x-ray is stable compared to his film from last week.  Will discontinue chest tube.  Procedure-chest tube removed intact without difficulty, patient tolerated well.  He will be observed in the office for 20 minutes to go home.  Postoperative atrial fibrillation-on the amiodarone .  Will plan to continue for about 8 weeks.  Plan: DC chest tube He knows to call us  and 911 immediately if he experiences any  respiratory distress or develops subcutaneous emphysema. Return in 2 weeks with PA lateral chest x-ray  Elspeth JAYSON Millers, MD [facility] [phone]

## 2024-03-29 DIAGNOSIS — C3412 Malignant neoplasm of upper lobe, left bronchus or lung: Secondary | ICD-10-CM | POA: Diagnosis not present

## 2024-03-29 DIAGNOSIS — I4891 Unspecified atrial fibrillation: Secondary | ICD-10-CM | POA: Diagnosis not present

## 2024-03-29 DIAGNOSIS — I700 Atherosclerosis of aorta: Secondary | ICD-10-CM | POA: Diagnosis not present

## 2024-03-29 DIAGNOSIS — Z483 Aftercare following surgery for neoplasm: Secondary | ICD-10-CM | POA: Diagnosis not present

## 2024-03-29 DIAGNOSIS — J439 Emphysema, unspecified: Secondary | ICD-10-CM | POA: Diagnosis not present

## 2024-03-29 DIAGNOSIS — J939 Pneumothorax, unspecified: Secondary | ICD-10-CM | POA: Diagnosis not present

## 2024-03-29 DIAGNOSIS — Z4803 Encounter for change or removal of drains: Secondary | ICD-10-CM | POA: Diagnosis not present

## 2024-03-29 DIAGNOSIS — Z4801 Encounter for change or removal of surgical wound dressing: Secondary | ICD-10-CM | POA: Diagnosis not present

## 2024-03-29 DIAGNOSIS — I10 Essential (primary) hypertension: Secondary | ICD-10-CM | POA: Diagnosis not present

## 2024-04-03 ENCOUNTER — Ambulatory Visit: Admitting: Thoracic Surgery (Cardiothoracic Vascular Surgery)

## 2024-04-06 ENCOUNTER — Other Ambulatory Visit: Payer: Self-pay | Admitting: Thoracic Surgery (Cardiothoracic Vascular Surgery)

## 2024-04-06 DIAGNOSIS — R911 Solitary pulmonary nodule: Secondary | ICD-10-CM

## 2024-04-06 LAB — MOLECULAR PATHOLOGY

## 2024-04-10 ENCOUNTER — Ambulatory Visit (HOSPITAL_COMMUNITY)
Admission: RE | Admit: 2024-04-10 | Discharge: 2024-04-10 | Disposition: A | Discharge status: Home / Self Care | Source: Ambulatory Visit | Attending: Thoracic Surgery (Cardiothoracic Vascular Surgery) | Admitting: Thoracic Surgery (Cardiothoracic Vascular Surgery)

## 2024-04-10 ENCOUNTER — Ambulatory Visit
Payer: Self-pay | Attending: Thoracic Surgery (Cardiothoracic Vascular Surgery) | Admitting: Thoracic Surgery (Cardiothoracic Vascular Surgery)

## 2024-04-10 ENCOUNTER — Encounter: Payer: Self-pay | Admitting: Thoracic Surgery (Cardiothoracic Vascular Surgery)

## 2024-04-10 VITALS — BP 156/76 | HR 52 | Resp 18 | Ht 68.0 in | Wt 144.0 lb

## 2024-04-10 DIAGNOSIS — Z09 Encounter for follow-up examination after completed treatment for conditions other than malignant neoplasm: Secondary | ICD-10-CM

## 2024-04-10 DIAGNOSIS — R9389 Abnormal findings on diagnostic imaging of other specified body structures: Secondary | ICD-10-CM | POA: Diagnosis not present

## 2024-04-10 DIAGNOSIS — R911 Solitary pulmonary nodule: Secondary | ICD-10-CM | POA: Insufficient documentation

## 2024-04-10 DIAGNOSIS — J948 Other specified pleural conditions: Secondary | ICD-10-CM | POA: Insufficient documentation

## 2024-04-10 DIAGNOSIS — Z4682 Encounter for fitting and adjustment of non-vascular catheter: Secondary | ICD-10-CM | POA: Diagnosis not present

## 2024-04-10 NOTE — Progress Notes (Signed)
 [address] [place] [address]             [phone]     HPI: [name] returns for a scheduled follow-up visit  Russell [name] is a 73 year old with a history of tobacco abuse who underwent robotic assisted left upper lobectomy with en bloc chest wall resection and reconstruction on 03/12/2024.  Postoperatively had an air leak and went home with a chest tube.  I removed that chest tube in the office last week.  He tolerated that well.  He feels well.  Does have some incisional pain, but is not having to take any narcotics.  No respiratory issues.  Past Medical History:  Diagnosis Date   Dyspnea    with exertion   GERD (gastroesophageal reflux disease)    History of hiatal hernia 07/22/2006   small - dx by endoscopy   Hx of adenomatous polyp of colon 03/24/2016   Hyperlipidemia    Hypertension    Lung nodule 01/04/2024   left upper lobe   Pneumonia    x 1 as teenager    Current Outpatient Medications  Medication Sig Dispense Refill   amiodarone  (PACERONE ) 200 MG tablet Take 2 tablets (400 mg total) by mouth 2 (two) times daily. For 2 weeks then decrease the dose to 1 tablet (200mg ) once daily. 60 tablet 1   amLODipine  (NORVASC ) 5 MG tablet TAKE 1 TABLET EVERY DAY FOR BLOOD PRESSURE 90 tablet 3   ascorbic acid (VITAMIN C) 500 MG tablet Take 500 mg by mouth daily. Takes every other day     aspirin  EC 81 MG tablet Take 81 mg by mouth every other day.      Cholecalciferol (VITAMIN D3) 125 MCG (5000 UT) CAPS Take 5,000 Units by mouth every other day.     cyanocobalamin  (VITAMIN B12) 1000 MCG tablet Take 1,000 mcg by mouth every other day.     olmesartan  (BENICAR ) 40 MG tablet Take 1 tablet (40 mg total) by mouth daily. For blood pressure 90 tablet 3   rosuvastatin  (CRESTOR ) 10 MG tablet Take 1 tablet (10 mg total) by mouth every other day. 90 tablet 1   No current facility-administered medications for this visit.    Physical Exam BP (!) 156/76 (BP  Location: Left Arm)   Pulse (!) 52   Resp 18   Ht 5' 8 (1.727 m)   Wt 144 lb (65.3 kg)   SpO2 97% Comment: RA  BMI 21.6 kg/m  73 year old man in no acute distress Alert and oriented x 3 with no focal deficits Lungs diminished on the left, otherwise clear Incisions healing well  Diagnostic Tests: I personally reviewed his chest x-ray images.  Postoperative changes present.  There is an apical space partially fluid-filled.  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  He underwent robotic assisted left upper lobectomy and en bloc chest wall resection on 03/12/2024.  Postoperative course complicated by an air leak.  Chest tube was removed in the office a week ago.  Also had atrial fibrillation and was treated with amiodarone .  Stage IIb squamous cell carcinoma-has an appointment with Dr. Sherrod on Thursday of this week to discuss adjuvant therapy.  Status post left upper lobectomy with en bloc chest wall resection-he is doing extremely well from a pain standpoint.  Advised  him not to do any lifting or engage in heavy upper body activity until after Labor Day.  After that he can gradually resume activities as tolerated.  He is driving.  Tobacco use-quit smoking in May 2025  Plan: Consultation with Dr. Sherrod as scheduled I will plan to see him back in about 6 weeks to check on his progress  Elspeth JAYSON Millers, MD [facility] [phone]

## 2024-04-11 ENCOUNTER — Encounter: Payer: Self-pay | Admitting: Family Medicine

## 2024-04-11 ENCOUNTER — Ambulatory Visit: Payer: Medicare HMO | Admitting: Family Medicine

## 2024-04-11 VITALS — BP 142/68 | HR 50 | Temp 97.8°F | Ht 68.0 in | Wt 144.6 lb

## 2024-04-11 DIAGNOSIS — Z13220 Encounter for screening for lipoid disorders: Secondary | ICD-10-CM

## 2024-04-11 DIAGNOSIS — Z125 Encounter for screening for malignant neoplasm of prostate: Secondary | ICD-10-CM

## 2024-04-11 DIAGNOSIS — Z902 Acquired absence of lung [part of]: Secondary | ICD-10-CM | POA: Diagnosis not present

## 2024-04-11 DIAGNOSIS — I700 Atherosclerosis of aorta: Secondary | ICD-10-CM

## 2024-04-11 DIAGNOSIS — D485 Neoplasm of uncertain behavior of skin: Secondary | ICD-10-CM | POA: Diagnosis not present

## 2024-04-11 DIAGNOSIS — I10 Essential (primary) hypertension: Secondary | ICD-10-CM | POA: Diagnosis not present

## 2024-04-11 DIAGNOSIS — Z0001 Encounter for general adult medical examination with abnormal findings: Secondary | ICD-10-CM

## 2024-04-11 DIAGNOSIS — Z0000 Encounter for general adult medical examination without abnormal findings: Secondary | ICD-10-CM | POA: Diagnosis not present

## 2024-04-11 DIAGNOSIS — E559 Vitamin D deficiency, unspecified: Secondary | ICD-10-CM | POA: Diagnosis not present

## 2024-04-11 LAB — URINALYSIS
Bilirubin, UA: NEGATIVE
Glucose, UA: NEGATIVE
Ketones, UA: NEGATIVE
Leukocytes,UA: NEGATIVE
Nitrite, UA: NEGATIVE
Protein,UA: NEGATIVE
RBC, UA: NEGATIVE
Specific Gravity, UA: 1.015 (ref 1.005–1.030)
Urobilinogen, Ur: 0.2 mg/dL (ref 0.2–1.0)
pH, UA: 6.5 (ref 5.0–7.5)

## 2024-04-11 NOTE — Progress Notes (Signed)
 Subjective:  Patient ID: Russell [name], male    DOB: [birth year]  Age: 73 y.o. MRN: [id number]  CC: Annual Exam   HPI [name] presents for Annual physical. Just had left upper lobe resection for lung cancer 1 month ago tomorrow.  He is seeing hematology/oncology tomorrow to discuss further treatment.  He was surprised to find that he might have to do radiation and/or chemo in addition to the surgery.  He had been under the impression that the surgery would complete the treatment.  Concerned abouot lump at left upper anterior thigh. Present for 2 weeeks Systolic BP at home 130s to 150.  He went off of the olmesartan  after surgery and blood pressure seems to be doing well without it.  He is continuing the amlodipine .  On the CT scan that was performed to define his lung cancer he was noted to have aortic atherosclerosis as well.  He ended up seeing a cardiologist for surgical clearance but would like to establish with Dr. Lavona and have someone explained to him further the significance of the aortic atherosclerosis and the treatment for that.    04/11/2024    7:45 AM 10/03/2023    2:20 PM 03/31/2023    9:03 AM  Depression screen PHQ 2/9  Decreased Interest 0 0 1  Down, Depressed, Hopeless 0 0 1  PHQ - 2 Score 0 0 2  Altered sleeping 0 0 1  Tired, decreased energy 1 1 1   Change in appetite 0 0 1  Feeling bad or failure about yourself  0 0 0  Trouble concentrating 1 0 1  Moving slowly or fidgety/restless 0 0 0  Suicidal thoughts 0 0 0  PHQ-9 Score 2 1 6   Difficult doing work/chores Not difficult at all Not difficult at all Somewhat difficult    History Firmin has a past medical history of Dyspnea, GERD (gastroesophageal reflux disease), History of hiatal hernia (07/22/2006), adenomatous polyp of colon (03/24/2016), Hyperlipidemia, Hypertension, Lung nodule (01/04/2024), and Pneumonia.   He has a past surgical history that includes Hernia repair; Fracture surgery (Left, age 40);  Wisdom tooth extraction; Colonoscopy (09/07/2003); Upper gi endoscopy; Upper gastrointestinal endoscopy; cyst removal from gum; Bronchial needle aspiration biopsy (10/17/2023); Bronchial biopsy (10/17/2023); Bronchial brushings (10/17/2023); Bronchial washings (10/17/2023); Video bronchoscopy with endobronchial navigation (Left, 01/17/2024); Bronchial washings (01/17/2024); Bronchial needle aspiration biopsy (01/17/2024); Bronchial brushings (01/17/2024); Bronchial biopsy (01/17/2024); Fuducial placement (01/17/2024); Lobectomy, lung, robot-assisted, using vats (Left, 03/12/2024); Thoracotomy (Left, 03/12/2024); Chest wall reconstruction (Left, 03/12/2024); Intercostal nerve block (Left, 03/12/2024); and Lymph node biopsy (Left, 03/12/2024).   His family history includes Diabetes in his mother; Heart disease in his mother; Hyperlipidemia in his father.He reports that he quit smoking about 2 months ago. His smoking use included cigarettes. He has a 45 pack-year smoking history. He has never used smokeless tobacco. He reports current alcohol use of about 14.0 standard drinks of alcohol per week. He reports that he does not use drugs.    ROS Review of Systems  Constitutional:  Negative for activity change, fatigue and unexpected weight change.  HENT:  Negative for congestion, ear pain, hearing loss, postnasal drip and trouble swallowing.   Eyes:  Negative for pain and visual disturbance.  Respiratory:  Negative for cough, chest tightness and shortness of breath.   Cardiovascular:  Negative for chest pain, palpitations and leg swelling.  Gastrointestinal:  Negative for abdominal distention, abdominal pain, blood in stool, constipation, diarrhea, nausea and vomiting.  Endocrine: Negative for cold  intolerance, heat intolerance and polydipsia.  Genitourinary:  Negative for difficulty urinating, dysuria, flank pain, frequency and urgency.  Musculoskeletal:  Negative for arthralgias and joint swelling.  Skin:  Negative for  color change, rash and wound.  Neurological:  Negative for dizziness, syncope, speech difficulty, weakness, light-headedness, numbness and headaches.  Hematological:  Does not bruise/bleed easily.  Psychiatric/Behavioral:  Negative for confusion, decreased concentration, dysphoric mood and sleep disturbance. The patient is not nervous/anxious.     Objective:  BP (!) 142/68   Pulse (!) 50   Temp 97.8 F (36.6 C)   Ht 5' 8 (1.727 m)   Wt 144 lb 9.6 oz (65.6 kg)   SpO2 98%   BMI 21.99 kg/m   BP Readings from Last 3 Encounters:  04/11/24 (!) 142/68  04/10/24 (!) 156/76  03/26/24 (!) 160/70    Wt Readings from Last 3 Encounters:  04/11/24 144 lb 9.6 oz (65.6 kg)  04/10/24 144 lb (65.3 kg)  03/26/24 143 lb (64.9 kg)     Physical Exam Constitutional:      Appearance: He is well-developed.  HENT:     Head: Normocephalic and atraumatic.  Eyes:     Pupils: Pupils are equal, round, and reactive to light.  Neck:     Thyroid : No thyromegaly.     Trachea: No tracheal deviation.  Cardiovascular:     Rate and Rhythm: Normal rate and regular rhythm.     Heart sounds: Normal heart sounds. No murmur heard.    No friction rub. No gallop.  Pulmonary:     Breath sounds: Normal breath sounds. No wheezing or rales.     Comments: There is fresh scarring at the left axilla following the line of the ribs with adjacent puncture wounds noted as well.  These are related to his recent left upper lobectomy. Abdominal:     General: Bowel sounds are normal. There is no distension.     Palpations: Abdomen is soft. There is no mass.     Tenderness: There is no abdominal tenderness.     Hernia: There is no hernia in the left inguinal area.  Genitourinary:    Penis: Normal.      Testes: Normal.  Musculoskeletal:        General: Normal range of motion.     Cervical back: Normal range of motion.  Lymphadenopathy:     Cervical: No cervical adenopathy.  Skin:    General: Skin is warm and dry.   Neurological:     Mental Status: He is alert and oriented to person, place, and time.      Assessment & Plan:  Primary hypertension -     CBC with Differential/Platelet -     CMP14+EGFR -     Lipid panel -     Urinalysis  Physical exam, annual -     CBC with Differential/Platelet -     CMP14+EGFR -     Lipid panel -     PSA, total and free -     Urinalysis -     VITAMIN D  25 Hydroxy (Vit-D Deficiency, Fractures)  Lipid screening  Vitamin D  deficiency -     VITAMIN D  25 Hydroxy (Vit-D Deficiency, Fractures)  Prostate cancer screening -     PSA, total and free  Arterial atherosclerosis -     Ambulatory referral to Cardiology     Follow-up: No follow-ups on file.  Butler Der, M.D.

## 2024-04-12 ENCOUNTER — Inpatient Hospital Stay

## 2024-04-12 ENCOUNTER — Ambulatory Visit: Payer: Self-pay | Admitting: Family Medicine

## 2024-04-12 ENCOUNTER — Inpatient Hospital Stay: Attending: Internal Medicine | Admitting: Internal Medicine

## 2024-04-12 VITALS — BP 138/60 | HR 54 | Temp 98.2°F | Resp 17 | Wt 144.5 lb

## 2024-04-12 DIAGNOSIS — I10 Essential (primary) hypertension: Secondary | ICD-10-CM | POA: Insufficient documentation

## 2024-04-12 DIAGNOSIS — Z5189 Encounter for other specified aftercare: Secondary | ICD-10-CM | POA: Insufficient documentation

## 2024-04-12 DIAGNOSIS — C3412 Malignant neoplasm of upper lobe, left bronchus or lung: Secondary | ICD-10-CM | POA: Insufficient documentation

## 2024-04-12 DIAGNOSIS — D649 Anemia, unspecified: Secondary | ICD-10-CM | POA: Insufficient documentation

## 2024-04-12 DIAGNOSIS — Z5111 Encounter for antineoplastic chemotherapy: Secondary | ICD-10-CM | POA: Insufficient documentation

## 2024-04-12 LAB — CBC WITH DIFFERENTIAL/PLATELET
Basophils Absolute: 0.1 x10E3/uL (ref 0.0–0.2)
Basos: 1 %
EOS (ABSOLUTE): 0.5 x10E3/uL — ABNORMAL HIGH (ref 0.0–0.4)
Eos: 5 %
Hematocrit: 39.2 % (ref 37.5–51.0)
Hemoglobin: 12.8 g/dL — ABNORMAL LOW (ref 13.0–17.7)
Immature Grans (Abs): 0 x10E3/uL (ref 0.0–0.1)
Immature Granulocytes: 0 %
Lymphocytes Absolute: 1.3 x10E3/uL (ref 0.7–3.1)
Lymphs: 13 %
MCH: 30.9 pg (ref 26.6–33.0)
MCHC: 32.7 g/dL (ref 31.5–35.7)
MCV: 95 fL (ref 79–97)
Monocytes Absolute: 0.9 x10E3/uL (ref 0.1–0.9)
Monocytes: 9 %
Neutrophils Absolute: 6.8 x10E3/uL (ref 1.4–7.0)
Neutrophils: 72 %
Platelets: 289 x10E3/uL (ref 150–450)
RBC: 4.14 x10E6/uL (ref 4.14–5.80)
RDW: 13 % (ref 11.6–15.4)
WBC: 9.6 x10E3/uL (ref 3.4–10.8)

## 2024-04-12 LAB — PSA, TOTAL AND FREE
PSA, Free Pct: 28
PSA, Free: 0.14 ng/mL
Prostate Specific Ag, Serum: 0.5 ng/mL (ref 0.0–4.0)

## 2024-04-12 LAB — CMP14+EGFR
ALT: 14 IU/L (ref 0–44)
AST: 19 IU/L (ref 0–40)
Albumin: 4.2 g/dL (ref 3.8–4.8)
Alkaline Phosphatase: 101 IU/L (ref 44–121)
BUN/Creatinine Ratio: 17 (ref 10–24)
BUN: 16 mg/dL (ref 8–27)
Bilirubin Total: 0.4 mg/dL (ref 0.0–1.2)
CO2: 21 mmol/L (ref 20–29)
Calcium: 9 mg/dL (ref 8.6–10.2)
Chloride: 101 mmol/L (ref 96–106)
Creatinine, Ser: 0.95 mg/dL (ref 0.76–1.27)
Globulin, Total: 2.6 g/dL (ref 1.5–4.5)
Glucose: 91 mg/dL (ref 70–99)
Potassium: 4.8 mmol/L (ref 3.5–5.2)
Sodium: 139 mmol/L (ref 134–144)
Total Protein: 6.8 g/dL (ref 6.0–8.5)
eGFR: 85 mL/min/1.73 (ref >59)

## 2024-04-12 LAB — LIPID PANEL
Chol/HDL Ratio: 1.7 ratio (ref 0.0–5.0)
Cholesterol, Total: 161 mg/dL (ref 100–199)
HDL: 96 mg/dL (ref >39)
LDL Chol Calc (NIH): 56 mg/dL (ref 0–99)
Triglycerides: 35 mg/dL (ref 0–149)
VLDL Cholesterol Cal: 9 mg/dL (ref 5–40)

## 2024-04-12 LAB — VITAMIN D 25 HYDROXY (VIT D DEFICIENCY, FRACTURES): Vit D, 25-Hydroxy: 46.2 ng/mL (ref 30.0–100.0)

## 2024-04-12 MED ORDER — ONDANSETRON HCL 8 MG PO TABS: 8.0000 mg | ORAL_TABLET | Freq: Three times a day (TID) | ORAL | 1 refills | Status: DC | PRN

## 2024-04-12 MED ORDER — PROCHLORPERAZINE MALEATE 10 MG PO TABS: 10.0000 mg | ORAL_TABLET | Freq: Four times a day (QID) | ORAL | 1 refills | Status: DC | PRN

## 2024-04-12 MED ORDER — DEXAMETHASONE 4 MG PO TABS: ORAL_TABLET | ORAL | 1 refills | Status: DC

## 2024-04-12 NOTE — Progress Notes (Signed)
 [facility] [phone]   [phone]  CONSULT NOTE  REFERRING PHYSICIAN: Dr. Elspeth Millers  REASON FOR CONSULTATION:  73 years old white male recently diagnosed with lung cancer  HPI Russell [name] is a 73 y.o. male with past medical history significant for hypertension, dyslipidemia, GERD, hiatal hernia, colon polyps and recently diagnosed lung cancer.  The patient was enrolled on the CT chest lung cancer screening program and on December 08, 2022 CT screening of the lung was performed and it showed enlarging mixed solid and subsolid lesion in the left upper lobe highly concerning for bronchogenic adenocarcinoma and it measured 2.6 cm.  CT's over the of the chest on 05/04/2023 showed the posterior left upper lobe primarily cavitary lesion again identified and it measured 3.4 x 2.8 cm suspicious for lung cancer.  A PET scan on May 19, 2023 showed the cavitary lesion in the left upper lobe with a rim like area of hypermetabolism along the lateral/peripheral margins that indeterminate finding and could be due to chronic inflammation but neoplasm could not be completely excluded.  There was also new lingular infiltrate and no enlarged or hypermetabolic mediastinal or hilar lymph node and no evidence of metastatic disease involving the abdomen/pelvis or bony structures.  Repeat PET scan on September 09, 2023 showed the 3.4 cm cavitary mass in the left upper lobe has a send medial wall but is thick-walled lateral wall with maximum SUV specially along the lateral wall at 14.4 and cavitary malignancy was a consideration.  There was no evidence of metastatic disease.  On October 17, 2023 the patient had a video bronchoscopy with robotic assisted bronchoscopic navigation under the care of Dr. Shelah.  The final cytology showed atypical cells.  He was followed by observation and repeat CT scan of the chest without contrast on 12/26/2023 showed interval increase in the size  of the left upper lobe peripheral cavitary mass measuring 4.7 x 3.2 cm.  Repeat bronchoscopy on 01/17/2004 by Dr. Shelah was positive for squamous cell carcinoma.  The patient was then referred to Dr. Millers and on March 12, 2024, he underwent robotic assisted left upper lobectomy with en bloc resection of the chest wall including 4th and 5th ribs as well as wedge of the superior segment of the left lower lobe with lymph node dissection.  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  The dissected lymph nodes were negative for malignancy.  PD-L1 expression was 10%. The patient was referred to me today for evaluation and recommendation regarding treatment of his condition.  HPI  Discussed the use of AI scribe software for clinical note transcription with the patient, who gave verbal consent to proceed.  History of Present Illness Russell [name] is a 73 year old male with lung cancer who presents for follow-up after recent surgery. He is accompanied by his wife, [name], and his daughter, [name]. He was referred by Dr. Shelah for further management of lung cancer.  In April 2024, a routine CT screening of the chest due to his history of smoking revealed a 2.6 cm nodule in the left upper lobe. Follow-up imaging in August 2024 showed an increase in size to 3.4 by 2.8 cm. A PET scan in September 2024 indicated the mass was active but not highly so. A subsequent PET  scan in January 2025 showed increased hypermetabolic activity and thickening of the mass wall.  In February 2025, a bronchoscopy revealed atypical cells but did not confirm cancer. He underwent a left upper lobectomy and wedge resection of the superior segment of the left lower lobe, with removal of the fourth and fifth ribs. The tumor  was confirmed as squamous cell carcinoma, measuring 6.2 cm.  He reports soreness and tenderness, especially due to the removal of part of the chest wall. No shortness of breath, hemoptysis, nausea, vomiting, diarrhea, headaches, or significant weight loss.  His past medical history includes high blood pressure, high cholesterol, hiatal hernia, and colon polyps. He recalls having pneumonia as a child. He has no history of diabetes, heart attacks, or strokes.  Family history reveals his mother had heart disease and diabetes, and his father had high cholesterol. There is no family history of cancer.  Social history includes a 40+ year history of smoking, which he quit three months ago. He drinks alcohol in the evenings and denies any drug abuse. He worked in [employer] and currently owns a used [employer]. He is not aware of any medication allergies.     Past Medical History:  Diagnosis Date   Dyspnea    with exertion   GERD (gastroesophageal reflux disease)    History of hiatal hernia 07/22/2006   small - dx by endoscopy   Hx of adenomatous polyp of colon 03/24/2016   Hyperlipidemia    Hypertension    Lung nodule 01/04/2024   left upper lobe   Pneumonia    x 1 as teenager   Pneumothorax after biopsy 10/17/2023   Pulmonary nodule 04/27/2023   Status post lobectomy of lung 03/12/2024      Past Surgical History:  Procedure Laterality Date   BRONCHIAL BIOPSY  10/17/2023   Procedure: BRONCHIAL BIOPSIES;  Surgeon: Shelah Lamar RAMAN, MD;  Location: [facility] ENDOSCOPY;  Service: Pulmonary;;   BRONCHIAL BIOPSY  01/17/2024   Procedure: BRONCHOSCOPY, WITH BIOPSY;  Surgeon: Shelah Lamar RAMAN, MD;  Location: [facility] ENDOSCOPY;  Service: Pulmonary;;   BRONCHIAL BRUSHINGS  10/17/2023   Procedure: BRONCHIAL BRUSHINGS;  Surgeon: Shelah Lamar RAMAN, MD;  Location: [facility] ENDOSCOPY;  Service: Pulmonary;;   BRONCHIAL BRUSHINGS  01/17/2024   Procedure: BRONCHOSCOPY, WITH BRUSH BIOPSY;  Surgeon: Shelah Lamar RAMAN, MD;   Location: [facility] ENDOSCOPY;  Service: Pulmonary;;   BRONCHIAL NEEDLE ASPIRATION BIOPSY  10/17/2023   Procedure: BRONCHIAL NEEDLE ASPIRATION BIOPSIES;  Surgeon: Shelah Lamar RAMAN, MD;  Location: [facility] ENDOSCOPY;  Service: Pulmonary;;   BRONCHIAL NEEDLE ASPIRATION BIOPSY  01/17/2024   Procedure: BRONCHOSCOPY, WITH NEEDLE ASPIRATION BIOPSY;  Surgeon: Shelah Lamar RAMAN, MD;  Location: [facility] ENDOSCOPY;  Service: Pulmonary;;   BRONCHIAL WASHINGS  10/17/2023   Procedure: BRONCHIAL WASHINGS;  Surgeon: Shelah Lamar RAMAN, MD;  Location: [facility] ENDOSCOPY;  Service: Pulmonary;;   BRONCHIAL WASHINGS  01/17/2024   Procedure: IRRIGATION, BRONCHUS;  Surgeon: Shelah Lamar RAMAN, MD;  Location: [facility] ENDOSCOPY;  Service: Pulmonary;;   CHEST WALL RECONSTRUCTION Left 03/12/2024   Procedure: RECONSTRUCTION, MAJOR, CHEST WALL;  Surgeon: Kerrin Elspeth BROCKS, MD;  Location: [facility] OR;  Service: Thoracic;  Laterality: Left;   COLONOSCOPY  09/07/2003   gessner - hx polyp; colonoscopy x several - last one on 06/04/21   cyst removal from gum     FRACTURE SURGERY Left age 71   Left Knee   FUDUCIAL PLACEMENT  01/17/2024   Procedure: INSERTION, FIDUCIAL MARKER, GOLD;  Surgeon: Shelah Lamar RAMAN, MD;  Location: [facility] ENDOSCOPY;  Service: Pulmonary;;   HERNIA REPAIR     INTERCOSTAL NERVE BLOCK Left 03/12/2024   Procedure: BLOCK, NERVE, INTERCOSTAL;  Surgeon: Kerrin Elspeth BROCKS, MD;  Location: [facility] OR;  Service: Thoracic;  Laterality: Left;   LOBECTOMY, LUNG, ROBOT-ASSISTED, USING VATS Left 03/12/2024   Procedure: LOBECTOMY, LUNG, ROBOT-ASSISTED, USING VATS;  Surgeon: Kerrin Elspeth BROCKS, MD;  Location: [facility] OR;  Service: Thoracic;  Laterality: Left;  LEFT ROBOTIC LEFT UPPER LOBECTOMY, POSSIBLE THORACOTOMY, POSSIBLE CHEST WALL RESECTION   LYMPH NODE BIOPSY Left 03/12/2024   Procedure: LYMPH NODE BIOPSY;  Surgeon: Kerrin Elspeth BROCKS, MD;  Location: [facility] OR;  Service: Thoracic;  Laterality: Left;   THORACOTOMY Left 03/12/2024   Procedure: THORACOTOMY, MAJOR;  Surgeon:  Kerrin Elspeth BROCKS, MD;  Location[facility] OR;  Service: Thoracic;  Laterality: Left;   UPPER GASTROINTESTINAL ENDOSCOPY     UPPER GI ENDOSCOPY     Normal   VIDEO BRONCHOSCOPY WITH ENDOBRONCHIAL NAVIGATION Left 01/17/2024   Procedure: VIDEO BRONCHOSCOPY WITH ENDOBRONCHIAL NAVIGATION;  Surgeon: Shelah Lamar RAMAN, MD;  Location: [facility] ENDOSCOPY;  Service: Pulmonary;  Laterality: Left;   WISDOM TOOTH EXTRACTION      Family History  Problem Relation Age of Onset   Heart disease Mother    Diabetes Mother    Hyperlipidemia Father    Colon cancer Neg Hx    Esophageal cancer Neg Hx    Rectal cancer Neg Hx    Stomach cancer Neg Hx    Pancreatic cancer Neg Hx    Prostate cancer Neg Hx    Colon polyps Neg Hx     Social History Social History   Tobacco Use   Smoking status: Former    Current packs/day: 0.00    Average packs/day: 1 pack/day for 45.0 years (45.0 ttl pk-yrs)    Types: Cigarettes    Quit date: 01/14/2024    Years since quitting: 0.2   Smokeless tobacco: Never  Vaping Use   Vaping status: Never Used  Substance Use Topics   Alcohol use: Yes    Alcohol/week: 14.0 standard drinks of alcohol    Types: 14 Shots of liquor per week    Comment: liquor   Drug use: Never    No Known Allergies  Current Outpatient Medications  Medication Sig Dispense Refill   amiodarone  (PACERONE ) 200 MG tablet Take 2 tablets (400 mg total) by mouth 2 (two) times daily. For 2 weeks then decrease the dose to 1 tablet (200mg ) once daily. 60 tablet 1   amLODipine  (NORVASC ) 5 MG tablet TAKE 1 TABLET EVERY DAY FOR BLOOD PRESSURE 90 tablet 3   ascorbic acid (VITAMIN C) 500 MG tablet Take 500 mg by mouth daily. Takes every other day     aspirin  EC 81 MG tablet Take 81 mg by mouth every other day.      Cholecalciferol (VITAMIN D3) 125 MCG (5000 UT) CAPS Take 5,000 Units by mouth every other day.     cyanocobalamin  (VITAMIN B12) 1000 MCG tablet Take 1,000 mcg by mouth every other day.     olmesartan  (BENICAR )  40 MG tablet Take 1 tablet (40 mg total) by mouth daily. For blood pressure (Patient not taking: Reported on 04/11/2024) 90 tablet 3   rosuvastatin  (CRESTOR ) 10 MG tablet Take 1 tablet (10 mg total) by mouth every other day. 90 tablet 1   No current facility-administered medications for this visit.    Review of Systems  Constitutional: positive for fatigue Eyes: negative Ears, nose, mouth,  throat, and face: negative Respiratory: positive for pleurisy/chest pain Cardiovascular: negative Gastrointestinal: negative Genitourinary:negative Integument/breast: negative Hematologic/lymphatic: negative Musculoskeletal:negative Neurological: negative Behavioral/Psych: negative Endocrine: negative Allergic/Immunologic: negative  Physical Exam  MJO:jozmu, healthy, no distress, well nourished, and well developed SKIN: skin color, texture, turgor are normal, no rashes or significant lesions HEAD: Normocephalic, No masses, lesions, tenderness or abnormalities EYES: normal, PERRLA, Conjunctiva are pink and non-injected EARS: External ears normal, Canals clear OROPHARYNX:no exudate, no erythema, and lips, buccal mucosa, and tongue normal  NECK: supple, no adenopathy, no JVD LYMPH:  no palpable lymphadenopathy, no hepatosplenomegaly BREAST:not examined LUNGS: clear to auscultation , and palpation HEART: regular rate & rhythm, no murmurs, and no gallops ABDOMEN:abdomen soft, non-tender, normal bowel sounds, and no masses or organomegaly BACK: Back symmetric, no curvature., No CVA tenderness EXTREMITIES:no joint deformities, effusion, or inflammation, no edema  NEURO: alert & oriented x 3 with fluent speech, no focal motor/sensory deficits  PERFORMANCE STATUS: ECOG 1  LABORATORY DATA: Lab Results  Component Value Date   WBC 9.6 04/11/2024   HGB 12.8 (L) 04/11/2024   HCT 39.2 04/11/2024   MCV 95 04/11/2024   PLT 289 04/11/2024      Chemistry      Component Value Date/Time   NA 139  04/11/2024 0917   K 4.8 04/11/2024 0917   CL 101 04/11/2024 0917   CO2 21 04/11/2024 0917   BUN 16 04/11/2024 0917   CREATININE 0.95 04/11/2024 0917      Component Value Date/Time   CALCIUM  9.0 04/11/2024 0917   ALKPHOS 101 04/11/2024 0917   AST 19 04/11/2024 0917   ALT 14 04/11/2024 0917   BILITOT 0.4 04/11/2024 0917       RADIOGRAPHIC STUDIES: DG Chest 2 View Result Date: 04/10/2024 EXAM: 2 VIEW(S) XRAY OF THE CHEST 04/10/2024 08:57:03 AM COMPARISON: 03/26/2024 CLINICAL HISTORY: Lung nodule. FINDINGS: LUNGS AND PLEURA: Left hydropneumothorax again identified. The apical pleural air component is decreased with the visceral pleural line 3.0 cm from the chest wall today versus 4.1 cm previously. Clear right lung. HEART AND MEDIASTINUM: Mild mediastinal shift to the left with volume loss in the left upper chest. BONES AND SOFT TISSUES: Removal of left chest tube. IMPRESSION: 1. Left hydropneumothorax, decreased after left chest tube removal . Electronically signed by: Rockey Kilts MD 04/10/2024 09:36 AM EDT RP Workstation: [id number]   DG Chest 2 View Result Date: 03/26/2024 CLINICAL DATA:  Status post left upper lobectomy EXAM: CHEST - 2 VIEW COMPARISON:  Chest radiograph dated 03/22/2024 FINDINGS: Lines/tubes: Similar position of left upper lung pleural catheter. Decreased but persistent trace left supraclavicular and lateral chest wall subcutaneous emphysema. Lungs: Postsurgical changes of left upper lobectomy. No focal consolidation. Pleura: New blunting of the left costophrenic angle. Similar appearance of left pneumothorax. Heart/mediastinum: Similar  cardiomediastinal silhouette. Bones: No acute osseous abnormality. IMPRESSION: 1. Similar appearance of left pneumothorax with left upper lung pleural catheter in place. 2. New blunting of the left costophrenic angle, which may represent increased small pleural effusion. Electronically Signed   By: Limin  Xu M.D.   On: 03/26/2024 15:52   DG  Chest 2 View Result Date: 03/22/2024 CLINICAL DATA:  Status post lobectomy EXAM: CHEST - 2 VIEW COMPARISON:  03/19/2024, 03/08/2024, 03/12/2024 and prior serial radiographs FINDINGS: Removal of left-sided central venous catheter. Slight retraction of left-sided chest tube compared to prior. Interval air-fluid level in the left upper chest consistent with moderate, probably loculated hydropneumothorax. Asymmetrical hazy appearance of the left mid to lower lung  likely due to pleural fluid. Right lung is grossly clear. Residual but slightly decreased left neck and chest wall emphysema. Possible trace pneumomediastinum, does not appear worsened. Postsurgical changes of left chest wall. IMPRESSION: 1. Slight retraction of left-sided chest tube compared to prior. Interval air-fluid level in the left upper chest consistent with moderate, probably loculated hydropneumothorax. The pneumothorax component appears slightly diminished compared with radiograph several days prior. 2. Asymmetrical hazy appearance of the left mid to lower lung likely due to pleural fluid. 3. Residual but slightly decreased left neck and chest wall emphysema. Possible trace pneumomediastinum, this does not appear worsened. Electronically Signed   By: Luke Bun M.D.   On: 03/22/2024 15:21   DG CHEST PORT 1 VIEW Result Date: 03/19/2024 CLINICAL DATA:  Left pneumothorax. Postop day 7 status post left upper lobectomy and chest wall reconstruction. EXAM: PORTABLE CHEST 1 VIEW COMPARISON:  03/18/2024 FINDINGS: Left chest tube remains in place. Roughly stable about 40% left pneumothorax. Stable left rib deformities. Minimally reduced left subcutaneous emphysema. Left subclavian central line tip remains about at the level of the SVC confluence, orientation essentially about 30 degrees off of perpendicular with the lateral margin of the SVC, although not necessarily abutting the lateral wall. Reduce conspicuity of pneumomediastinum. Atherosclerotic  calcification of the aortic arch. Right lung remains clear. IMPRESSION: 1. Roughly stable about 40% left pneumothorax with left chest tube in place. 2. Minimally reduced left subcutaneous emphysema. 3. Reduced conspicuity of pneumomediastinum. 4. Left subclavian central line tip remains about at the level of the SVC confluence, orientation essentially about 30 degrees off of perpendicular with the lateral margin of the SVC, although not necessarily abutting the lateral wall. Electronically Signed   By: Ryan Salvage M.D.   On: 03/19/2024 08:55   DG Chest Port 1 View Result Date: 03/18/2024 CLINICAL DATA:  Status post lobectomy. EXAM: PORTABLE CHEST 1 VIEW COMPARISON:  03/17/2024 FINDINGS: Left chest tube remains in place with stable to minimal decrease in left pneumothorax. Soft tissue gas in the left chest wall is similar to prior. Right lung is hyperexpanded but clear. Similar appearance of pneumomediastinum. The cardiopericardial silhouette is within normal limits for size. Left subclavian central line tip projects in the region of the innominate vein confluence. Telemetry leads overlie the chest. IMPRESSION: 1. Stable to minimal decrease in left pneumothorax with left chest tube in place. 2. Similar appearance of pneumomediastinum. 3. Left subclavian central line tip projects in the region of the innominate vein confluence. Electronically Signed   By: Camellia Candle M.D.   On: 03/18/2024 08:34   DG Chest 2 View Result Date: 03/17/2024 CLINICAL DATA:  [id number] Status post lobectomy of lung [id number] Follow-up exam [id number] EXAM: CHEST - 2 VIEW COMPARISON:  March 16, 2024 FINDINGS: The cardiomediastinal silhouette is unchanged in contour.LEFT subclavian CVC tip terminates over the confluence of the LEFT brachiocephalic vein and SVC. LEFT-sided chest tube. Similar high density along the LEFT lateral lung, likely loculated fluid. Similar moderate LEFT pneumothorax. Extensive LEFT-sided subcutaneous air. No  acute pleuroparenchymal abnormality. Mild multilevel degenerative changes of the thoracic spine. Visualized abdomen is unremarkable. IMPRESSION: Similar moderate LEFT pneumothorax with chest tube in place. Electronically Signed   By: Corean Salter M.D.   On: 03/17/2024 10:08   DG Chest 1 View Result Date: 03/16/2024 CLINICAL DATA:  Status post left upper lobe resection. EXAM: CHEST  1 VIEW COMPARISON:  Prior chest x-ray yesterday 03/15/2024 FINDINGS: Stable position of left subclavian approach central venous catheter. Catheter  tip overlies the confluence of the left innominate vein and the SVC. Left-sided chest tube remains in place. Persistent large left pneumothorax which may be slightly larger on today's examination. Persistent subcutaneous emphysema along the soft tissues of the left neck and left chest. The right lung remains well aerated. IMPRESSION: 1. Similar to slightly worsened moderately large left pneumothorax with chest tube in place. 2. Stable position of left subclavian central venous catheter. 3. Persistent extensive subcutaneous emphysema in the soft tissues of the left neck and chest. Electronically Signed   By: Wilkie Lent M.D.   On: 03/16/2024 08:28   DG Chest 1 View Result Date: 03/15/2024 CLINICAL DATA:  Postop day 3 status post left upper lobectomy and chest wall reconstruction EXAM: CHEST  1 VIEW COMPARISON:  03/14/2024 FINDINGS: Left subclavian central line tip: Brachiocephalic vein. Left chest tube in place with substantial left subcutaneous emphysema. Atherosclerotic calcification of the aortic arch. Pulmonary emphysema. Continued volume loss in the left hemithorax with reduced size of the moderate left pneumothorax. Bandlike opacities in the left lower lobe probably from atelectasis. The right lung appears clear. Heart size within normal limits. Left rib deformities unchanged. IMPRESSION: 1. Reduced size of the moderate left pneumothorax with left chest tube in place.  Substantial left subcutaneous emphysema. 2. Bandlike opacities in the left lower lobe probably from atelectasis. 3.  Emphysema (ICD10-J43.9). 4. Aortic Atherosclerosis (ICD10-I70.0). Electronically Signed   By: Ryan Salvage M.D.   On: 03/15/2024 08:18   DG Chest 1 View Result Date: 03/14/2024 CLINICAL DATA:  Status post lobectomy. EXAM: CHEST  1 VIEW COMPARISON:  03/13/2024 FINDINGS: Left chest tube remains in place with similar appearance of left-sided pneumothorax. Streaky density in the lung bases is compatible with atelectasis. Left subclavian central line tip overlies the right paramidline mediastinum, near the expected location of the innominate vein confluence. Soft tissue gas in the left chest wall similar to prior. Telemetry leads overlie the chest. IMPRESSION: No significant interval change. Left chest tube remains in place with similar appearance of moderate left-sided pneumothorax. Electronically Signed   By: Camellia Candle M.D.   On: 03/14/2024 09:53    ASSESSMENT: This is a very pleasant 73 years old white male recently diagnosed with stage IIb (T3, N0, M0) non-small cell lung cancer, squamous cell carcinoma presented with large cavitary left upper lobe lung mass status post left upper lobectomy with resection of the left lower lobe in addition to en bloc resection of the chest wall including left 3rd and 4th rib on 03/12/2024 under the care of Dr. Kerrin PD-L1 expression was 10%   PLAN: I had a lengthy discussion with the patient and his family today about his current disease stage, prognosis and treatment options.  I discussed with him the benefit of adjuvant systemic chemotherapy as well as adjuvant immunotherapy. He is interested in treatment and expected to start adjuvant systemic chemotherapy with cisplatin 75 Mg/M2 and docetaxel 75 Mg/M2 with Neulasta support on 05/01/2024. 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 Tumor was 6.2 cm, T3, with involvement of the chest wall. No current symptoms such as shortness of breath, hemoptysis, nausea, vomiting, diarrhea, or headaches. No significant weight loss. Radiation not indicated due to clear resection margins. - Initiate chemotherapy with cisplatin and docetaxel in 2-3 weeks, pending recovery. - Administer Neulasta injection two days post-chemotherapy to boost white blood cell count. - Conduct weekly lab work to monitor  blood counts, kidney, and liver function. - Provide Decadron  4 mg tablets for three days around chemotherapy sessions. - Prescribe anti-nausea medication for home use. - Schedule follow-up scans at the end of chemotherapy and every three months for the first year. - Discuss potential immune therapy post-chemotherapy based on scan results and patient preference. The patient was advised to call immediately if he has any other concerning symptoms in the interval.  The patient voices understanding of current disease status and treatment options and is in agreement with the current care plan.  All questions were answered. The patient knows to call the clinic with any problems, questions or concerns. We can certainly see the patient much sooner if necessary.  Thank you so much for allowing me to participate in the care of Russell [name]. I will continue to follow up the patient with you and assist in his care. The total time spent in the appointment was 90 minutes including review of chart and various tests results, discussions about plan of care and coordination of care plan .   Disclaimer: This note was dictated with voice recognition software. Similar sounding words can inadvertently be transcribed and may not be corrected upon review.   Sherrod MARLA Sherrod April 12, 2024, 1:36 PM

## 2024-04-12 NOTE — Progress Notes (Signed)
 START ON PATHWAY REGIMEN - Non-Small Cell Lung     A cycle is every 21 days:     Docetaxel      Cisplatin   **Always confirm dose/schedule in your pharmacy ordering system**  Patient Characteristics: Postoperative without Neoadjuvant Therapy (Pathologic Staging), Stage IB (Tumor Size = 4 cm) or Stage II / III, Adjuvant Chemotherapy Planned, Stage IB (Tumor Size = 4 cm), or Stage II / III, Squamous Cell Therapeutic Status: Postoperative without Neoadjuvant Therapy (Pathologic Staging) AJCC M Category: cM0 AJCC 9 Stage Grouping: IIB AJCC N Category: pN0 AJCC T Category: pT3 Check here if patient was staged using an edition other than AJCC Staging 9th Edition: false Adjuvant Chemotherapy Status: Adjuvant Chemotherapy Planned Histology: Squamous Cell Intent of Therapy: Curative Intent, Discussed with Patient

## 2024-04-13 ENCOUNTER — Other Ambulatory Visit: Payer: Self-pay

## 2024-04-16 ENCOUNTER — Encounter: Payer: Self-pay | Admitting: Internal Medicine

## 2024-04-16 NOTE — Progress Notes (Signed)
 NN met with the Pt at his consult with Dr. Sherrod. Pt was accompanied by his dtr Russell [name], and his wife, Russell [name].  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 Plan for pt is to receive 4C of adjuvant chemotherapy with cis/docetaxel  followed by D3 neulasta . Pt will have a scan 3 weeks after the completion of his chemotherapy to assess response. Pt and family provided with a lung cancer journey book with NNs direct contact information, and a Living Well with Cancer booklet as well. NN escorted pt to scheduling to arrange patient education appt. Pt will be notified once his infusion schedule has been arranged. No questions or concerns at the conclusion of NN encounter. Pt and family encouraged to call for any assistance.

## 2024-04-19 DIAGNOSIS — J939 Pneumothorax, unspecified: Secondary | ICD-10-CM | POA: Diagnosis not present

## 2024-04-19 DIAGNOSIS — Z483 Aftercare following surgery for neoplasm: Secondary | ICD-10-CM | POA: Diagnosis not present

## 2024-04-19 DIAGNOSIS — I4891 Unspecified atrial fibrillation: Secondary | ICD-10-CM | POA: Diagnosis not present

## 2024-04-19 DIAGNOSIS — I700 Atherosclerosis of aorta: Secondary | ICD-10-CM | POA: Diagnosis not present

## 2024-04-19 DIAGNOSIS — C3412 Malignant neoplasm of upper lobe, left bronchus or lung: Secondary | ICD-10-CM | POA: Diagnosis not present

## 2024-04-19 DIAGNOSIS — J439 Emphysema, unspecified: Secondary | ICD-10-CM | POA: Diagnosis not present

## 2024-04-19 DIAGNOSIS — Z4803 Encounter for change or removal of drains: Secondary | ICD-10-CM | POA: Diagnosis not present

## 2024-04-19 DIAGNOSIS — Z4801 Encounter for change or removal of surgical wound dressing: Secondary | ICD-10-CM | POA: Diagnosis not present

## 2024-04-19 DIAGNOSIS — I10 Essential (primary) hypertension: Secondary | ICD-10-CM | POA: Diagnosis not present

## 2024-04-24 ENCOUNTER — Other Ambulatory Visit

## 2024-04-25 ENCOUNTER — Inpatient Hospital Stay

## 2024-04-26 ENCOUNTER — Other Ambulatory Visit

## 2024-04-26 NOTE — Progress Notes (Signed)
 Pharmacist Chemotherapy Monitoring - Initial Assessment    Anticipated start date: 05/03/24   The following has been reviewed per standard work regarding the patient's treatment regimen: The patient's diagnosis, treatment plan and drug doses, and organ/hematologic function Lab orders and baseline tests specific to treatment regimen  The treatment plan start date, drug sequencing, and pre-medications Prior authorization status  Patient's documented medication list, including drug-drug interaction screen and prescriptions for anti-emetics and supportive care specific to the treatment regimen The drug concentrations, fluid compatibility, administration routes, and timing of the medications to be used The patient's access for treatment and lifetime cumulative dose history, if applicable  The patient's medication allergies and previous infusion related reactions, if applicable   Changes made to treatment plan:  treatment plan date  Follow up needed:  N/A   Rin Gorton, Pharm.D., CPP 04/26/2024@10 :45 AM

## 2024-05-02 ENCOUNTER — Inpatient Hospital Stay: Admitting: Internal Medicine

## 2024-05-02 ENCOUNTER — Inpatient Hospital Stay

## 2024-05-02 VITALS — BP 149/64 | HR 55 | Temp 97.6°F | Resp 17 | Ht 68.0 in | Wt 146.6 lb

## 2024-05-02 DIAGNOSIS — Z4801 Encounter for change or removal of surgical wound dressing: Secondary | ICD-10-CM | POA: Diagnosis not present

## 2024-05-02 DIAGNOSIS — Z5111 Encounter for antineoplastic chemotherapy: Secondary | ICD-10-CM | POA: Diagnosis not present

## 2024-05-02 DIAGNOSIS — I4891 Unspecified atrial fibrillation: Secondary | ICD-10-CM | POA: Diagnosis not present

## 2024-05-02 DIAGNOSIS — I700 Atherosclerosis of aorta: Secondary | ICD-10-CM | POA: Diagnosis not present

## 2024-05-02 DIAGNOSIS — C3412 Malignant neoplasm of upper lobe, left bronchus or lung: Secondary | ICD-10-CM | POA: Diagnosis not present

## 2024-05-02 DIAGNOSIS — D649 Anemia, unspecified: Secondary | ICD-10-CM | POA: Diagnosis not present

## 2024-05-02 DIAGNOSIS — J939 Pneumothorax, unspecified: Secondary | ICD-10-CM | POA: Diagnosis not present

## 2024-05-02 DIAGNOSIS — I10 Essential (primary) hypertension: Secondary | ICD-10-CM | POA: Diagnosis not present

## 2024-05-02 DIAGNOSIS — Z4803 Encounter for change or removal of drains: Secondary | ICD-10-CM | POA: Diagnosis not present

## 2024-05-02 DIAGNOSIS — Z5189 Encounter for other specified aftercare: Secondary | ICD-10-CM | POA: Diagnosis not present

## 2024-05-02 DIAGNOSIS — J439 Emphysema, unspecified: Secondary | ICD-10-CM | POA: Diagnosis not present

## 2024-05-02 DIAGNOSIS — Z483 Aftercare following surgery for neoplasm: Secondary | ICD-10-CM | POA: Diagnosis not present

## 2024-05-02 LAB — CBC WITH DIFFERENTIAL (CANCER CENTER ONLY)
Abs Immature Granulocytes: 0.03 K/uL (ref 0.00–0.07)
Basophils Absolute: 0.1 K/uL (ref 0.0–0.1)
Basophils Relative: 1 %
Eosinophils Absolute: 0.4 K/uL (ref 0.0–0.5)
Eosinophils Relative: 4 %
HCT: 37.9 % — ABNORMAL LOW (ref 39.0–52.0)
Hemoglobin: 12.6 g/dL — ABNORMAL LOW (ref 13.0–17.0)
Immature Granulocytes: 0 %
Lymphocytes Relative: 13 %
Lymphs Abs: 1.2 K/uL (ref 0.7–4.0)
MCH: 29.9 pg (ref 26.0–34.0)
MCHC: 33.2 g/dL (ref 30.0–36.0)
MCV: 90 fL (ref 80.0–100.0)
Monocytes Absolute: 0.9 K/uL (ref 0.1–1.0)
Monocytes Relative: 9 %
Neutro Abs: 7 K/uL (ref 1.7–7.7)
Neutrophils Relative %: 73 %
Platelet Count: 274 K/uL (ref 150–400)
RBC: 4.21 MIL/uL — ABNORMAL LOW (ref 4.22–5.81)
RDW: 13.7 % (ref 11.5–15.5)
WBC Count: 9.6 K/uL (ref 4.0–10.5)
nRBC: 0 % (ref 0.0–0.2)

## 2024-05-02 LAB — CMP (CANCER CENTER ONLY)
ALT: 11 U/L (ref 0–44)
AST: 14 U/L — ABNORMAL LOW (ref 15–41)
Albumin: 3.9 g/dL (ref 3.5–5.0)
Alkaline Phosphatase: 72 U/L (ref 38–126)
Anion gap: 4 — ABNORMAL LOW (ref 5–15)
BUN: 16 mg/dL (ref 8–23)
CO2: 29 mmol/L (ref 22–32)
Calcium: 8.8 mg/dL — ABNORMAL LOW (ref 8.9–10.3)
Chloride: 104 mmol/L (ref 98–111)
Creatinine: 0.98 mg/dL (ref 0.61–1.24)
GFR, Estimated: >60 mL/min (ref >60)
Glucose, Bld: 102 mg/dL — ABNORMAL HIGH (ref 70–99)
Potassium: 4.8 mmol/L (ref 3.5–5.1)
Sodium: 137 mmol/L (ref 135–145)
Total Bilirubin: 0.4 mg/dL (ref 0.0–1.2)
Total Protein: 6.7 g/dL (ref 6.5–8.1)

## 2024-05-02 LAB — MAGNESIUM: Magnesium: 2 mg/dL (ref 1.7–2.4)

## 2024-05-02 NOTE — Progress Notes (Signed)
 [facility] [phone]   [phone]  OFFICE PROGRESS NOTE  Zollie Lowers, MD [address] [place] [address]  DIAGNOSIS: stage IIb (T3, N0, M0) non-small cell lung cancer, squamous cell carcinoma presented with large cavitary left upper lobe lung mass diagnosed in May 2025. PD-L1 expression was 10%  PRIOR THERAPY: Status post left upper lobectomy with resection of the left lower lobe in addition to en bloc resection of the chest wall including left 3rd and 4th rib on 03/12/2024 under the care of Dr. Kerrin  CURRENT THERAPY: Adjuvant systemic chemotherapy with cisplatin  75 Mg/M2 and docetaxel  75 Mg/M2 every 3 weeks.  First dose 05/03/2024.  INTERVAL HISTORY: Russell [name] 73 y.o. male returns to the clinic today for follow-up visit. Discussed the use of AI scribe software for clinical note transcription with the patient, who gave verbal consent to proceed.  History of Present Illness Russell [name] is a 73 year old male with stage 2B non-small cell lung cancer who presents for pre-chemotherapy evaluation.  He was diagnosed with stage 2B non-small cell lung cancer in May 2025 and underwent a left upper lobectomy with resection of the left lower lobe and en bloc resection of the chest wall, including the third and fourth ribs, on March 12, 2024. He is currently preparing for his first chemotherapy treatment scheduled for tomorrow.  He feels nervous about the upcoming treatment. No new chest pain, shortness of breath, or cough since his last visit, but he mentions a slight restriction in breathing over the past couple of weeks. He has not been using the spirometer to aid in lung expansion.     MEDICAL HISTORY: Past Medical History:  Diagnosis Date   Dyspnea    with exertion   GERD (gastroesophageal reflux disease)    History of hiatal hernia 07/22/2006   small - dx by endoscopy   Hx of adenomatous polyp of colon 03/24/2016   Hyperlipidemia     Hypertension    Lung nodule 01/04/2024   left upper lobe   Pneumonia    x 1 as teenager   Pneumothorax after biopsy 10/17/2023   Pulmonary nodule 04/27/2023   Status post lobectomy of lung 03/12/2024    ALLERGIES:  has no known allergies.  MEDICATIONS:  Current Outpatient Medications  Medication Sig Dispense Refill   amiodarone  (PACERONE ) 200 MG tablet Take 2 tablets (400 mg total) by mouth 2 (two) times daily. For 2 weeks then decrease the dose to 1 tablet (200mg ) once daily. 60 tablet 1   amLODipine  (NORVASC ) 5 MG tablet TAKE 1 TABLET EVERY DAY FOR BLOOD PRESSURE 90 tablet 3   ascorbic acid (VITAMIN C) 500 MG tablet Take 500 mg by mouth daily. Takes every other day     aspirin  EC 81 MG tablet Take 81 mg by mouth every other day.      Cholecalciferol (VITAMIN D3) 125 MCG (5000 UT) CAPS Take 5,000 Units by mouth every other day.     cyanocobalamin  (VITAMIN B12) 1000 MCG tablet Take 1,000 mcg by mouth every other day.     dexamethasone  (DECADRON ) 4 MG tablet Take 2 tabs (8 mg) twice daily starting the day before docetaxel , then daily x 3 days after cisplatin .Take with food. 30 tablet 1   olmesartan  (BENICAR ) 40 MG tablet Take 1 tablet (40 mg total) by mouth daily. For blood pressure 90 tablet 3   ondansetron  (ZOFRAN ) 8 MG tablet Take 1 tablet (8 mg  total) by mouth every 8 (eight) hours as needed for nausea or vomiting. Begin on the third day after cisplatin  chemotherapy. 30 tablet 1   prochlorperazine  (COMPAZINE ) 10 MG tablet Take 1 tablet (10 mg total) by mouth every 6 (six) hours as needed for nausea or vomiting. 30 tablet 1   rosuvastatin  (CRESTOR ) 10 MG tablet Take 1 tablet (10 mg total) by mouth every other day. 90 tablet 1   No current facility-administered medications for this visit.    SURGICAL HISTORY:  Past Surgical History:  Procedure Laterality Date   BRONCHIAL BIOPSY  10/17/2023   Procedure: BRONCHIAL BIOPSIES;  Surgeon: Shelah Lamar RAMAN, MD;  Location: [facility] ENDOSCOPY;   Service: Pulmonary;;   BRONCHIAL BIOPSY  01/17/2024   Procedure: BRONCHOSCOPY, WITH BIOPSY;  Surgeon: Shelah Lamar RAMAN, MD;  Location: [facility] ENDOSCOPY;  Service: Pulmonary;;   BRONCHIAL BRUSHINGS  10/17/2023   Procedure: BRONCHIAL BRUSHINGS;  Surgeon: Shelah Lamar RAMAN, MD;  Location: [facility] ENDOSCOPY;  Service: Pulmonary;;   BRONCHIAL BRUSHINGS  01/17/2024   Procedure: BRONCHOSCOPY, WITH BRUSH BIOPSY;  Surgeon: Shelah Lamar RAMAN, MD;  Location: [facility] ENDOSCOPY;  Service: Pulmonary;;   BRONCHIAL NEEDLE ASPIRATION BIOPSY  10/17/2023   Procedure: BRONCHIAL NEEDLE ASPIRATION BIOPSIES;  Surgeon: Shelah Lamar RAMAN, MD;  Location: [facility] ENDOSCOPY;  Service: Pulmonary;;   BRONCHIAL NEEDLE ASPIRATION BIOPSY  01/17/2024   Procedure: BRONCHOSCOPY, WITH NEEDLE ASPIRATION BIOPSY;  Surgeon: Shelah Lamar RAMAN, MD;  Location: [facility] ENDOSCOPY;  Service: Pulmonary;;   BRONCHIAL WASHINGS  10/17/2023   Procedure: BRONCHIAL WASHINGS;  Surgeon: Shelah Lamar RAMAN, MD;  Location: [facility] ENDOSCOPY;  Service: Pulmonary;;   BRONCHIAL WASHINGS  01/17/2024   Procedure: IRRIGATION, BRONCHUS;  Surgeon: Shelah Lamar RAMAN, MD;  Location: [facility] ENDOSCOPY;  Service: Pulmonary;;   CHEST WALL RECONSTRUCTION Left 03/12/2024   Procedure: RECONSTRUCTION, MAJOR, CHEST WALL;  Surgeon: Kerrin Elspeth BROCKS, MD;  Location: [facility] OR;  Service: Thoracic;  Laterality: Left;   COLONOSCOPY  09/07/2003   gessner - hx polyp; colonoscopy x several - last one on 06/04/21   cyst removal from gum     FRACTURE SURGERY Left age 54   Left Knee   FUDUCIAL PLACEMENT  01/17/2024   Procedure: INSERTION, FIDUCIAL MARKER, GOLD;  Surgeon: Shelah Lamar RAMAN, MD;  Location: [facility] ENDOSCOPY;  Service: Pulmonary;;   HERNIA REPAIR     INTERCOSTAL NERVE BLOCK Left 03/12/2024   Procedure: BLOCK, NERVE, INTERCOSTAL;  Surgeon: Kerrin Elspeth BROCKS, MD;  Location: [facility] OR;  Service: Thoracic;  Laterality: Left;   LOBECTOMY, LUNG, ROBOT-ASSISTED, USING VATS Left 03/12/2024   Procedure: LOBECTOMY, LUNG,  ROBOT-ASSISTED, USING VATS;  Surgeon: Kerrin Elspeth BROCKS, MD;  Location: [facility] OR;  Service: Thoracic;  Laterality: Left;  LEFT ROBOTIC LEFT UPPER LOBECTOMY, POSSIBLE THORACOTOMY, POSSIBLE CHEST WALL RESECTION   LYMPH NODE BIOPSY Left 03/12/2024   Procedure: LYMPH NODE BIOPSY;  Surgeon: Kerrin Elspeth BROCKS, MD;  Location[facility] OR;  Service: Thoracic;  Laterality: Left;   THORACOTOMY Left 03/12/2024   Procedure: THORACOTOMY, MAJOR;  Surgeon: Kerrin Elspeth BROCKS, MD;  Location: [facility] OR;  Service: Thoracic;  Laterality: Left;   UPPER GASTROINTESTINAL ENDOSCOPY     UPPER GI ENDOSCOPY     Normal   VIDEO BRONCHOSCOPY WITH ENDOBRONCHIAL NAVIGATION Left 01/17/2024   Procedure: VIDEO BRONCHOSCOPY WITH ENDOBRONCHIAL NAVIGATION;  Surgeon: Shelah Lamar RAMAN, MD;  Location: [facility] ENDOSCOPY;  Service: Pulmonary;  Laterality: Left;   WISDOM TOOTH EXTRACTION      REVIEW OF SYSTEMS:  A comprehensive review of systems was  negative except for: Respiratory: positive for dyspnea on exertion   PHYSICAL EXAMINATION: General appearance: alert, cooperative, and no distress Head: Normocephalic, without obvious abnormality, atraumatic Neck: no adenopathy, no JVD, supple, symmetrical, trachea midline, and thyroid  not enlarged, symmetric, no tenderness/mass/nodules Lymph nodes: Cervical, supraclavicular, and axillary nodes normal. Resp: clear to auscultation bilaterally Back: symmetric, no curvature. ROM normal. No CVA tenderness. Cardio: regular rate and rhythm, S1, S2 normal, no murmur, click, rub or gallop GI: soft, non-tender; bowel sounds normal; no masses,  no organomegaly Extremities: extremities normal, atraumatic, no cyanosis or edema  ECOG PERFORMANCE STATUS: 1 - Symptomatic but completely ambulatory  Blood pressure (!) 149/64, pulse (!) 55, temperature 97.6 F (36.4 C), resp. rate 17, height 5' 8 (1.727 m), weight 146 lb 9.6 oz (66.5 kg), SpO2 100%.  LABORATORY DATA: Lab Results  Component Value Date   WBC 9.6  05/02/2024   HGB 12.6 (L) 05/02/2024   HCT 37.9 (L) 05/02/2024   MCV 90.0 05/02/2024   PLT 274 05/02/2024      Chemistry      Component Value Date/Time   NA 139 04/11/2024 0917   K 4.8 04/11/2024 0917   CL 101 04/11/2024 0917   CO2 21 04/11/2024 0917   BUN 16 04/11/2024 0917   CREATININE 0.95 04/11/2024 0917      Component Value Date/Time   CALCIUM  9.0 04/11/2024 0917   ALKPHOS 101 04/11/2024 0917   AST 19 04/11/2024 0917   ALT 14 04/11/2024 0917   BILITOT 0.4 04/11/2024 0917       RADIOGRAPHIC STUDIES: DG Chest 2 View Result Date: 04/10/2024 EXAM: 2 VIEW(S) XRAY OF THE CHEST 04/10/2024 08:57:03 AM COMPARISON: 03/26/2024 CLINICAL HISTORY: Lung nodule. FINDINGS: LUNGS AND PLEURA: Left hydropneumothorax again identified. The apical pleural air component is decreased with the visceral pleural line 3.0 cm from the chest wall today versus 4.1 cm previously. Clear right lung. HEART AND MEDIASTINUM: Mild mediastinal shift to the left with volume loss in the left upper chest. BONES AND SOFT TISSUES: Removal of left chest tube. IMPRESSION: 1. Left hydropneumothorax, decreased after left chest tube removal . Electronically signed by: Rockey Kilts MD 04/10/2024 09:36 AM EDT RP Workstation: [id number]    ASSESSMENT AND PLAN: This is a very pleasant 73 years old white male with stage IIb (T3, N0, M0) non-small cell lung cancer, squamous cell carcinoma presented with large cavitary left upper lobe lung mass status post left upper lobectomy with resection of the left lower lobe in addition to en bloc resection of the chest wall including left 3rd and 4th rib on 03/12/2024 under the care of Dr. Kerrin PD-L1 expression was 10%  Assessment and Plan Assessment & Plan Stage IIB non-small cell lung cancer, post left upper lobectomy Status post left upper lobectomy with resection of the left lower lobe and en bloc resection of the chest wall, including the third and fourth rib. Scheduled to begin  chemotherapy tomorrow. No new chest pain, shortness of breath, or cough. Mild restriction in breathing, possibly due to lack of spirometer use. Chemotherapy regimen includes two drugs known to suppress blood counts, necessitating supportive measures to boost white blood cell recovery. - Administer chemotherapy as scheduled. - Instruct to take Decadron  4 mg, two tablets in the morning and two in the evening, starting today, the day of chemotherapy, and the day after chemotherapy. - Administer injection to boost white blood cell count on Saturday. - Advise to use Tylenol  and Claritin for any aching pain post-injection. -  Encourage use of spirometer to help expand lung capacity.  Mild anemia after surgery Mild anemia likely due to blood loss during surgery. Current blood count is adequate for chemotherapy treatment. The patient was advised to call immediately if she has any concerning symptoms in the interval. The patient voices understanding of current disease status and treatment options and is in agreement with the current care plan.  All questions were answered. The patient knows to call the clinic with any problems, questions or concerns. We can certainly see the patient much sooner if necessary.  The total time spent in the appointment was 20 minutes including review of chart and various tests results, discussions about plan of care and coordination of care plan .   Disclaimer: This note was dictated with voice recognition software. Similar sounding words can inadvertently be transcribed and may not be corrected upon review.

## 2024-05-03 ENCOUNTER — Inpatient Hospital Stay

## 2024-05-03 VITALS — BP 123/64 | HR 62 | Temp 97.9°F | Resp 15

## 2024-05-03 DIAGNOSIS — I10 Essential (primary) hypertension: Secondary | ICD-10-CM | POA: Diagnosis not present

## 2024-05-03 DIAGNOSIS — D649 Anemia, unspecified: Secondary | ICD-10-CM | POA: Diagnosis not present

## 2024-05-03 DIAGNOSIS — Z5111 Encounter for antineoplastic chemotherapy: Secondary | ICD-10-CM | POA: Diagnosis not present

## 2024-05-03 DIAGNOSIS — Z5189 Encounter for other specified aftercare: Secondary | ICD-10-CM | POA: Diagnosis not present

## 2024-05-03 DIAGNOSIS — C3412 Malignant neoplasm of upper lobe, left bronchus or lung: Secondary | ICD-10-CM | POA: Diagnosis not present

## 2024-05-03 MED ORDER — PALONOSETRON HCL INJECTION 0.25 MG/5ML
0.2500 mg | Freq: Once | INTRAVENOUS | Status: AC
  Administered 2024-05-03: 0.25 mg via INTRAVENOUS
  Filled 2024-05-03: qty 5

## 2024-05-03 MED ORDER — POTASSIUM CHLORIDE IN NACL 20-0.9 MEQ/L-% IV SOLN
Freq: Once | INTRAVENOUS | Status: AC
  Filled 2024-05-03: qty 1000

## 2024-05-03 MED ORDER — SODIUM CHLORIDE 0.9 % IV SOLN
INTRAVENOUS | Status: DC
Start: 2024-05-03 — End: 2024-05-03

## 2024-05-03 MED ORDER — SODIUM CHLORIDE 0.9 % IV SOLN
75.0000 mg/m2 | Freq: Once | INTRAVENOUS | Status: AC
  Administered 2024-05-03: 133 mg via INTRAVENOUS
  Filled 2024-05-03: qty 133

## 2024-05-03 MED ORDER — MAGNESIUM SULFATE 2 GM/50ML IV SOLN
2.0000 g | Freq: Once | INTRAVENOUS | Status: AC
  Administered 2024-05-03: 2 g via INTRAVENOUS
  Filled 2024-05-03: qty 50

## 2024-05-03 MED ORDER — APREPITANT 130 MG/18ML IV EMUL
130.0000 mg | Freq: Once | INTRAVENOUS | Status: AC
  Administered 2024-05-03: 130 mg via INTRAVENOUS
  Filled 2024-05-03: qty 18

## 2024-05-03 MED ORDER — DEXAMETHASONE SODIUM PHOSPHATE 10 MG/ML IJ SOLN
10.0000 mg | Freq: Once | INTRAMUSCULAR | Status: AC
  Administered 2024-05-03: 10 mg via INTRAVENOUS
  Filled 2024-05-03: qty 1

## 2024-05-03 MED ORDER — SODIUM CHLORIDE 0.9 % IV SOLN
75.0000 mg/m2 | Freq: Once | INTRAVENOUS | Status: AC
  Administered 2024-05-03: 133 mg via INTRAVENOUS
  Filled 2024-05-03: qty 13.3

## 2024-05-03 NOTE — Patient Instructions (Signed)
 [facility] - A DEPT OF [facility]  Discharge Instructions: Thank you for choosing [facility] to provide your oncology and hematology care.   If you have a lab appointment with the Cancer Center, please go directly to the Cancer Center and check in at the registration area.   Wear comfortable clothing and clothing appropriate for easy access to any Portacath or PICC line.   We strive to give you quality time with your provider. You may need to reschedule your appointment if you arrive late (15 or more minutes).  Arriving late affects you and other patients whose appointments are after yours.  Also, if you miss three or more appointments without notifying the office, you may be dismissed from the clinic at the provider's discretion.      For prescription refill requests, have your pharmacy contact our office and allow 72 hours for refills to be completed.    Today you received the following chemotherapy and/or immunotherapy agents: Docetaxel , Cisplatin       To help prevent nausea and vomiting after your treatment, we encourage you to take your nausea medication as directed.  BELOW ARE SYMPTOMS THAT SHOULD BE REPORTED IMMEDIATELY: *FEVER GREATER THAN 100.4 F (38 C) OR HIGHER *CHILLS OR SWEATING *NAUSEA AND VOMITING THAT IS NOT CONTROLLED WITH YOUR NAUSEA MEDICATION *UNUSUAL SHORTNESS OF BREATH *UNUSUAL BRUISING OR BLEEDING *URINARY PROBLEMS (pain or burning when urinating, or frequent urination) *BOWEL PROBLEMS (unusual diarrhea, constipation, pain near the anus) TENDERNESS IN MOUTH AND THROAT WITH OR WITHOUT PRESENCE OF ULCERS (sore throat, sores in mouth, or a toothache) UNUSUAL RASH, SWELLING OR PAIN  UNUSUAL VAGINAL DISCHARGE OR ITCHING   Items with * indicate a potential emergency and should be followed up as soon as possible or go to the Emergency Department if any problems should occur.  Please show the CHEMOTHERAPY ALERT CARD or  IMMUNOTHERAPY ALERT CARD at check-in to the Emergency Department and triage nurse.  Should you have questions after your visit or need to cancel or reschedule your appointment, please contact [facility] - A DEPT OF [facility]  Dept: [phone]  and follow the prompts.  Office hours are 8:00 a.m. to 4:30 p.m. Monday - Friday. Please note that voicemails left after 4:00 p.m. may not be returned until the following business day.  We are closed weekends and major holidays. You have access to a nurse at all times for urgent questions. Please call the main number to the clinic Dept: [phone] and follow the prompts.   For any non-urgent questions, you may also contact your provider using MyChart. We now offer e-Visits for anyone 49 and older to request care online for non-urgent symptoms. For details visit [email or web address].   Also download the MyChart app! Go to the app store, search MyChart, open the app, select [place], and log in with your MyChart username and password.  Docetaxel  Injection What is this medication? DOCETAXEL  (doe se TAX el) treats some types of cancer. It works by slowing down the growth of cancer cells. This medicine may be used for other purposes; ask your health care provider or pharmacist if you have questions. COMMON BRAND NAME(S): BEIZRAY , Docefrez , Docivyx , Taxotere  What should I tell my care team before I take this medication? They need to know if you have any of these conditions: Kidney disease Liver disease Low white blood cell levels Tingling of the fingers or toes or  other nerve disorder An unusual or allergic reaction to docetaxel , polysorbate 80, other medications, foods, dyes, or preservatives Pregnant or trying to get pregnant Breast-feeding How should I use this medication? This medication is injected into a vein. It is given by your care team in a hospital or clinic setting. Talk to your care team about the  use of this medication in children. Special care may be needed. Overdosage: If you think you have taken too much of this medicine contact a poison control center or emergency room at once. NOTE: This medicine is only for you. Do not share this medicine with others. What if I miss a dose? Keep appointments for follow-up doses. It is important not to miss your dose. Call your care team if you are unable to keep an appointment. What may interact with this medication? Do not take this medication with any of the following: Live virus vaccines This medication may also interact with the following: Certain antibiotics, such as clarithromycin, telithromycin Certain antivirals for HIV or hepatitis Certain medications for fungal infections, such as itraconazole, ketoconazole, voriconazole Grapefruit juice Nefazodone Supplements, such as St. John's wort This list may not describe all possible interactions. Give your health care provider a list of all the medicines, herbs, non-prescription drugs, or dietary supplements you use. Also tell them if you smoke, drink alcohol, or use illegal drugs. Some items may interact with your medicine. What should I watch for while using this medication? This medication may make you feel generally unwell. This is not uncommon as chemotherapy can affect healthy cells as well as cancer cells. Report any side effects. Continue your course of treatment even though you feel ill unless your care team tells you to stop. You may need blood work done while you are taking this medication. This medication can cause serious side effects and infusion reactions. To reduce the risk, your care team may give you other medications to take before receiving this one. Be sure to follow the directions from your care team. This medication may increase your risk of getting an infection. Call your care team for advice if you get a fever, chills, sore throat, or other symptoms of a cold or flu. Do not  treat yourself. Try to avoid being around people who are sick. Avoid taking medications that contain aspirin , acetaminophen , ibuprofen, naproxen, or ketoprofen unless instructed by your care team. These medications may hide a fever. Be careful brushing or flossing your teeth or using a toothpick because you may get an infection or bleed more easily. If you have any dental work done, tell your dentist you are receiving this medication. Some products may contain alcohol. Ask your care team if this medication contains alcohol. Be sure to tell all care teams you are taking this medicine. Certain medications, like metronidazole and disulfiram, can cause an unpleasant reaction when taken with alcohol. The reaction includes flushing, headache, nausea, vomiting, sweating, and increased thirst. The reaction can last from 30 minutes to several hours. This medication may affect your coordination, reaction time, or judgement. Do not drive or operate machinery until you know how this medication affects you. Sit up or stand slowly to reduce the risk of dizzy or fainting spells. Drinking alcohol with this medication can increase the risk of these side effects. Talk to your care team about your risk of cancer. You may be more at risk for certain types of cancer if you take this medication. Talk to your care team if you wish to become pregnant  or think you might be pregnant. This medication can cause serious birth defects if taken during pregnancy or if you get pregnant within 2 months after stopping therapy. A negative pregnancy test is required before starting this medication. A reliable form of contraception is recommended while taking this medication and for 2 months after stopping it. Talk to your care team about reliable forms of contraception. Do not breast-feed while taking this medication and for 1 week after stopping therapy. Use a condom during sex and for 4 months after stopping therapy. Tell your care team right  away if you think your partner might be pregnant. This medication can cause serious birth defects. This medication may cause infertility. Talk to your care team if you are concerned about your fertility. What side effects may I notice from receiving this medication? Side effects that you should report to your care team as soon as possible: Allergic reactions--skin rash, itching, hives, swelling of the face, lips, tongue, or throat Change in vision such as blurry vision, seeing halos around lights, vision loss Infection--fever, chills, cough, or sore throat Infusion reactions--chest pain, shortness of breath or trouble breathing, feeling faint or lightheaded Low red blood cell level--unusual weakness or fatigue, dizziness, headache, trouble breathing Pain, tingling, or numbness in the hands or feet Painful swelling, warmth, or redness of the skin, blisters or sores at the infusion site Redness, blistering, peeling, or loosening of the skin, including inside the mouth Sudden or severe stomach pain, bloody diarrhea, fever, nausea, vomiting Swelling of the ankles, hands, or feet Tumor lysis syndrome (TLS)--nausea, vomiting, diarrhea, decrease in the amount of urine, dark urine, unusual weakness or fatigue, confusion, muscle pain or cramps, fast or irregular heartbeat, joint pain Unusual bruising or bleeding Side effects that usually do not require medical attention (report to your care team if they continue or are bothersome): Change in nail shape, thickness, or color Change in taste Hair loss Increased tears This list may not describe all possible side effects. Call your doctor for medical advice about side effects. You may report side effects to FDA at 1-800-FDA-1088. Where should I keep my medication? This medication is given in a hospital or clinic. It will not be stored at home. NOTE: This sheet is a summary. It may not cover all possible information. If you have questions about this  medicine, talk to your doctor, pharmacist, or health care provider.  2024 Elsevier/Gold Standard (2021-10-29 00:00:00)  Cisplatin  Injection What is this medication? CISPLATIN  (SIS pla tin) treats some types of cancer. It works by slowing down the growth of cancer cells. This medicine may be used for other purposes; ask your health care provider or pharmacist if you have questions. COMMON BRAND NAME(S): Platinol , Platinol  -AQ What should I tell my care team before I take this medication? They need to know if you have any of these conditions: Eye disease, vision problems Hearing problems Kidney disease Low blood counts, such as low white cells, platelets, or red blood cells Tingling of the fingers or toes, or other nerve disorder An unusual or allergic reaction to cisplatin , carboplatin, oxaliplatin, other medications, foods, dyes, or preservatives If you or your partner are pregnant or trying to get pregnant Breast-feeding How should I use this medication? This medication is injected into a vein. It is given by your care team in a hospital or clinic setting. Talk to your care team about the use of this medication in children. Special care may be needed. Overdosage: If you think you have  taken too much of this medicine contact a poison control center or emergency room at once. NOTE: This medicine is only for you. Do not share this medicine with others. What if I miss a dose? Keep appointments for follow-up doses. It is important not to miss your dose. Call your care team if you are unable to keep an appointment. What may interact with this medication? Do not take this medication with any of the following: Live virus vaccines This medication may also interact with the following: Certain antibiotics, such as amikacin, gentamicin, neomycin, polymyxin B, streptomycin, tobramycin, vancomycin Foscarnet This list may not describe all possible interactions. Give your health care provider a list  of all the medicines, herbs, non-prescription drugs, or dietary supplements you use. Also tell them if you smoke, drink alcohol, or use illegal drugs. Some items may interact with your medicine. What should I watch for while using this medication? Your condition will be monitored carefully while you are receiving this medication. You may need blood work done while taking this medication. This medication may make you feel generally unwell. This is not uncommon, as chemotherapy can affect healthy cells as well as cancer cells. Report any side effects. Continue your course of treatment even though you feel ill unless your care team tells you to stop. This medication may increase your risk of getting an infection. Call your care team for advice if you get a fever, chills, sore throat, or other symptoms of a cold or flu. Do not treat yourself. Try to avoid being around people who are sick. Avoid taking medications that contain aspirin , acetaminophen , ibuprofen, naproxen, or ketoprofen unless instructed by your care team. These medications may hide a fever. This medication may increase your risk to bruise or bleed. Call your care team if you notice any unusual bleeding. Be careful brushing or flossing your teeth or using a toothpick because you may get an infection or bleed more easily. If you have any dental work done, tell your dentist you are receiving this medication. Drink fluids as directed while you are taking this medication. This will help protect your kidneys. Call your care team if you get diarrhea. Do not treat yourself. Talk to your care team if you or your partner wish to become pregnant or think you might be pregnant. This medication can cause serious birth defects if taken during pregnancy and for 14 months after the last dose. A negative pregnancy test is required before starting this medication. A reliable form of contraception is recommended while taking this medication and for 14 months after  the last dose. Talk to your care team about effective forms of contraception. Do not father a child while taking this medication and for 11 months after the last dose. Use a condom during sex during this time period. Do not breast-feed while taking this medication. This medication may cause infertility. Talk to your care team if you are concerned about your fertility. What side effects may I notice from receiving this medication? Side effects that you should report to your care team as soon as possible: Allergic reactions--skin rash, itching, hives, swelling of the face, lips, tongue, or throat Eye pain, change in vision, vision loss Hearing loss, ringing in ears Infection--fever, chills, cough, sore throat, wounds that don't heal, pain or trouble when passing urine, general feeling of discomfort or being unwell Kidney injury--decrease in the amount of urine, swelling of the ankles, hands, or feet Low red blood cell level--unusual weakness or fatigue, dizziness, headache,  trouble breathing Painful swelling, warmth, or redness of the skin, blisters or sores at the infusion site Pain, tingling, or numbness in the hands or feet Unusual bruising or bleeding Side effects that usually do not require medical attention (report to your care team if they continue or are bothersome): Hair loss Nausea Vomiting This list may not describe all possible side effects. Call your doctor for medical advice about side effects. You may report side effects to FDA at 1-800-FDA-1088. Where should I keep my medication? This medication is given in a hospital or clinic. It will not be stored at home. NOTE: This sheet is a summary. It may not cover all possible information. If you have questions about this medicine, talk to your doctor, pharmacist, or health care provider.  2024 Elsevier/Gold Standard (2021-12-25 00:00:00)

## 2024-05-04 ENCOUNTER — Encounter: Payer: Self-pay | Admitting: Internal Medicine

## 2024-05-04 ENCOUNTER — Ambulatory Visit

## 2024-05-04 NOTE — Telephone Encounter (Signed)
 Called pt to see how he did with his recent treatment.  He reports having some diarrhea & hiccoughs.  The diarrhea seems to be less & he hasn't taken anything yet.  Discussed low fiber diet & increasing oral fluids & to call if not better.  Hiccoughs lasted well up into the morning.  He is coming for his injection tomorrow.  Reminded about taking claritin & then tyenol if needed.  Reminded of next appts.  Pt reports knowing how to reach us  if needed. Note routed to Dr Sherrod pod.

## 2024-05-04 NOTE — Progress Notes (Signed)
 [facility] OFFICE PROGRESS NOTE  Zollie Lowers, MD [address] [place] [address]  DIAGNOSIS: Stage IIb (T3, N0, M0) non-small cell lung cancer, squamous cell carcinoma presented with large cavitary left upper lobe lung mass diagnosed in May 2025. PD-L1 expression was 10%   PRIOR THERAPY: Status post left upper lobectomy with resection of the left lower lobe in addition to en bloc resection of the chest wall including left 3rd and 4th rib on 03/12/2024 under the care of Dr. Kerrin   CURRENT THERAPY: Adjuvant systemic chemotherapy with cisplatin  75 Mg/M2 and docetaxel  75 Mg/M2 every 3 weeks. First dose 05/03/2024. Status post 1 cycle.   INTERVAL HISTORY: Russell [name] 73 y.o. male returns to the clinic for a follow up visit.  The patient underwent his first cycle of adjuvant chemotherapy last week. He tolerated it fair.  He went to the ER twice. He went on 05/06/24 for hiccups and was given baclofen  which he is taking as needed.   He went to the ER today for diarrhea. He had been having diarrhea for 24 hours. He was given imodium  which has helped and received IVF. He does believe that helped. He only took the initial dose of imodium  and has not needed to take more. He denied associated fever, abdominal pain, or blood in the stool.   Chemotherapy has affected his taste, making it difficult for him to eat. He describes his taste as 'just gone' and is struggling with nutrition. He managed to eat some vegetables yesterday and had Jell-O and Gatorade last night. He also ate [employer] [place] fries and a fish sandwich. He does have some oral thrush.   No nausea or vomiting so far, and he has medications available if needed. He has experienced intermittent difficulty breathing, which he attributes to a previous surgery on July 7th. He uses an inhaler as needed, which seems to help. No unusual cough, chest pain, or hemoptysis.  He is monitoring for signs of infection, such as fever  or respiratory symptoms. He had neulasta  administered on 8/30. He is neutropenic per labs drawn in ER this AM.  He is here for 1 week follow up toxicity check.    MEDICAL HISTORY: Past Medical History:  Diagnosis Date   Dyspnea    with exertion   GERD (gastroesophageal reflux disease)    History of hiatal hernia 07/22/2006   small - dx by endoscopy   Hx of adenomatous polyp of colon 03/24/2016   Hyperlipidemia    Hypertension    Lung nodule 01/04/2024   left upper lobe   Pneumonia    x 1 as teenager   Pneumothorax after biopsy 10/17/2023   Pulmonary nodule 04/27/2023   Status post lobectomy of lung 03/12/2024    ALLERGIES:  has no known allergies.  MEDICATIONS:  Current Outpatient Medications  Medication Sig Dispense Refill   nystatin  (MYCOSTATIN ) 100000 UNIT/ML suspension Take 5 mLs (500,000 Units total) by mouth 4 (four) times daily. 60 mL 0   amiodarone  (PACERONE ) 200 MG tablet Take 2 tablets (400 mg total) by mouth 2 (two) times daily. For 2 weeks then decrease the dose to 1 tablet (200mg ) once daily. 60 tablet 1   amLODipine  (NORVASC ) 5 MG tablet TAKE 1 TABLET EVERY DAY FOR BLOOD PRESSURE 90 tablet 3   ascorbic acid (VITAMIN C) 500 MG tablet Take 500 mg by mouth daily. Takes every other day     aspirin  EC 81 MG tablet Take 81 mg by  mouth every other day.      baclofen  (LIORESAL ) 10 MG tablet Take 1 tablet (10 mg total) by mouth 3 (three) times daily. 30 each 0   Cholecalciferol (VITAMIN D3) 125 MCG (5000 UT) CAPS Take 5,000 Units by mouth every other day.     cyanocobalamin  (VITAMIN B12) 1000 MCG tablet Take 1,000 mcg by mouth every other day.     dexamethasone  (DECADRON ) 4 MG tablet Take 2 tabs (8 mg) twice daily starting the day before docetaxel , then daily x 3 days after cisplatin .Take with food. 30 tablet 1   loperamide  (IMODIUM ) 2 MG capsule Take 1 capsule (2 mg total) by mouth 4 (four) times daily as needed for diarrhea or loose stools. 12 capsule 0   olmesartan   (BENICAR ) 40 MG tablet Take 1 tablet (40 mg total) by mouth daily. For blood pressure 90 tablet 3   ondansetron  (ZOFRAN ) 8 MG tablet Take 1 tablet (8 mg total) by mouth every 8 (eight) hours as needed for nausea or vomiting. Begin on the third day after cisplatin  chemotherapy. 30 tablet 1   prochlorperazine  (COMPAZINE ) 10 MG tablet Take 1 tablet (10 mg total) by mouth every 6 (six) hours as needed for nausea or vomiting. 30 tablet 1   rosuvastatin  (CRESTOR ) 10 MG tablet Take 1 tablet (10 mg total) by mouth every other day. 90 tablet 1   No current facility-administered medications for this visit.    SURGICAL HISTORY:  Past Surgical History:  Procedure Laterality Date   BRONCHIAL BIOPSY  10/17/2023   Procedure: BRONCHIAL BIOPSIES;  Surgeon: Shelah Lamar RAMAN, MD;  Location: [facility] ENDOSCOPY;  Service: Pulmonary;;   BRONCHIAL BIOPSY  01/17/2024   Procedure: BRONCHOSCOPY, WITH BIOPSY;  Surgeon: Shelah Lamar RAMAN, MD;  Location: [facility] ENDOSCOPY;  Service: Pulmonary;;   BRONCHIAL BRUSHINGS  10/17/2023   Procedure: BRONCHIAL BRUSHINGS;  Surgeon: Shelah Lamar RAMAN, MD;  Location: [facility] ENDOSCOPY;  Service: Pulmonary;;   BRONCHIAL BRUSHINGS  01/17/2024   Procedure: BRONCHOSCOPY, WITH BRUSH BIOPSY;  Surgeon: Shelah Lamar RAMAN, MD;  Location: [facility] ENDOSCOPY;  Service: Pulmonary;;   BRONCHIAL NEEDLE ASPIRATION BIOPSY  10/17/2023   Procedure: BRONCHIAL NEEDLE ASPIRATION BIOPSIES;  Surgeon: Shelah Lamar RAMAN, MD;  Location: [facility] ENDOSCOPY;  Service: Pulmonary;;   BRONCHIAL NEEDLE ASPIRATION BIOPSY  01/17/2024   Procedure: BRONCHOSCOPY, WITH NEEDLE ASPIRATION BIOPSY;  Surgeon: Shelah Lamar RAMAN, MD;  Location: [facility] ENDOSCOPY;  Service: Pulmonary;;   BRONCHIAL WASHINGS  10/17/2023   Procedure: BRONCHIAL WASHINGS;  Surgeon: Shelah Lamar RAMAN, MD;  Location: [facility] ENDOSCOPY;  Service: Pulmonary;;   BRONCHIAL WASHINGS  01/17/2024   Procedure: IRRIGATION, BRONCHUS;  Surgeon: Shelah Lamar RAMAN, MD;  Location: [facility] ENDOSCOPY;  Service: Pulmonary;;    CHEST WALL RECONSTRUCTION Left 03/12/2024   Procedure: RECONSTRUCTION, MAJOR, CHEST WALL;  Surgeon: Kerrin Elspeth BROCKS, MD;  Location: [facility] OR;  Service: Thoracic;  Laterality: Left;   COLONOSCOPY  09/07/2003   gessner - hx polyp; colonoscopy x several - last one on 06/04/21   cyst removal from gum     FRACTURE SURGERY Left age 71   Left Knee   FUDUCIAL PLACEMENT  01/17/2024   Procedure: INSERTION, FIDUCIAL MARKER, GOLD;  Surgeon: Shelah Lamar RAMAN, MD;  Location: [facility] ENDOSCOPY;  Service: Pulmonary;;   HERNIA REPAIR     INTERCOSTAL NERVE BLOCK Left 03/12/2024   Procedure: BLOCK, NERVE, INTERCOSTAL;  Surgeon: Kerrin Elspeth BROCKS, MD;  Location: [facility] OR;  Service: Thoracic;  Laterality: Left;   LOBECTOMY, LUNG, ROBOT-ASSISTED, USING VATS Left 03/12/2024  Procedure: LOBECTOMY, LUNG, ROBOT-ASSISTED, USING VATS;  Surgeon: Kerrin Elspeth BROCKS, MD;  Location: [facility] OR;  Service: Thoracic;  Laterality: Left;  LEFT ROBOTIC LEFT UPPER LOBECTOMY, POSSIBLE THORACOTOMY, POSSIBLE CHEST WALL RESECTION   LYMPH NODE BIOPSY Left 03/12/2024   Procedure: LYMPH NODE BIOPSY;  Surgeon: Kerrin Elspeth BROCKS, MD;  Location: [facility] OR;  Service: Thoracic;  Laterality: Left;   THORACOTOMY Left 03/12/2024   Procedure: THORACOTOMY, MAJOR;  Surgeon: Kerrin Elspeth BROCKS, MD;  Location: [facility] OR;  Service: Thoracic;  Laterality: Left;   UPPER GASTROINTESTINAL ENDOSCOPY     UPPER GI ENDOSCOPY     Normal   VIDEO BRONCHOSCOPY WITH ENDOBRONCHIAL NAVIGATION Left 01/17/2024   Procedure: VIDEO BRONCHOSCOPY WITH ENDOBRONCHIAL NAVIGATION;  Surgeon: Shelah Lamar RAMAN, MD;  Location: [facility] ENDOSCOPY;  Service: Pulmonary;  Laterality: Left;   WISDOM TOOTH EXTRACTION      REVIEW OF SYSTEMS:   Review of Systems  Constitutional: Positive for decreased appetite and fatigue. Negative for appetite change, chills, fever and unexpected weight change.  HENT: Positive for taste alterations. Negative for mouth sores, nosebleeds, sore throat and trouble swallowing.    Eyes: Negative for eye problems and icterus.  Respiratory: Occasional shortness of breath. Negative for cough, hemoptysis,  and wheezing.   Cardiovascular: Negative for chest pain and leg swelling.  Gastrointestinal: Positive for diarrhea (resolved). Negative for abdominal pain, constipation, nausea and vomiting.  Genitourinary: Negative for bladder incontinence, difficulty urinating, dysuria, frequency and hematuria.   Musculoskeletal: Negative for back pain, gait problem, neck pain and neck stiffness.  Skin: Negative for itching and rash.  Neurological: Negative for dizziness, extremity weakness, gait problem, headaches, light-headedness and seizures.  Hematological: Negative for adenopathy. Does not bruise/bleed easily.  Psychiatric/Behavioral: Negative for confusion, depression and sleep disturbance. The patient is not nervous/anxious.     PHYSICAL EXAMINATION:  Blood pressure (!) 136/52, pulse 65, temperature 97.8 F (36.6 C), temperature source Temporal, resp. rate 13, weight 145 lb 8 oz (66 kg), SpO2 97%.  ECOG PERFORMANCE STATUS: 1  Physical Exam  Constitutional: Oriented to person, place, and time and well-developed, well-nourished, and in no distress.  HENT:  Head: Normocephalic and atraumatic.  Mouth/Throat: Oropharynx is clear and moist. No oropharyngeal exudate.  Eyes: Conjunctivae are normal. Right eye exhibits no discharge. Left eye exhibits no discharge. No scleral icterus.  Neck: Normal range of motion. Neck supple.  Cardiovascular: Normal rate, regular rhythm, normal heart sounds and intact distal pulses.   Pulmonary/Chest: Effort normal and breath sounds normal. No respiratory distress. No wheezes. No rales.  Abdominal: Soft. Bowel sounds are normal. Exhibits no distension and no mass. There is no tenderness.  Musculoskeletal: Normal range of motion. Exhibits no edema.  Lymphadenopathy:    No cervical adenopathy.  Neurological: Alert and oriented to person, place,  and time. Exhibits normal muscle tone. Gait normal. Coordination normal.  Skin: Skin is warm and dry. No rash noted. Not diaphoretic. No erythema. No pallor.  Psychiatric: Mood, memory and judgment normal.  Vitals reviewed.  LABORATORY DATA: Lab Results  Component Value Date   WBC 1.0 (LL) 05/09/2024   HGB 12.6 (L) 05/09/2024   HCT 38.1 (L) 05/09/2024   MCV 92.0 05/09/2024   PLT 160 05/09/2024      Chemistry      Component Value Date/Time   NA 131 (L) 05/09/2024 0819   NA 139 04/11/2024 0917   K 3.8 05/09/2024 0819   CL 96 (L) 05/09/2024 0819   CO2 23 05/09/2024 0819   BUN  15 05/09/2024 0819   BUN 16 04/11/2024 0917   CREATININE 0.86 05/09/2024 0819   CREATININE 0.98 05/02/2024 0736      Component Value Date/Time   CALCIUM  8.1 (L) 05/09/2024 0819   ALKPHOS 62 05/09/2024 0819   AST 15 05/09/2024 0819   AST 14 (L) 05/02/2024 0736   ALT 14 05/09/2024 0819   ALT 11 05/02/2024 0736   BILITOT 0.7 05/09/2024 0819   BILITOT 0.4 05/02/2024 0736       RADIOGRAPHIC STUDIES:  DG Chest Portable 1 View Result Date: 05/06/2024 CLINICAL DATA:  Hiccups. Active chemotherapy. Left upper lobectomy last month. EXAM: PORTABLE CHEST 1 VIEW COMPARISON:  Chest radiograph 04/10/2024, additional prior radiographs reviewed. Most recent chest CT 02/29/2024 FINDINGS: Decreased left apical hydropneumothorax. Residual air-filled cavity is seen at the apex, previous pleural line is less well-defined. Pleural thickening about the lateral left hemithorax, similar. Stable left apical volume loss. Stable heart size and mediastinal contours. Aortic atherosclerosis. No focal right lung opacity. No pulmonary edema. IMPRESSION: 1. Decreased left apical hydropneumothorax. Residual air-filled cavity at the apex, previous pleural line is less well-defined. 2. Stable left apical postsurgical volume loss. Electronically Signed   By: Andrea Gasman M.D.   On: 05/06/2024 16:06   DG Chest 2 View Result Date:  04/10/2024 EXAM: 2 VIEW(S) XRAY OF THE CHEST 04/10/2024 08:57:03 AM COMPARISON: 03/26/2024 CLINICAL HISTORY: Lung nodule. FINDINGS: LUNGS AND PLEURA: Left hydropneumothorax again identified. The apical pleural air component is decreased with the visceral pleural line 3.0 cm from the chest wall today versus 4.1 cm previously. Clear right lung. HEART AND MEDIASTINUM: Mild mediastinal shift to the left with volume loss in the left upper chest. BONES AND SOFT TISSUES: Removal of left chest tube. IMPRESSION: 1. Left hydropneumothorax, decreased after left chest tube removal . Electronically signed by: Rockey Kilts MD 04/10/2024 09:36 AM EDT RP Workstation: [id number]     ASSESSMENT/PLAN:  This is a very pleasant 73 year old Caucasian male with stage IIb (T3, N0, M0) non-small cell lung cancer, squamous cell carcinoma presented with large cavitary left upper lobe lung mass status post left upper lobectomy with resection of the left lower lobe in addition to en bloc resection of the chest wall including left 3rd and 4th rib on 03/12/2024 under the care of Dr. Kerrin PD-L1 expression was 10%   He is currently on adjuvant chemotherapy with cisplatin  and docetaxel . His first dose was on 05/03/24. He is status post 1 cycle.  He is afebrile and well appearing today. His vitals are WNL.   We reviewed his labs. Specifically, we discussed the neutropenia. He did receive neulasta . Neutropenia due to chemotherapy with increased infection risk. Neutrophil count expected to rise post-injection. Schedule lab appointment early next week to monitor neutrophil count. We reviewed neutropenic precautions and advise avoiding large social gatherings and practicing good hygiene. Should he develop signs and symptoms of infection, he would need to be evaluated promptly.   Chemotherapy-induced diarrhea Diarrhea likely from chemotherapy. Imodium  effective. -We reviewed the dose of imodium  and that this is available OTC.  Follow  BRAT diet. - Ensure adequate hydration. He received IVF in ER today and this helped. He was advised to use liquid IV, water, Pedialyte, and or Gatorade. If he ever needs to get IVF, I advised for him to call.  - Contact clinic if diarrhea persists despite taking imodium  or developing new symptoms such as abdominal pain, fevers, or blood in the stool or dehydration suspected.  Oral candidiasis (  thrush) Oral thrush likely from chemotherapy, affecting taste. - Prescribe nystatin  mouth rinse to [facility] in [place].  Chemotherapy-induced dysgeusia Altered taste due to chemotherapy, may require dietary adjustments. - Recommend salt water rinses and lemon heads. - Encourage trying different foods, especially citrus fruits.  Protein and electrolyte deficiency Low protein and electrolytes likely from diet and diarrhea. - Increase dietary protein intake. - Encourage electrolyte-rich drinks.  Hiccups Intermittent hiccups possibly from surgery and chemotherapy. - Continue baclofen  as needed. - Provide breathing exercises and non-pharmacological strategies.   We will see him back in 2 weeks before undergoing cycle #2.   The patient was advised to call immediately if he has any concerning symptoms in the interval. The patient voices understanding of current disease status and treatment options and is in agreement with the current care plan. All questions were answered. The patient knows to call the clinic with any problems, questions or concerns. We can certainly see the patient much sooner if necessary      Orders Placed This Encounter  Procedures   CBC with Differential (Cancer Center Only)    Standing Status:   Standing    Number of Occurrences:   10    Expiration Date:   05/09/2025   CMP (Cancer Center only)    Standing Status:   Standing    Number of Occurrences:   10    Expiration Date:   05/09/2025   Magnesium     Standing Status:   Standing    Number of Occurrences:   10    Expiration  Date:   05/09/2025     The total time spent in the appointment was 20-29 minutes  Cailee Blanke L Davidlee Jeanbaptiste, PA-C 05/09/24

## 2024-05-05 ENCOUNTER — Inpatient Hospital Stay

## 2024-05-05 VITALS — BP 159/75 | HR 62 | Temp 97.7°F | Resp 16

## 2024-05-05 DIAGNOSIS — D649 Anemia, unspecified: Secondary | ICD-10-CM | POA: Diagnosis not present

## 2024-05-05 DIAGNOSIS — Z5189 Encounter for other specified aftercare: Secondary | ICD-10-CM | POA: Diagnosis not present

## 2024-05-05 DIAGNOSIS — I10 Essential (primary) hypertension: Secondary | ICD-10-CM | POA: Diagnosis not present

## 2024-05-05 DIAGNOSIS — C3412 Malignant neoplasm of upper lobe, left bronchus or lung: Secondary | ICD-10-CM

## 2024-05-05 DIAGNOSIS — Z5111 Encounter for antineoplastic chemotherapy: Secondary | ICD-10-CM | POA: Diagnosis not present

## 2024-05-05 MED ORDER — PEGFILGRASTIM-CBQV 6 MG/0.6ML ~~LOC~~ SOSY
6.0000 mg | PREFILLED_SYRINGE | Freq: Once | SUBCUTANEOUS | Status: AC
  Administered 2024-05-05: 6 mg via SUBCUTANEOUS
  Filled 2024-05-05: qty 0.6

## 2024-05-05 NOTE — Progress Notes (Signed)
 Pt arrived to infusion for Udenyca  injection after receiving his first cycle of Cisplatin  on 05/03/24. Pt is complaining of palpitations and significant hiccups that he states had me up all night. He is anxious and concerned about these symptoms. Vital signs stable and injection given. Pt stated please give me something to help me. I instructed patient to go to the emergency department to have further evaluation. Pt states that he will think about it and he prefers to go to [facility] which is near his house. Pt ambulated to lobby in stable condition.

## 2024-05-06 ENCOUNTER — Emergency Department (HOSPITAL_COMMUNITY)

## 2024-05-06 ENCOUNTER — Other Ambulatory Visit: Payer: Self-pay

## 2024-05-06 ENCOUNTER — Emergency Department (HOSPITAL_COMMUNITY): Admission: EM | Admit: 2024-05-06 | Discharge: 2024-05-06 | Disposition: A | Discharge status: Home / Self Care

## 2024-05-06 ENCOUNTER — Encounter (HOSPITAL_COMMUNITY): Payer: Self-pay | Admitting: Emergency Medicine

## 2024-05-06 DIAGNOSIS — Z85118 Personal history of other malignant neoplasm of bronchus and lung: Secondary | ICD-10-CM | POA: Diagnosis not present

## 2024-05-06 DIAGNOSIS — I10 Essential (primary) hypertension: Secondary | ICD-10-CM | POA: Diagnosis not present

## 2024-05-06 DIAGNOSIS — Z7982 Long term (current) use of aspirin: Secondary | ICD-10-CM | POA: Insufficient documentation

## 2024-05-06 DIAGNOSIS — J948 Other specified pleural conditions: Secondary | ICD-10-CM | POA: Diagnosis not present

## 2024-05-06 DIAGNOSIS — R066 Hiccough: Secondary | ICD-10-CM | POA: Insufficient documentation

## 2024-05-06 DIAGNOSIS — Z79899 Other long term (current) drug therapy: Secondary | ICD-10-CM | POA: Diagnosis not present

## 2024-05-06 DIAGNOSIS — J984 Other disorders of lung: Secondary | ICD-10-CM | POA: Diagnosis not present

## 2024-05-06 DIAGNOSIS — J929 Pleural plaque without asbestos: Secondary | ICD-10-CM | POA: Diagnosis not present

## 2024-05-06 LAB — CBC
HCT: 34.9 % — ABNORMAL LOW (ref 39.0–52.0)
Hemoglobin: 11.3 g/dL — ABNORMAL LOW (ref 13.0–17.0)
MCH: 29.9 pg (ref 26.0–34.0)
MCHC: 32.4 g/dL (ref 30.0–36.0)
MCV: 92.3 fL (ref 80.0–100.0)
Platelets: 205 K/uL (ref 150–400)
RBC: 3.78 MIL/uL — ABNORMAL LOW (ref 4.22–5.81)
RDW: 13.4 % (ref 11.5–15.5)
WBC: 32.4 K/uL — ABNORMAL HIGH (ref 4.0–10.5)
nRBC: 0 % (ref 0.0–0.2)

## 2024-05-06 LAB — BASIC METABOLIC PANEL WITH GFR
Anion gap: 8 (ref 5–15)
BUN: 27 mg/dL — ABNORMAL HIGH (ref 8–23)
CO2: 25 mmol/L (ref 22–32)
Calcium: 8 mg/dL — ABNORMAL LOW (ref 8.9–10.3)
Chloride: 99 mmol/L (ref 98–111)
Creatinine, Ser: 0.79 mg/dL (ref 0.61–1.24)
GFR, Estimated: >60 mL/min (ref >60)
Glucose, Bld: 211 mg/dL — ABNORMAL HIGH (ref 70–99)
Potassium: 3.9 mmol/L (ref 3.5–5.1)
Sodium: 132 mmol/L — ABNORMAL LOW (ref 135–145)

## 2024-05-06 LAB — ALBUMIN: Albumin: 3.1 g/dL — ABNORMAL LOW (ref 3.5–5.0)

## 2024-05-06 MED ORDER — BACLOFEN 10 MG PO TABS: 10.0000 mg | ORAL_TABLET | Freq: Three times a day (TID) | ORAL | 0 refills | Status: DC

## 2024-05-06 MED ORDER — BACLOFEN 10 MG PO TABS
5.0000 mg | ORAL_TABLET | Freq: Once | ORAL | Status: AC
  Administered 2024-05-06: 5 mg via ORAL
  Filled 2024-05-06: qty 1

## 2024-05-06 MED ORDER — CALCIUM GLUCONATE-NACL 1-0.675 GM/50ML-% IV SOLN
1.0000 g | Freq: Once | INTRAVENOUS | Status: AC
  Administered 2024-05-06: 1000 mg via INTRAVENOUS
  Filled 2024-05-06: qty 50

## 2024-05-06 NOTE — ED Triage Notes (Signed)
 Pt to the ED after he received his first chemo treatment on Thursday with complaints of hiccups.  Pt was informed by some of the nurses at [facility] the chemo drug he received promotes hiccups.

## 2024-05-06 NOTE — Discharge Instructions (Addendum)
 Please take the baclofen  as needed for hiccups.  Please follow up with your oncologist on Wednesday as scheduled.  Return to the emergency department or call your doctor for worsening symptoms.  Please take 2 tums 3 times daily until you follow up with your oncologist to have your labs rechecked.

## 2024-05-06 NOTE — ED Notes (Signed)
 Pt reports that his hiccups feel like dogs pattering around on the inside of his chest and only occur occasionally, in bursts. When asked to clarify, he says it feels like palpitations. No prior hx of same. Pt notes that he can feel like his heart is skipping beats from time to time. No SOB, no lightheadedness, no CP. Frequent PVCs noted on cardiac monitor.

## 2024-05-06 NOTE — ED Provider Notes (Signed)
 [place] EMERGENCY DEPARTMENT AT [facility] Provider Note   CSN: [id number] Arrival date & time: 05/06/24  1439     Patient presents with: Hiccups   Russell [name] is a 73 y.o. male.   73 year old male with past medical history of lung cancer as well as hypertension and hyperlipidemia presenting to the emergency department today with hiccups.  Patient states that he had his first chemotherapy last week.  States that he initially had these after his initial treatment.  He states that the following day that this seemed to improve.  He states this is come back last night he was unable to sleep much due to the hiccups.  He called his oncologist office and the nurse recommended he come to the ER for further evaluation.  He is on cisplatin  which is known to cause hiccups.  He states that he has been having some intermittent palpitations but denies any chest pain or shortness of breath with this.  He states this usually occurs when he is having the hiccups.        Prior to Admission medications   Medication Sig Start Date End Date Taking? Authorizing Provider  baclofen  (LIORESAL ) 10 MG tablet Take 1 tablet (10 mg total) by mouth 3 (three) times daily. 05/06/24  Yes Ula Prentice SAUNDERS, MD  amiodarone  (PACERONE ) 200 MG tablet Take 2 tablets (400 mg total) by mouth 2 (two) times daily. For 2 weeks then decrease the dose to 1 tablet (200mg ) once daily. 03/19/24   Roddenberry, Myron G, PA-C  amLODipine  (NORVASC ) 5 MG tablet TAKE 1 TABLET EVERY DAY FOR BLOOD PRESSURE 10/03/23   Zollie Lowers, MD  ascorbic acid (VITAMIN C) 500 MG tablet Take 500 mg by mouth daily. Takes every other day    [provider]  aspirin  EC 81 MG tablet Take 81 mg by mouth every other day.     [provider]  Cholecalciferol (VITAMIN D3) 125 MCG (5000 UT) CAPS Take 5,000 Units by mouth every other day.    [provider]  cyanocobalamin  (VITAMIN B12) 1000 MCG tablet Take 1,000 mcg by mouth  every other day.    [provider]  dexamethasone  (DECADRON ) 4 MG tablet Take 2 tabs (8 mg) twice daily starting the day before docetaxel , then daily x 3 days after cisplatin .Take with food. 04/12/24   Sherrod Sherrod, MD  olmesartan  (BENICAR ) 40 MG tablet Take 1 tablet (40 mg total) by mouth daily. For blood pressure 03/31/23   Zollie Lowers, MD  ondansetron  (ZOFRAN ) 8 MG tablet Take 1 tablet (8 mg total) by mouth every 8 (eight) hours as needed for nausea or vomiting. Begin on the third day after cisplatin  chemotherapy. 04/12/24   Sherrod Sherrod, MD  prochlorperazine  (COMPAZINE ) 10 MG tablet Take 1 tablet (10 mg total) by mouth every 6 (six) hours as needed for nausea or vomiting. 04/12/24   Sherrod Sherrod, MD  rosuvastatin  (CRESTOR ) 10 MG tablet Take 1 tablet (10 mg total) by mouth every other day. 02/01/24   Patwardhan, Newman PARAS, MD    Allergies: Patient has no known allergies.    Review of Systems  Cardiovascular:  Positive for palpitations.  All other systems reviewed and are negative.   Updated Vital Signs BP (!) 157/67   Pulse (!) 56   Temp 98.1 F (36.7 C) (Oral)   Resp 13   Ht 5' 8 (1.727 m)   Wt 30.2 kg   SpO2 97%   BMI 10.11 kg/m  Physical Exam Vitals and nursing note reviewed.   Gen: NAD Eyes: PERRL, EOMI HEENT: no oropharyngeal swelling Neck: trachea midline Resp: clear to auscultation bilaterally Card: RRR, no murmurs, rubs, or gallops Abd: nontender, nondistended Extremities: no calf tenderness, no edema Vascular: 2+ radial pulses bilaterally, 2+ DP pulses bilaterally Neuro: No focal deficits Skin: no rashes Psyc: acting appropriately   (all labs ordered are listed, but only abnormal results are displayed) Labs Reviewed  CBC - Abnormal; Notable for the following components:      Result Value   WBC 32.4 (*)    RBC 3.78 (*)    Hemoglobin 11.3 (*)    HCT 34.9 (*)    All other components within normal limits  BASIC METABOLIC PANEL WITH GFR -  Abnormal; Notable for the following components:   Sodium 132 (*)    Glucose, Bld 211 (*)    BUN 27 (*)    Calcium  8.0 (*)    All other components within normal limits  ALBUMIN  - Abnormal; Notable for the following components:   Albumin  3.1 (*)    All other components within normal limits    EKG: None  Radiology: DG Chest Portable 1 View Result Date: 05/06/2024 CLINICAL DATA:  Hiccups. Active chemotherapy. Left upper lobectomy last month. EXAM: PORTABLE CHEST 1 VIEW COMPARISON:  Chest radiograph 04/10/2024, additional prior radiographs reviewed. Most recent chest CT 02/29/2024 FINDINGS: Decreased left apical hydropneumothorax. Residual air-filled cavity is seen at the apex, previous pleural line is less well-defined. Pleural thickening about the lateral left hemithorax, similar. Stable left apical volume loss. Stable heart size and mediastinal contours. Aortic atherosclerosis. No focal right lung opacity. No pulmonary edema. IMPRESSION: 1. Decreased left apical hydropneumothorax. Residual air-filled cavity at the apex, previous pleural line is less well-defined. 2. Stable left apical postsurgical volume loss. Electronically Signed   By: Andrea Gasman M.D.   On: 05/06/2024 16:06     Procedures   Medications Ordered in the ED  calcium  gluconate 1 g/ 50 mL sodium chloride  IVPB (0 mg Intravenous Stopped 05/06/24 1804)  baclofen  (LIORESAL ) tablet 5 mg (5 mg Oral Given 05/06/24 1808)                                    Medical Decision Making 73 year old male with past medical history of lung cancer, hypertension, and hyperlipidemia presenting to the emergency department today with hiccups and intermittent palpitations.  I will further evaluate the patient here with basic labs to evaluate for anemia or electrolyte abnormalities.  Will obtain EKG and give patient cardiac monitor to eval for electrolyte abnormalities or anemia.  Will obtain a chest x-ray to eval for new structural causes.  If  his workup is reassuring we will discharge the patient on baclofen  for hiccups to try until he is able to follow-up with his oncologist.  The patient's labs are revealing for some mild hypocalcemia.  He also had a leukocytosis.  Chest x-ray shows improvement in his small residual hydropneumothorax after his surgery for his lung cancer.  I discussed the patient's case with Delon Hope, NP from oncology.  Recommends baclofen  and follow-up on Wednesday as scheduled.  The patient is given calcium  gluconate here.  Encouraged start oral calcium  supplements until he follows up on Wednesday.  He is hemodynamically stable and is discharged with return precautions after discussion with oncology.  Amount and/or Complexity of Data Reviewed Labs: ordered. Radiology: ordered.  Risk Prescription drug management.        Final diagnoses:  Hiccups  Hypocalcemia    ED Discharge Orders          Ordered    baclofen  (LIORESAL ) 10 MG tablet  3 times daily        05/06/24 1759               Ula Prentice SAUNDERS, MD 05/06/24 [phone]

## 2024-05-08 ENCOUNTER — Other Ambulatory Visit: Payer: Self-pay

## 2024-05-08 ENCOUNTER — Other Ambulatory Visit: Payer: Self-pay | Admitting: Physician Assistant

## 2024-05-08 DIAGNOSIS — C3412 Malignant neoplasm of upper lobe, left bronchus or lung: Secondary | ICD-10-CM

## 2024-05-09 ENCOUNTER — Encounter: Payer: Self-pay | Admitting: Internal Medicine

## 2024-05-09 ENCOUNTER — Other Ambulatory Visit: Payer: Self-pay

## 2024-05-09 ENCOUNTER — Inpatient Hospital Stay: Attending: Physician Assistant | Admitting: Physician Assistant

## 2024-05-09 ENCOUNTER — Encounter (HOSPITAL_COMMUNITY): Payer: Self-pay

## 2024-05-09 ENCOUNTER — Other Ambulatory Visit

## 2024-05-09 ENCOUNTER — Emergency Department (HOSPITAL_COMMUNITY)
Admission: EM | Admit: 2024-05-09 | Discharge: 2024-05-09 | Disposition: A | Discharge status: Home / Self Care | Attending: Emergency Medicine | Admitting: Emergency Medicine

## 2024-05-09 VITALS — BP 136/52 | HR 65 | Temp 97.8°F | Resp 13 | Wt 145.5 lb

## 2024-05-09 DIAGNOSIS — R066 Hiccough: Secondary | ICD-10-CM | POA: Diagnosis not present

## 2024-05-09 DIAGNOSIS — C3412 Malignant neoplasm of upper lobe, left bronchus or lung: Secondary | ICD-10-CM | POA: Diagnosis not present

## 2024-05-09 DIAGNOSIS — Z7982 Long term (current) use of aspirin: Secondary | ICD-10-CM | POA: Diagnosis not present

## 2024-05-09 DIAGNOSIS — R432 Parageusia: Secondary | ICD-10-CM | POA: Insufficient documentation

## 2024-05-09 DIAGNOSIS — K521 Toxic gastroenteritis and colitis: Secondary | ICD-10-CM | POA: Insufficient documentation

## 2024-05-09 DIAGNOSIS — Z5189 Encounter for other specified aftercare: Secondary | ICD-10-CM | POA: Insufficient documentation

## 2024-05-09 DIAGNOSIS — B370 Candidal stomatitis: Secondary | ICD-10-CM | POA: Insufficient documentation

## 2024-05-09 DIAGNOSIS — E878 Other disorders of electrolyte and fluid balance, not elsewhere classified: Secondary | ICD-10-CM | POA: Insufficient documentation

## 2024-05-09 DIAGNOSIS — R197 Diarrhea, unspecified: Secondary | ICD-10-CM | POA: Diagnosis not present

## 2024-05-09 DIAGNOSIS — D709 Neutropenia, unspecified: Secondary | ICD-10-CM | POA: Diagnosis not present

## 2024-05-09 DIAGNOSIS — Z79899 Other long term (current) drug therapy: Secondary | ICD-10-CM | POA: Insufficient documentation

## 2024-05-09 DIAGNOSIS — Z5111 Encounter for antineoplastic chemotherapy: Secondary | ICD-10-CM | POA: Diagnosis not present

## 2024-05-09 LAB — COMPREHENSIVE METABOLIC PANEL WITH GFR
ALT: 14 U/L (ref 0–44)
AST: 15 U/L (ref 15–41)
Albumin: 3.1 g/dL — ABNORMAL LOW (ref 3.5–5.0)
Alkaline Phosphatase: 62 U/L (ref 38–126)
Anion gap: 12 (ref 5–15)
BUN: 15 mg/dL (ref 8–23)
CO2: 23 mmol/L (ref 22–32)
Calcium: 8.1 mg/dL — ABNORMAL LOW (ref 8.9–10.3)
Chloride: 96 mmol/L — ABNORMAL LOW (ref 98–111)
Creatinine, Ser: 0.86 mg/dL (ref 0.61–1.24)
GFR, Estimated: >60 mL/min (ref >60)
Glucose, Bld: 148 mg/dL — ABNORMAL HIGH (ref 70–99)
Potassium: 3.8 mmol/L (ref 3.5–5.1)
Sodium: 131 mmol/L — ABNORMAL LOW (ref 135–145)
Total Bilirubin: 0.7 mg/dL (ref 0.0–1.2)
Total Protein: 5.8 g/dL — ABNORMAL LOW (ref 6.5–8.1)

## 2024-05-09 LAB — MAGNESIUM: Magnesium: 1.7 mg/dL (ref 1.7–2.4)

## 2024-05-09 MED ORDER — LOPERAMIDE HCL 2 MG PO CAPS
2.0000 mg | ORAL_CAPSULE | Freq: Once | ORAL | Status: AC
  Administered 2024-05-09: 2 mg via ORAL
  Filled 2024-05-09: qty 1

## 2024-05-09 MED ORDER — NYSTATIN 100000 UNIT/ML MT SUSP: 5.0000 mL | Freq: Four times a day (QID) | OROMUCOSAL | 0 refills | Status: DC

## 2024-05-09 MED ORDER — LOPERAMIDE HCL 2 MG PO CAPS: 2.0000 mg | ORAL_CAPSULE | Freq: Four times a day (QID) | ORAL | 0 refills | Status: DC | PRN

## 2024-05-09 MED ORDER — SODIUM CHLORIDE 0.9 % IV BOLUS
1000.0000 mL | Freq: Once | INTRAVENOUS | Status: AC
  Administered 2024-05-09: 1000 mL via INTRAVENOUS

## 2024-05-09 NOTE — ED Triage Notes (Signed)
 Pt c/o diarrhea for 24 hours; pt states approx. 12 very soft BM during this period. pt currently being treated for lung cancer. Pt denies N/V, headache. A&Ox4.

## 2024-05-09 NOTE — ED Provider Notes (Signed)
 [place] EMERGENCY DEPARTMENT AT [facility] Provider Note   CSN: [id number] Arrival date & time: 05/09/24  9250     Patient presents with: Diarrhea   Russell [name] is a 73 y.o. male.   HPI Patient with recent initiation of chemotherapy for lung cancer now presents with loose stool for 1 day.  No abdominal pain, no fever, no nausea, vomiting, and the patient was generally recovering well from chemotherapy 1 week ago.  Over the past 24 hours, he has had multiple episodes of loose stool, with associated fatigue, though again not abdominal pain.     Prior to Admission medications   Medication Sig Start Date End Date Taking? Authorizing Provider  loperamide  (IMODIUM ) 2 MG capsule Take 1 capsule (2 mg total) by mouth 4 (four) times daily as needed for diarrhea or loose stools. 05/09/24  Yes Garrick Charleston, MD  amiodarone  (PACERONE ) 200 MG tablet Take 2 tablets (400 mg total) by mouth 2 (two) times daily. For 2 weeks then decrease the dose to 1 tablet (200mg ) once daily. 03/19/24   Roddenberry, Myron G, PA-C  amLODipine  (NORVASC ) 5 MG tablet TAKE 1 TABLET EVERY DAY FOR BLOOD PRESSURE 10/03/23   Zollie Lowers, MD  ascorbic acid (VITAMIN C) 500 MG tablet Take 500 mg by mouth daily. Takes every other day    [provider]  aspirin  EC 81 MG tablet Take 81 mg by mouth every other day.     [provider]  baclofen  (LIORESAL ) 10 MG tablet Take 1 tablet (10 mg total) by mouth 3 (three) times daily. 05/06/24   Ula Prentice SAUNDERS, MD  Cholecalciferol (VITAMIN D3) 125 MCG (5000 UT) CAPS Take 5,000 Units by mouth every other day.    [provider]  cyanocobalamin  (VITAMIN B12) 1000 MCG tablet Take 1,000 mcg by mouth every other day.    [provider]  dexamethasone  (DECADRON ) 4 MG tablet Take 2 tabs (8 mg) twice daily starting the day before docetaxel , then daily x 3 days after cisplatin .Take with food. 04/12/24   Sherrod Sherrod, MD  olmesartan  (BENICAR ) 40 MG  tablet Take 1 tablet (40 mg total) by mouth daily. For blood pressure 03/31/23   Zollie Lowers, MD  ondansetron  (ZOFRAN ) 8 MG tablet Take 1 tablet (8 mg total) by mouth every 8 (eight) hours as needed for nausea or vomiting. Begin on the third day after cisplatin  chemotherapy. 04/12/24   Sherrod Sherrod, MD  prochlorperazine  (COMPAZINE ) 10 MG tablet Take 1 tablet (10 mg total) by mouth every 6 (six) hours as needed for nausea or vomiting. 04/12/24   Sherrod Sherrod, MD  rosuvastatin  (CRESTOR ) 10 MG tablet Take 1 tablet (10 mg total) by mouth every other day. 02/01/24   Patwardhan, Newman PARAS, MD    Allergies: Patient has no known allergies.    Review of Systems  Updated Vital Signs BP (!) 143/63   Pulse (!) 55   Temp 97.8 F (36.6 C) (Oral)   Resp 15   Ht 1.727 m (5' 8)   Wt 63.5 kg   SpO2 99%   BMI 21.29 kg/m   Physical Exam Vitals and nursing note reviewed.  Constitutional:      General: He is not in acute distress.    Appearance: He is well-developed.  HENT:     Head: Normocephalic and atraumatic.  Eyes:     Conjunctiva/sclera: Conjunctivae normal.  Cardiovascular:     Rate and Rhythm: Normal rate and regular rhythm.  Pulmonary:  Effort: Pulmonary effort is normal. No respiratory distress.     Breath sounds: No stridor.  Abdominal:     General: There is no distension.  Skin:    General: Skin is warm and dry.  Neurological:     Mental Status: He is alert and oriented to person, place, and time.     (all labs ordered are listed, but only abnormal results are displayed) Labs Reviewed  COMPREHENSIVE METABOLIC PANEL WITH GFR - Abnormal; Notable for the following components:      Result Value   Sodium 131 (*)    Chloride 96 (*)    Glucose, Bld 148 (*)    Calcium  8.1 (*)    Total Protein 5.8 (*)    Albumin  3.1 (*)    All other components within normal limits  CBC WITH DIFFERENTIAL/PLATELET - Abnormal; Notable for the following components:   WBC 1.0 (*)    RBC 4.14  (*)    Hemoglobin 12.6 (*)    HCT 38.1 (*)    Neutro Abs 0.3 (*)    Lymphs Abs 0.5 (*)    All other components within normal limits  MAGNESIUM     EKG: None  Radiology: No results found.   Procedures   Medications Ordered in the ED  sodium chloride  0.9 % bolus 1,000 mL (0 mLs Intravenous Stopped 05/09/24 0947)  loperamide  (IMODIUM ) capsule 2 mg (2 mg Oral Given 05/09/24 0946)                                    Medical Decision Making Adult male recent initiation of chemotherapy, lung cancer presents with loose stool.  Patient is awake and alert, no abdominal pain, no vomiting, fever, reassuring for lower suspicion for infection though this is a possibility.  Dehydration, electrolyte abnormalities, chemotherapy-induced loose stool all considerations.  Amount and/or Complexity of Data Reviewed External Data Reviewed: notes. Labs: ordered. Decision-making details documented in ED Course.  Risk Prescription drug management. Decision regarding hospitalization.  During IV placement patient had an episode of asymptomatic bradycardia, heart rate in the 30s transiently.  He was placed on cardiac monitoring, and on my repeat eval patient's heart rate was in the 50s, sinus, unremarkable. Pulse ox 100% room air normal 9:49 AM Labs reviewed, discussed, leukopenia appropriate for 1 week status post chemotherapy, electrolytes unremarkable.  Patient has received fluid resuscitation, will start Imodium , is appropriate for outpatient follow-up with his chemotherapy team given the absence of abdominal pain, fever, evidence for neutropenic fever, complications beyond loose stool from chemotherapy.  I connected with patient's oncology team for follow-up as well.     Final diagnoses:  Diarrhea, unspecified type    ED Discharge Orders          Ordered    loperamide  (IMODIUM ) 2 MG capsule  4 times daily PRN        05/09/24 0949               Garrick Charleston, MD 05/09/24 [phone]

## 2024-05-09 NOTE — Discharge Instructions (Signed)
 Please be sure to follow-up with your oncology team.  They are aware of your visit today, should arrange follow-up.  Return here for concerning changes in your condition.

## 2024-05-10 LAB — CBC WITH DIFFERENTIAL/PLATELET
Abs Immature Granulocytes: 0 K/uL (ref 0.00–0.07)
Basophils Absolute: 0 K/uL (ref 0.0–0.1)
Basophils Relative: 0 %
Eosinophils Absolute: 0.1 K/uL (ref 0.0–0.5)
Eosinophils Relative: 10 %
HCT: 38.1 % — ABNORMAL LOW (ref 39.0–52.0)
Hemoglobin: 12.6 g/dL — ABNORMAL LOW (ref 13.0–17.0)
Lymphocytes Relative: 47 %
Lymphs Abs: 0.5 K/uL — ABNORMAL LOW (ref 0.7–4.0)
MCH: 30.4 pg (ref 26.0–34.0)
MCHC: 33.1 g/dL (ref 30.0–36.0)
MCV: 92 fL (ref 80.0–100.0)
Metamyelocytes Relative: 1 %
Monocytes Absolute: 0.1 K/uL (ref 0.1–1.0)
Monocytes Relative: 10 %
Neutro Abs: 0.3 K/uL — CL (ref 1.7–7.7)
Neutrophils Relative %: 32 %
Platelets: 160 K/uL (ref 150–400)
RBC: 4.14 MIL/uL — ABNORMAL LOW (ref 4.22–5.81)
RDW: 13.2 % (ref 11.5–15.5)
Smear Review: NORMAL
WBC: 1 K/uL — CL (ref 4.0–10.5)
nRBC: 0 % (ref 0.0–0.2)

## 2024-05-14 ENCOUNTER — Telehealth: Payer: Self-pay | Admitting: Physician Assistant

## 2024-05-14 ENCOUNTER — Inpatient Hospital Stay

## 2024-05-14 DIAGNOSIS — Z5111 Encounter for antineoplastic chemotherapy: Secondary | ICD-10-CM | POA: Diagnosis not present

## 2024-05-14 DIAGNOSIS — C3412 Malignant neoplasm of upper lobe, left bronchus or lung: Secondary | ICD-10-CM

## 2024-05-14 DIAGNOSIS — K521 Toxic gastroenteritis and colitis: Secondary | ICD-10-CM | POA: Diagnosis not present

## 2024-05-14 LAB — CMP (CANCER CENTER ONLY)
ALT: 9 U/L (ref 0–44)
AST: 10 U/L — ABNORMAL LOW (ref 15–41)
Albumin: 3.4 g/dL — ABNORMAL LOW (ref 3.5–5.0)
Alkaline Phosphatase: 85 U/L (ref 38–126)
Anion gap: 4 — ABNORMAL LOW (ref 5–15)
BUN: 12 mg/dL (ref 8–23)
CO2: 32 mmol/L (ref 22–32)
Calcium: 8.4 mg/dL — ABNORMAL LOW (ref 8.9–10.3)
Chloride: 102 mmol/L (ref 98–111)
Creatinine: 0.93 mg/dL (ref 0.61–1.24)
GFR, Estimated: >60 mL/min (ref >60)
Glucose, Bld: 97 mg/dL (ref 70–99)
Potassium: 4.4 mmol/L (ref 3.5–5.1)
Sodium: 138 mmol/L (ref 135–145)
Total Bilirubin: 0.2 mg/dL (ref 0.0–1.2)
Total Protein: 6 g/dL — ABNORMAL LOW (ref 6.5–8.1)

## 2024-05-14 LAB — CBC WITH DIFFERENTIAL (CANCER CENTER ONLY)
Abs Immature Granulocytes: 3.16 K/uL — ABNORMAL HIGH (ref 0.00–0.07)
Basophils Absolute: 0.1 K/uL (ref 0.0–0.1)
Basophils Relative: 0 %
Eosinophils Absolute: 0 K/uL (ref 0.0–0.5)
Eosinophils Relative: 0 %
HCT: 36.3 % — ABNORMAL LOW (ref 39.0–52.0)
Hemoglobin: 11.9 g/dL — ABNORMAL LOW (ref 13.0–17.0)
Immature Granulocytes: 10 %
Lymphocytes Relative: 6 %
Lymphs Abs: 1.8 K/uL (ref 0.7–4.0)
MCH: 29.4 pg (ref 26.0–34.0)
MCHC: 32.8 g/dL (ref 30.0–36.0)
MCV: 89.6 fL (ref 80.0–100.0)
Monocytes Absolute: 1.5 K/uL — ABNORMAL HIGH (ref 0.1–1.0)
Monocytes Relative: 5 %
Neutro Abs: 25 K/uL — ABNORMAL HIGH (ref 1.7–7.7)
Neutrophils Relative %: 79 %
Platelet Count: 240 K/uL (ref 150–400)
RBC: 4.05 MIL/uL — ABNORMAL LOW (ref 4.22–5.81)
RDW: 13.3 % (ref 11.5–15.5)
Smear Review: NORMAL
WBC Count: 31.6 K/uL — ABNORMAL HIGH (ref 4.0–10.5)
nRBC: 0.1 % (ref 0.0–0.2)

## 2024-05-14 LAB — MAGNESIUM: Magnesium: 2 mg/dL (ref 1.7–2.4)

## 2024-05-14 NOTE — Telephone Encounter (Signed)
 I called the patient as I received a message he had some questions. He had some fatigue, decreased appetite, and nausea lasting over a week from his first cycle of chemotherapy. Starting 05/12/24, he started feeling a lot better. He was contemplating stopping treatment but he also recalls being told about some of the alternative regimens for patient's unable to take cisplatin /docetaxel . He is contemplating switching to a regimen with less toxicity. Per Dr. Sherrod, they did not do studies comparing cisplatin /docetaxel  to carbo/taxol to know the difference in effectiveness in the adjuvant setting. We reviewed. His options. He is going to think about it and call me back prior to his next infusion.

## 2024-05-16 ENCOUNTER — Telehealth: Admitting: Physician Assistant

## 2024-05-16 ENCOUNTER — Ambulatory Visit: Admitting: Family Medicine

## 2024-05-16 ENCOUNTER — Encounter: Payer: Self-pay | Admitting: Family Medicine

## 2024-05-16 VITALS — BP 151/73 | HR 50 | Temp 97.8°F | Ht 68.0 in | Wt 144.0 lb

## 2024-05-16 DIAGNOSIS — I10 Essential (primary) hypertension: Secondary | ICD-10-CM

## 2024-05-16 DIAGNOSIS — C3412 Malignant neoplasm of upper lobe, left bronchus or lung: Secondary | ICD-10-CM | POA: Diagnosis not present

## 2024-05-16 DIAGNOSIS — I700 Atherosclerosis of aorta: Secondary | ICD-10-CM

## 2024-05-16 DIAGNOSIS — Z902 Acquired absence of lung [part of]: Secondary | ICD-10-CM | POA: Diagnosis not present

## 2024-05-16 MED ORDER — OLMESARTAN MEDOXOMIL 40 MG PO TABS: 40.0000 mg | ORAL_TABLET | Freq: Every day | ORAL | 3 refills | Status: DC

## 2024-05-16 MED ORDER — DIPHENOXYLATE-ATROPINE 2.5-0.025 MG PO TABS
2.0000 | ORAL_TABLET | Freq: Four times a day (QID) | ORAL | 5 refills | Status: AC | PRN
Start: 2024-05-16 — End: ?

## 2024-05-16 NOTE — Progress Notes (Unsigned)
 Subjective:  Patient ID: Russell [name], male    DOB: 12-31-1950  Age: 73 y.o. MRN: [id number]  CC: Hypertension (1 month follow up)   HPI  Discussed the use of AI scribe software for clinical note transcription with the patient, who gave verbal consent to proceed.  History of Present Illness Russell [name] is a 73 year old male undergoing chemotherapy who presents for a blood pressure follow-up.  He is currently on amlodipine  for blood pressure management but has not started taking olmesartan . His home blood pressure readings have been in the high 140s to 150s. He is seeking advice on whether to resume olmesartan .  He experienced severe side effects from chemotherapy last week, including persistent hiccups and diarrhea, leading to two emergency room visits. He was prescribed baclofen  for hiccups, taking up to three doses per day. Diarrhea caused dehydration, treated with fluids and Imodium . He is preparing for another chemotherapy session next Thursday and wants medications ready to manage potential side effects.  He had a lobectomy in July and is currently recovering. His appetite and energy levels have improved since last week, and he is feeling more normal now.  He has a history of aortic atherosclerosis and has been referred to a cardiologist. He was initially referred to Dr. Kizzie for a timely consultation but prefers to see Dr. Cheyenne in the future.          05/16/2024   10:26 AM 05/02/2024    8:22 AM 04/12/2024    2:57 PM  Depression screen PHQ 2/9  Decreased Interest 1 0 0  Down, Depressed, Hopeless 1 0 0  PHQ - 2 Score 2 0 0  Altered sleeping 1 1 0  Tired, decreased energy 1 1 0  Change in appetite 1 0 0  Feeling bad or failure about yourself  0 0 0  Trouble concentrating 0 0 0  Moving slowly or fidgety/restless 0 0 0  Suicidal thoughts 0 0 0  PHQ-9 Score 5 2 0    History Russell [name] has a past medical history of Dyspnea, GERD (gastroesophageal reflux disease), History  of hiatal hernia (07/22/2006), adenomatous polyp of colon (03/24/2016), Hyperlipidemia, Hypertension, Lung nodule (01/04/2024), Pneumonia, Pneumothorax after biopsy (10/17/2023), Pulmonary nodule (04/27/2023), and 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   He has a past surgical history that includes Hernia repair; Fracture surgery (Left, age 40); Wisdom tooth extraction; Colonoscopy (09/07/2003); Upper gi endoscopy; Upper gastrointestinal endoscopy; cyst removal from gum; Bronchial needle aspiration biopsy (10/17/2023); Bronchial biopsy (10/17/2023); Bronchial brushings (10/17/2023); Bronchial washings (10/17/2023); Video bronchoscopy with endobronchial navigation (Left, 01/17/2024); Bronchial washings (01/17/2024); Bronchial needle aspiration biopsy (01/17/2024); Bronchial brushings (01/17/2024); Bronchial biopsy (01/17/2024); Fuducial placement (01/17/2024); Lobectomy, lung, robot-assisted, using vats (Left, 03/12/2024); Thoracotomy (Left, 03/12/2024); Chest wall reconstruction (Left, 03/12/2024); Intercostal nerve block (Left, 03/12/2024); and Lymph node biopsy (Left, 03/12/2024).   His family history includes Diabetes in his mother; Heart disease in his mother; Hyperlipidemia in his father.He reports that he quit smoking about 4 months ago. His smoking use included cigarettes. He has a 45 pack-year smoking history. He has never used smokeless tobacco. He reports current alcohol use of about 14.0 standard drinks of alcohol per week. He reports that he does not use drugs.    ROS Review of Systems  Objective:  BP (!) 151/73   Pulse (!) 50   Temp 97.8 F (36.6 C)   Ht 5' 8 (1.727 m)   Wt 144 lb (65.3 kg)   SpO2 98%  BMI 21.90 kg/m   BP Readings from Last 3 Encounters:  05/16/24 (!) 151/73  05/09/24 (!) 136/52  05/09/24 135/62    Wt Readings from Last 3 Encounters:  05/16/24 144 lb (65.3 kg)  05/09/24 145 lb 8 oz (66 kg)  05/09/24 140 lb (63.5 kg)     Physical Exam   Assessment & Plan:   Aortic atherosclerosis (HCC) -     Ambulatory referral to Cardiology  Primary hypertension -     Olmesartan  Medoxomil; Take 1 tablet (40 mg total) by mouth daily. For blood pressure  Dispense: 90 tablet; Refill: 3  Other orders -     Diphenoxylate -Atropine ; Take 2 tablets by mouth 4 (four) times daily as needed for diarrhea or loose stools.  Dispense: 30 tablet; Refill: 5    Assessment and Plan Assessment & Plan Chemotherapy for malignancy   He is undergoing chemotherapy with the next session scheduled for next Thursday. Recent side effects have been challenging, but symptoms are currently improving. Monitor for side effects and manage with prescribed medications.  Chemotherapy-induced side effects: hiccups and diarrhea   Severe hiccups and diarrhea occurred as side effects of chemotherapy, requiring two ER visits. Hiccups are managed with baclofen , and diarrhea was initially managed with Imodium . Prescribe Lomotil  as a backup for diarrhea management, to be sent to [facility] with refills. Continue baclofen  for hiccups management.  Primary hypertension   Blood pressure readings at home are in the high 140s to 150s. Current management with amlodipine  alone is insufficient. Given ongoing chemotherapy and other health issues, maintaining blood pressure below 130 is crucial to reduce the risk of stroke and heart attack. Resume olmesartan  to achieve better blood pressure control. Ensure olmesartan  prescription is sent to [facility] with refills.  Aortic atherosclerosis   Aortic atherosclerosis necessitates ongoing cardiovascular monitoring and management. Previous referral to Dr. Cheyenne for cardiology follow-up was not completed. Reinitiate referral to Dr. Cheyenne for cardiology follow-up.       Follow-up: Return in about 3 months (around 08/15/2024).  Butler Der, M.D.

## 2024-05-21 ENCOUNTER — Other Ambulatory Visit: Payer: Self-pay | Admitting: Thoracic Surgery (Cardiothoracic Vascular Surgery)

## 2024-05-21 ENCOUNTER — Encounter: Payer: Self-pay | Admitting: Physician Assistant

## 2024-05-21 DIAGNOSIS — R911 Solitary pulmonary nodule: Secondary | ICD-10-CM

## 2024-05-21 NOTE — Progress Notes (Unsigned)
 [facility] OFFICE PROGRESS NOTE  Zollie Lowers, MD [address] [place] [address]  DIAGNOSIS: Stage IIb (T3, N0, M0) non-small cell lung cancer, squamous cell carcinoma presented with large cavitary left upper lobe lung mass diagnosed in May 2025. PD-L1 expression was 10%   PRIOR THERAPY: Status post left upper lobectomy with resection of the left lower lobe in addition to en bloc resection of the chest wall including left 3rd and 4th rib on 03/12/2024 under the care of Dr. Kerrin   CURRENT THERAPY: Adjuvant systemic chemotherapy with cisplatin  75 Mg/M2 and docetaxel  75 Mg/M2 every 3 weeks. First dose 05/03/2024. Status post 1 cycle.   INTERVAL HISTORY: Russell [name] 73 y.o. male returns to the clinic for a follow up visit.  Patient was last seen by myself on 05/09/2024.  I also spoke to the patient on the phone on 05/14/2024.  The patient developed diarrhea nausea, and taste alterations with cycle #1 that lasted approximately a week.  After a week week and a half the patient started feeling back to normal.  The patient called the clinic in the interval to discuss alternative options.  He was going to make a decision about changing treatment to another regimen that similar with more tolerable side effects but knowing we do not have data specifically regarding the effectiveness of the slight modifications in the chemotherapy versus continuing with his current treatment with supportive care with cycle #2 as he now knows what needs to be done.  After some consideration the patient decided to ***.   Hiccups.   ***Taste changes. Also had thrush.   Overall he is feeling ***today.  He denies any fever, chills, or night sweats.  Appetite and taste?  He denies any unusual cough, chest pain, or hemoptysis.  She does have intermittent shortness of breath for which is from his prior surgery and he uses an inhaler if needed.  He denies any current nausea, vomiting, diarrhea, or  constipation.  He denies any headache or visual changes.  He is here today for evaluation and repeat blood work before considering undergoing cycle #2.  MEDICAL HISTORY: Past Medical History:  Diagnosis Date   Dyspnea    with exertion   GERD (gastroesophageal reflux disease)    History of hiatal hernia 07/22/2006   small - dx by endoscopy   Hx of adenomatous polyp of colon 03/24/2016   Hyperlipidemia    Hypertension    Lung nodule 01/04/2024   left upper lobe   Pneumonia    x 1 as teenager   Pneumothorax after biopsy 10/17/2023   Pulmonary nodule 04/27/2023   Status post lobectomy of lung 03/12/2024    ALLERGIES:  has no known allergies.  MEDICATIONS:  Current Outpatient Medications  Medication Sig Dispense Refill   amiodarone  (PACERONE ) 200 MG tablet Take 2 tablets (400 mg total) by mouth 2 (two) times daily. For 2 weeks then decrease the dose to 1 tablet (200mg ) once daily. 60 tablet 1   amLODipine  (NORVASC ) 5 MG tablet TAKE 1 TABLET EVERY DAY FOR BLOOD PRESSURE 90 tablet 3   ascorbic acid (VITAMIN C) 500 MG tablet Take 500 mg by mouth daily. Takes every other day     aspirin  EC 81 MG tablet Take 81 mg by mouth every other day.      baclofen  (LIORESAL ) 10 MG tablet Take 1 tablet (10 mg total) by mouth 3 (three) times daily. 30 each 0   Cholecalciferol (VITAMIN D3) 125 MCG (  5000 UT) CAPS Take 5,000 Units by mouth every other day.     cyanocobalamin  (VITAMIN B12) 1000 MCG tablet Take 1,000 mcg by mouth every other day.     dexamethasone  (DECADRON ) 4 MG tablet Take 2 tabs (8 mg) twice daily starting the day before docetaxel , then daily x 3 days after cisplatin .Take with food. 30 tablet 1   diphenoxylate -atropine  (LOMOTIL ) 2.5-0.025 MG tablet Take 2 tablets by mouth 4 (four) times daily as needed for diarrhea or loose stools. 30 tablet 5   loperamide  (IMODIUM ) 2 MG capsule Take 1 capsule (2 mg total) by mouth 4 (four) times daily as needed for diarrhea or loose stools. 12 capsule 0    nystatin  (MYCOSTATIN ) 100000 UNIT/ML suspension Take 5 mLs (500,000 Units total) by mouth 4 (four) times daily. 60 mL 0   olmesartan  (BENICAR ) 40 MG tablet Take 1 tablet (40 mg total) by mouth daily. For blood pressure 90 tablet 3   ondansetron  (ZOFRAN ) 8 MG tablet Take 1 tablet (8 mg total) by mouth every 8 (eight) hours as needed for nausea or vomiting. Begin on the third day after cisplatin  chemotherapy. 30 tablet 1   prochlorperazine  (COMPAZINE ) 10 MG tablet Take 1 tablet (10 mg total) by mouth every 6 (six) hours as needed for nausea or vomiting. 30 tablet 1   rosuvastatin  (CRESTOR ) 10 MG tablet Take 1 tablet (10 mg total) by mouth every other day. 90 tablet 1   No current facility-administered medications for this visit.    SURGICAL HISTORY:  Past Surgical History:  Procedure Laterality Date   BRONCHIAL BIOPSY  10/17/2023   Procedure: BRONCHIAL BIOPSIES;  Surgeon: Shelah Lamar RAMAN, MD;  Location: [facility] ENDOSCOPY;  Service: Pulmonary;;   BRONCHIAL BIOPSY  01/17/2024   Procedure: BRONCHOSCOPY, WITH BIOPSY;  Surgeon: Shelah Lamar RAMAN, MD;  Location: [facility] ENDOSCOPY;  Service: Pulmonary;;   BRONCHIAL BRUSHINGS  10/17/2023   Procedure: BRONCHIAL BRUSHINGS;  Surgeon: Shelah Lamar RAMAN, MD;  Location: [facility] ENDOSCOPY;  Service: Pulmonary;;   BRONCHIAL BRUSHINGS  01/17/2024   Procedure: BRONCHOSCOPY, WITH BRUSH BIOPSY;  Surgeon: Shelah Lamar RAMAN, MD;  Location: [facility] ENDOSCOPY;  Service: Pulmonary;;   BRONCHIAL NEEDLE ASPIRATION BIOPSY  10/17/2023   Procedure: BRONCHIAL NEEDLE ASPIRATION BIOPSIES;  Surgeon: Shelah Lamar RAMAN, MD;  Location: [facility] ENDOSCOPY;  Service: Pulmonary;;   BRONCHIAL NEEDLE ASPIRATION BIOPSY  01/17/2024   Procedure: BRONCHOSCOPY, WITH NEEDLE ASPIRATION BIOPSY;  Surgeon: Shelah Lamar RAMAN, MD;  Location: [facility] ENDOSCOPY;  Service: Pulmonary;;   BRONCHIAL WASHINGS  10/17/2023   Procedure: BRONCHIAL WASHINGS;  Surgeon: Shelah Lamar RAMAN, MD;  Location: [facility] ENDOSCOPY;  Service: Pulmonary;;   BRONCHIAL  WASHINGS  01/17/2024   Procedure: IRRIGATION, BRONCHUS;  Surgeon: Shelah Lamar RAMAN, MD;  Location: [facility] ENDOSCOPY;  Service: Pulmonary;;   CHEST WALL RECONSTRUCTION Left 03/12/2024   Procedure: RECONSTRUCTION, MAJOR, CHEST WALL;  Surgeon: Kerrin Elspeth BROCKS, MD;  Location: [facility] OR;  Service: Thoracic;  Laterality: Left;   COLONOSCOPY  09/07/2003   gessner - hx polyp; colonoscopy x several - last one on 06/04/21   cyst removal from gum     FRACTURE SURGERY Left age 77   Left Knee   FUDUCIAL PLACEMENT  01/17/2024   Procedure: INSERTION, FIDUCIAL MARKER, GOLD;  Surgeon: Shelah Lamar RAMAN, MD;  Location: [facility] ENDOSCOPY;  Service: Pulmonary;;   HERNIA REPAIR     INTERCOSTAL NERVE BLOCK Left 03/12/2024   Procedure: BLOCK, NERVE, INTERCOSTAL;  Surgeon: Kerrin Elspeth BROCKS, MD;  Location: [facility] OR;  Service: Thoracic;  Laterality: Left;   LOBECTOMY, LUNG, ROBOT-ASSISTED, USING VATS Left 03/12/2024   Procedure: LOBECTOMY, LUNG, ROBOT-ASSISTED, USING VATS;  Surgeon: Kerrin Elspeth BROCKS, MD;  Location: [facility] OR;  Service: Thoracic;  Laterality: Left;  LEFT ROBOTIC LEFT UPPER LOBECTOMY, POSSIBLE THORACOTOMY, POSSIBLE CHEST WALL RESECTION   LYMPH NODE BIOPSY Left 03/12/2024   Procedure: LYMPH NODE BIOPSY;  Surgeon: Kerrin Elspeth BROCKS, MD;  Location: [facility] OR;  Service: Thoracic;  Laterality: Left;   THORACOTOMY Left 03/12/2024   Procedure: THORACOTOMY, MAJOR;  Surgeon: Kerrin Elspeth BROCKS, MD;  Location: [facility] OR;  Service: Thoracic;  Laterality: Left;   UPPER GASTROINTESTINAL ENDOSCOPY     UPPER GI ENDOSCOPY     Normal   VIDEO BRONCHOSCOPY WITH ENDOBRONCHIAL NAVIGATION Left 01/17/2024   Procedure: VIDEO BRONCHOSCOPY WITH ENDOBRONCHIAL NAVIGATION;  Surgeon: Shelah Lamar RAMAN, MD;  Location: [facility] ENDOSCOPY;  Service: Pulmonary;  Laterality: Left;   WISDOM TOOTH EXTRACTION      REVIEW OF SYSTEMS:   Review of Systems  Constitutional: Negative for appetite change, chills, fatigue, fever and unexpected weight change.  HENT:    Negative for mouth sores, nosebleeds, sore throat and trouble swallowing.   Eyes: Negative for eye problems and icterus.  Respiratory: Negative for cough, hemoptysis, shortness of breath and wheezing.   Cardiovascular: Negative for chest pain and leg swelling.  Gastrointestinal: Negative for abdominal pain, constipation, diarrhea, nausea and vomiting.  Genitourinary: Negative for bladder incontinence, difficulty urinating, dysuria, frequency and hematuria.   Musculoskeletal: Negative for back pain, gait problem, neck pain and neck stiffness.  Skin: Negative for itching and rash.  Neurological: Negative for dizziness, extremity weakness, gait problem, headaches, light-headedness and seizures.  Hematological: Negative for adenopathy. Does not bruise/bleed easily.  Psychiatric/Behavioral: Negative for confusion, depression and sleep disturbance. The patient is not nervous/anxious.     PHYSICAL EXAMINATION:  There were no vitals taken for this visit.  ECOG PERFORMANCE STATUS: {CHL ONC ECOG [id number]  Physical Exam  Constitutional: Oriented to person, place, and time and well-developed, well-nourished, and in no distress. No distress.  HENT:  Head: Normocephalic and atraumatic.  Mouth/Throat: Oropharynx is clear and moist. No oropharyngeal exudate.  Eyes: Conjunctivae are normal. Right eye exhibits no discharge. Left eye exhibits no discharge. No scleral icterus.  Neck: Normal range of motion. Neck supple.  Cardiovascular: Normal rate, regular rhythm, normal heart sounds and intact distal pulses.   Pulmonary/Chest: Effort normal and breath sounds normal. No respiratory distress. No wheezes. No rales.  Abdominal: Soft. Bowel sounds are normal. Exhibits no distension and no mass. There is no tenderness.  Musculoskeletal: Normal range of motion. Exhibits no edema.  Lymphadenopathy:    No cervical adenopathy.  Neurological: Alert and oriented to person, place, and time. Exhibits normal  muscle tone. Gait normal. Coordination normal.  Skin: Skin is warm and dry. No rash noted. Not diaphoretic. No erythema. No pallor.  Psychiatric: Mood, memory and judgment normal.  Vitals reviewed.  LABORATORY DATA: Lab Results  Component Value Date   WBC 31.6 (H) 05/14/2024   HGB 11.9 (L) 05/14/2024   HCT 36.3 (L) 05/14/2024   MCV 89.6 05/14/2024   PLT 240 05/14/2024      Chemistry      Component Value Date/Time   NA 138 05/14/2024 0847   NA 139 04/11/2024 0917   K 4.4 05/14/2024 0847   CL 102 05/14/2024 0847   CO2 32 05/14/2024 0847   BUN 12 05/14/2024 0847   BUN 16 04/11/2024 0917   CREATININE 0.93 05/14/2024  9152      Component Value Date/Time   CALCIUM  8.4 (L) 05/14/2024 0847   ALKPHOS 85 05/14/2024 0847   AST 10 (L) 05/14/2024 0847   ALT 9 05/14/2024 0847   BILITOT 0.2 05/14/2024 0847       RADIOGRAPHIC STUDIES:  DG Chest Portable 1 View Result Date: 05/06/2024 CLINICAL DATA:  Hiccups. Active chemotherapy. Left upper lobectomy last month. EXAM: PORTABLE CHEST 1 VIEW COMPARISON:  Chest radiograph 04/10/2024, additional prior radiographs reviewed. Most recent chest CT 02/29/2024 FINDINGS: Decreased left apical hydropneumothorax. Residual air-filled cavity is seen at the apex, previous pleural line is less well-defined. Pleural thickening about the lateral left hemithorax, similar. Stable left apical volume loss. Stable heart size and mediastinal contours. Aortic atherosclerosis. No focal right lung opacity. No pulmonary edema. IMPRESSION: 1. Decreased left apical hydropneumothorax. Residual air-filled cavity at the apex, previous pleural line is less well-defined. 2. Stable left apical postsurgical volume loss. Electronically Signed   By: Andrea Gasman M.D.   On: 05/06/2024 16:06     ASSESSMENT/PLAN:  This is a very pleasant 73 year old Caucasian male with stage IIb (T3, N0, M0) non-small cell lung cancer, squamous cell carcinoma presented with large cavitary left  upper lobe lung mass status post left upper lobectomy with resection of the left lower lobe in addition to en bloc resection of the chest wall including left 3rd and 4th rib on 03/12/2024 under the care of Dr. Kerrin PD-L1 expression was 10%   He is currently on adjuvant chemotherapy with cisplatin  and docetaxel . His first dose was on 05/03/24. He is status post 1 cycle.   We discussed if this treatment is too challenging for him that we can make some modifications to chemotherapy with carboplatin and Taxol but we do not have data regarding how that may alter the benefit.  After discussion the patient opted to ***.  We discussed supportive care moving forward.  I will tentatively arrange for IV fluid on ***.  We also reviewed Imodium  instructions.  Lomotil  as a backup?  We discussed BRAT diet.  He was advised to hydrate well with Pedialyte, Gatorade, and water.  We discussed salt water rinses and Biotene for the total taste alterations.  We discussed eating small frequent meals.   We also discussed extending the Decadron  for another 1 to 2 days which may help his nausea and vomiting and appetite and energy levels.  **Hiccups ***   The patient was advised to call immediately if he has any concerning symptoms in the interval. The patient voices understanding of current disease status and treatment options and is in agreement with the current care plan. All questions were answered. The patient knows to call the clinic with any problems, questions or concerns. We can certainly see the patient much sooner if necessary         No orders of the defined types were placed in this encounter.    I spent {CHL ONC TIME VISIT - DTPQU:[id number]} counseling the patient face to face. The total time spent in the appointment was {CHL ONC TIME VISIT - DTPQU:[id number]}.  Jeremia Groot L Revia Nghiem, PA-C 05/21/24

## 2024-05-22 ENCOUNTER — Encounter: Payer: Self-pay | Admitting: Thoracic Surgery (Cardiothoracic Vascular Surgery)

## 2024-05-22 ENCOUNTER — Ambulatory Visit (HOSPITAL_COMMUNITY)
Admission: RE | Admit: 2024-05-22 | Discharge: 2024-05-22 | Disposition: A | Discharge status: Home / Self Care | Source: Ambulatory Visit | Attending: Cardiovascular Disease | Admitting: Cardiovascular Disease

## 2024-05-22 ENCOUNTER — Ambulatory Visit
Payer: Self-pay | Attending: Thoracic Surgery (Cardiothoracic Vascular Surgery) | Admitting: Thoracic Surgery (Cardiothoracic Vascular Surgery)

## 2024-05-22 VITALS — BP 146/75 | HR 58 | Resp 18 | Ht 68.0 in | Wt 145.0 lb

## 2024-05-22 DIAGNOSIS — J948 Other specified pleural conditions: Secondary | ICD-10-CM | POA: Diagnosis not present

## 2024-05-22 DIAGNOSIS — J939 Pneumothorax, unspecified: Secondary | ICD-10-CM | POA: Diagnosis not present

## 2024-05-22 DIAGNOSIS — Z09 Encounter for follow-up examination after completed treatment for conditions other than malignant neoplasm: Secondary | ICD-10-CM

## 2024-05-22 DIAGNOSIS — J984 Other disorders of lung: Secondary | ICD-10-CM | POA: Diagnosis not present

## 2024-05-22 DIAGNOSIS — R911 Solitary pulmonary nodule: Secondary | ICD-10-CM | POA: Insufficient documentation

## 2024-05-22 DIAGNOSIS — C3490 Malignant neoplasm of unspecified part of unspecified bronchus or lung: Secondary | ICD-10-CM | POA: Diagnosis not present

## 2024-05-22 MED FILL — Sodium Chloride IV Soln 0.9%: INTRAVENOUS | Qty: 1000 | Status: AC

## 2024-05-22 NOTE — Progress Notes (Signed)
 [address], [place] [address]             [phone]     HPI: Russell [name] returns for scheduled follow-up visit after lobectomy with en bloc chest wall resection.  Russell [name] is a 73 year old man with a history of tobacco abuse who had a robotic assisted left upper lobectomy with en bloc resection of chest wall and reconstruction on 03/12/2024.  Postoperative course complicated by an air leak and went home with a chest tube.  That resolved and his tube was removed in the office.  I saw him in the office on 04/10/2024.  He was doing well at that time.  In the interim since that visit he started chemotherapy.  He had severe nausea and diarrhea after the first cycle of chemotherapy and ended up in the ED a couple of times.  Scheduled for his next cycle this Thursday.  He feels well.  He is not taking any pain medication.  Occasionally will feel a popping sensation with certain movements.  Past Medical History:  Diagnosis Date   Dyspnea    with exertion   GERD (gastroesophageal reflux disease)    History of hiatal hernia 07/22/2006   small - dx by endoscopy   Hx of adenomatous polyp of colon 03/24/2016   Hyperlipidemia    Hypertension    Lung nodule 01/04/2024   left upper lobe   Pneumonia    x 1 as teenager   Pneumothorax after biopsy 10/17/2023   Pulmonary nodule 04/27/2023   Status post lobectomy of lung 03/12/2024    Current Outpatient Medications  Medication Sig Dispense Refill   amLODipine  (NORVASC ) 5 MG tablet TAKE 1 TABLET EVERY DAY FOR BLOOD PRESSURE 90 tablet 3   ascorbic acid (VITAMIN C) 500 MG tablet Take 500 mg by mouth daily. Takes every other day     aspirin  EC 81 MG tablet Take 81 mg by mouth every other day.      baclofen  (LIORESAL ) 10 MG tablet Take 1 tablet (10 mg total) by mouth 3 (three) times daily. 30 each 0   Cholecalciferol (VITAMIN D3) 125 MCG (5000 UT) CAPS Take 5,000 Units by mouth every other day.     cyanocobalamin  (VITAMIN  B12) 1000 MCG tablet Take 1,000 mcg by mouth every other day.     dexamethasone  (DECADRON ) 4 MG tablet Take 2 tabs (8 mg) twice daily starting the day before docetaxel , then daily x 3 days after cisplatin .Take with food. 30 tablet 1   diphenoxylate -atropine  (LOMOTIL ) 2.5-0.025 MG tablet Take 2 tablets by mouth 4 (four) times daily as needed for diarrhea or loose stools. 30 tablet 5   loperamide  (IMODIUM ) 2 MG capsule Take 1 capsule (2 mg total) by mouth 4 (four) times daily as needed for diarrhea or loose stools. 12 capsule 0   nystatin  (MYCOSTATIN ) 100000 UNIT/ML suspension Take 5 mLs (500,000 Units total) by mouth 4 (four) times daily. 60 mL 0   olmesartan  (BENICAR ) 40 MG tablet Take 1 tablet (40 mg total) by mouth daily. For blood pressure 90 tablet 3   ondansetron  (ZOFRAN ) 8 MG tablet Take 1 tablet (8 mg total) by mouth every 8 (eight) hours as needed for nausea or vomiting. Begin on the third day after cisplatin  chemotherapy. 30 tablet 1   prochlorperazine  (COMPAZINE ) 10 MG tablet Take 1 tablet (10 mg total) by mouth every 6 (six) hours as needed for nausea or vomiting. 30 tablet 1  rosuvastatin  (CRESTOR ) 10 MG tablet Take 1 tablet (10 mg total) by mouth every other day. 90 tablet 1   No current facility-administered medications for this visit.    Physical Exam BP (!) 146/75   Pulse (!) 58   Resp 18   Ht 5' 8 (1.727 m)   Wt 145 lb (65.8 kg)   SpO2 96% Comment: RA  BMI 22.05 kg/m  Well-appearing 73 year old man in no acute distress Alert and oriented x 3 with no focal deficits Lungs clear with equal breath sounds bilaterally Cardiac bradycardic and regular Incisions well-healed  Diagnostic Tests: I personally reviewed his chest x-ray images.  Evolution of postoperative changes post lobectomy with chest wall resection.  No concerning findings  Impression: Russell [name] is a 73 year old man with a history of tobacco abuse who had a robotic assisted left upper lobectomy with en bloc  resection of chest wall and reconstruction on 03/12/2024.  Status post left upper lobectomy with en bloc chest wall resection-doing extremely well.  Not requiring any pain medication.  No respiratory issues.  Stage IIb squamous cell carcinoma-status post lobectomy now undergoing adjuvant chemotherapy.  Second cycle scheduled for later this week.  Plan: Follow-up with Dr. Sherrod Return in 3 months with PA lateral chest x-ray  Russell JAYSON Millers, MD [facility] [phone]

## 2024-05-24 ENCOUNTER — Inpatient Hospital Stay

## 2024-05-24 ENCOUNTER — Encounter: Payer: Self-pay | Admitting: Internal Medicine

## 2024-05-24 ENCOUNTER — Inpatient Hospital Stay (HOSPITAL_BASED_OUTPATIENT_CLINIC_OR_DEPARTMENT_OTHER): Admitting: Physician Assistant

## 2024-05-24 VITALS — BP 122/62 | HR 61 | Temp 98.1°F | Resp 13 | Wt 143.7 lb

## 2024-05-24 DIAGNOSIS — D709 Neutropenia, unspecified: Secondary | ICD-10-CM | POA: Diagnosis not present

## 2024-05-24 DIAGNOSIS — Z5111 Encounter for antineoplastic chemotherapy: Secondary | ICD-10-CM | POA: Diagnosis not present

## 2024-05-24 DIAGNOSIS — C3412 Malignant neoplasm of upper lobe, left bronchus or lung: Secondary | ICD-10-CM | POA: Diagnosis not present

## 2024-05-24 LAB — CMP (CANCER CENTER ONLY)
ALT: 9 U/L (ref 0–44)
AST: 10 U/L — ABNORMAL LOW (ref 15–41)
Albumin: 3.6 g/dL (ref 3.5–5.0)
Alkaline Phosphatase: 77 U/L (ref 38–126)
Anion gap: 7 (ref 5–15)
BUN: 19 mg/dL (ref 8–23)
CO2: 25 mmol/L (ref 22–32)
Calcium: 8.9 mg/dL (ref 8.9–10.3)
Chloride: 104 mmol/L (ref 98–111)
Creatinine: 0.72 mg/dL (ref 0.61–1.24)
GFR, Estimated: >60 mL/min (ref >60)
Glucose, Bld: 149 mg/dL — ABNORMAL HIGH (ref 70–99)
Potassium: 4.5 mmol/L (ref 3.5–5.1)
Sodium: 136 mmol/L (ref 135–145)
Total Bilirubin: 0.2 mg/dL (ref 0.0–1.2)
Total Protein: 6.8 g/dL (ref 6.5–8.1)

## 2024-05-24 LAB — CBC WITH DIFFERENTIAL (CANCER CENTER ONLY)
Abs Immature Granulocytes: 0.35 K/uL — ABNORMAL HIGH (ref 0.00–0.07)
Basophils Absolute: 0.1 K/uL (ref 0.0–0.1)
Basophils Relative: 0 %
Eosinophils Absolute: 0 K/uL (ref 0.0–0.5)
Eosinophils Relative: 0 %
HCT: 34 % — ABNORMAL LOW (ref 39.0–52.0)
Hemoglobin: 11.3 g/dL — ABNORMAL LOW (ref 13.0–17.0)
Immature Granulocytes: 1 %
Lymphocytes Relative: 4 %
Lymphs Abs: 1 K/uL (ref 0.7–4.0)
MCH: 29.1 pg (ref 26.0–34.0)
MCHC: 33.2 g/dL (ref 30.0–36.0)
MCV: 87.6 fL (ref 80.0–100.0)
Monocytes Absolute: 0.4 K/uL (ref 0.1–1.0)
Monocytes Relative: 2 %
Neutro Abs: 22.9 K/uL — ABNORMAL HIGH (ref 1.7–7.7)
Neutrophils Relative %: 93 %
Platelet Count: 460 K/uL — ABNORMAL HIGH (ref 150–400)
RBC: 3.88 MIL/uL — ABNORMAL LOW (ref 4.22–5.81)
RDW: 13.2 % (ref 11.5–15.5)
WBC Count: 24.8 K/uL — ABNORMAL HIGH (ref 4.0–10.5)
nRBC: 0 % (ref 0.0–0.2)

## 2024-05-24 LAB — MAGNESIUM: Magnesium: 2.1 mg/dL (ref 1.7–2.4)

## 2024-05-24 MED ORDER — APREPITANT 130 MG/18ML IV EMUL
130.0000 mg | Freq: Once | INTRAVENOUS | Status: AC
  Administered 2024-05-24: 130 mg via INTRAVENOUS
  Filled 2024-05-24: qty 18

## 2024-05-24 MED ORDER — SODIUM CHLORIDE 0.9 % IV SOLN: INTRAVENOUS | Status: DC

## 2024-05-24 MED ORDER — SODIUM CHLORIDE 0.9 % IV SOLN
75.0000 mg/m2 | Freq: Once | INTRAVENOUS | Status: AC
  Administered 2024-05-24: 133 mg via INTRAVENOUS
  Filled 2024-05-24: qty 130.76

## 2024-05-24 MED ORDER — MAGNESIUM SULFATE 2 GM/50ML IV SOLN
2.0000 g | Freq: Once | INTRAVENOUS | Status: AC
  Administered 2024-05-24: 2 g via INTRAVENOUS
  Filled 2024-05-24: qty 50

## 2024-05-24 MED ORDER — DEXAMETHASONE SODIUM PHOSPHATE 10 MG/ML IJ SOLN
10.0000 mg | Freq: Once | INTRAMUSCULAR | Status: AC
  Administered 2024-05-24: 10 mg via INTRAVENOUS
  Filled 2024-05-24: qty 1

## 2024-05-24 MED ORDER — GABAPENTIN 100 MG PO CAPS: 100.0000 mg | ORAL_CAPSULE | Freq: Three times a day (TID) | ORAL | 0 refills | Status: DC

## 2024-05-24 MED ORDER — POTASSIUM CHLORIDE IN NACL 20-0.9 MEQ/L-% IV SOLN
Freq: Once | INTRAVENOUS | Status: AC
  Filled 2024-05-24: qty 1000

## 2024-05-24 MED ORDER — PALONOSETRON HCL INJECTION 0.25 MG/5ML
0.2500 mg | Freq: Once | INTRAVENOUS | Status: AC
  Administered 2024-05-24: 0.25 mg via INTRAVENOUS
  Filled 2024-05-24: qty 5

## 2024-05-24 MED ORDER — SODIUM CHLORIDE 0.9 % IV SOLN
75.0000 mg/m2 | Freq: Once | INTRAVENOUS | Status: AC
  Administered 2024-05-24: 133 mg via INTRAVENOUS
  Filled 2024-05-24: qty 13.3

## 2024-05-24 NOTE — Patient Instructions (Signed)
 [facility] - A DEPT OF [facility]  Discharge Instructions: Thank you for choosing [facility] to provide your oncology and hematology care.   If you have a lab appointment with the Cancer Center, please go directly to the Cancer Center and check in at the registration area.   Wear comfortable clothing and clothing appropriate for easy access to any Portacath or PICC line.   We strive to give you quality time with your provider. You may need to reschedule your appointment if you arrive late (15 or more minutes).  Arriving late affects you and other patients whose appointments are after yours.  Also, if you miss three or more appointments without notifying the office, you may be dismissed from the clinic at the provider's discretion.      For prescription refill requests, have your pharmacy contact our office and allow 72 hours for refills to be completed.    Today you received the following chemotherapy and/or immunotherapy agents cisplatin , taxotere        To help prevent nausea and vomiting after your treatment, we encourage you to take your nausea medication as directed.  BELOW ARE SYMPTOMS THAT SHOULD BE REPORTED IMMEDIATELY: *FEVER GREATER THAN 100.4 F (38 C) OR HIGHER *CHILLS OR SWEATING *NAUSEA AND VOMITING THAT IS NOT CONTROLLED WITH YOUR NAUSEA MEDICATION *UNUSUAL SHORTNESS OF BREATH *UNUSUAL BRUISING OR BLEEDING *URINARY PROBLEMS (pain or burning when urinating, or frequent urination) *BOWEL PROBLEMS (unusual diarrhea, constipation, pain near the anus) TENDERNESS IN MOUTH AND THROAT WITH OR WITHOUT PRESENCE OF ULCERS (sore throat, sores in mouth, or a toothache) UNUSUAL RASH, SWELLING OR PAIN  UNUSUAL VAGINAL DISCHARGE OR ITCHING   Items with * indicate a potential emergency and should be followed up as soon as possible or go to the Emergency Department if any problems should occur.  Please show the CHEMOTHERAPY ALERT CARD or  IMMUNOTHERAPY ALERT CARD at check-in to the Emergency Department and triage nurse.  Should you have questions after your visit or need to cancel or reschedule your appointment, please contact [facility] - A DEPT OF [facility]  Dept: [phone]  and follow the prompts.  Office hours are 8:00 a.m. to 4:30 p.m. Monday - Friday. Please note that voicemails left after 4:00 p.m. may not be returned until the following business day.  We are closed weekends and major holidays. You have access to a nurse at all times for urgent questions. Please call the main number to the clinic Dept: [phone] and follow the prompts.   For any non-urgent questions, you may also contact your provider using MyChart. We now offer e-Visits for anyone 73 and older to request care online for non-urgent symptoms. For details visit [email or web address].   Also download the MyChart app! Go to the app store, search MyChart, open the app, select [facility], and log in with your MyChart username and password.

## 2024-05-26 ENCOUNTER — Inpatient Hospital Stay

## 2024-05-26 VITALS — BP 142/67 | HR 55 | Temp 97.9°F | Resp 17

## 2024-05-26 DIAGNOSIS — C3412 Malignant neoplasm of upper lobe, left bronchus or lung: Secondary | ICD-10-CM

## 2024-05-26 DIAGNOSIS — Z5111 Encounter for antineoplastic chemotherapy: Secondary | ICD-10-CM | POA: Diagnosis not present

## 2024-05-26 MED ORDER — PEGFILGRASTIM-CBQV 6 MG/0.6ML ~~LOC~~ SOSY
6.0000 mg | PREFILLED_SYRINGE | Freq: Once | SUBCUTANEOUS | Status: AC
  Administered 2024-05-26: 6 mg via SUBCUTANEOUS
  Filled 2024-05-26: qty 0.6

## 2024-05-28 ENCOUNTER — Inpatient Hospital Stay

## 2024-05-31 ENCOUNTER — Inpatient Hospital Stay

## 2024-05-31 DIAGNOSIS — C3412 Malignant neoplasm of upper lobe, left bronchus or lung: Secondary | ICD-10-CM

## 2024-05-31 DIAGNOSIS — Z5111 Encounter for antineoplastic chemotherapy: Secondary | ICD-10-CM | POA: Diagnosis not present

## 2024-05-31 LAB — CBC WITH DIFFERENTIAL (CANCER CENTER ONLY)
Abs Immature Granulocytes: 0.04 K/uL (ref 0.00–0.07)
Basophils Absolute: 0.1 K/uL (ref 0.0–0.1)
Basophils Relative: 1 %
Eosinophils Absolute: 0.2 K/uL (ref 0.0–0.5)
Eosinophils Relative: 4 %
HCT: 33.1 % — ABNORMAL LOW (ref 39.0–52.0)
Hemoglobin: 11.1 g/dL — ABNORMAL LOW (ref 13.0–17.0)
Immature Granulocytes: 1 %
Lymphocytes Relative: 21 %
Lymphs Abs: 0.8 K/uL (ref 0.7–4.0)
MCH: 29.4 pg (ref 26.0–34.0)
MCHC: 33.5 g/dL (ref 30.0–36.0)
MCV: 87.6 fL (ref 80.0–100.0)
Monocytes Absolute: 0.9 K/uL (ref 0.1–1.0)
Monocytes Relative: 23 %
Neutro Abs: 1.9 K/uL (ref 1.7–7.7)
Neutrophils Relative %: 50 %
Platelet Count: 236 K/uL (ref 150–400)
RBC: 3.78 MIL/uL — ABNORMAL LOW (ref 4.22–5.81)
RDW: 13.7 % (ref 11.5–15.5)
Smear Review: NORMAL
WBC Count: 3.9 K/uL — ABNORMAL LOW (ref 4.0–10.5)
nRBC: 0 % (ref 0.0–0.2)

## 2024-05-31 LAB — CMP (CANCER CENTER ONLY)
ALT: 9 U/L (ref 0–44)
AST: 10 U/L — ABNORMAL LOW (ref 15–41)
Albumin: 3.5 g/dL (ref 3.5–5.0)
Alkaline Phosphatase: 80 U/L (ref 38–126)
Anion gap: 3 — ABNORMAL LOW (ref 5–15)
BUN: 16 mg/dL (ref 8–23)
CO2: 30 mmol/L (ref 22–32)
Calcium: 8.6 mg/dL — ABNORMAL LOW (ref 8.9–10.3)
Chloride: 102 mmol/L (ref 98–111)
Creatinine: 1.05 mg/dL (ref 0.61–1.24)
GFR, Estimated: >60 mL/min (ref >60)
Glucose, Bld: 83 mg/dL (ref 70–99)
Potassium: 4.7 mmol/L (ref 3.5–5.1)
Sodium: 135 mmol/L (ref 135–145)
Total Bilirubin: 0.4 mg/dL (ref 0.0–1.2)
Total Protein: 6 g/dL — ABNORMAL LOW (ref 6.5–8.1)

## 2024-05-31 LAB — MAGNESIUM: Magnesium: 1.8 mg/dL (ref 1.7–2.4)

## 2024-06-04 ENCOUNTER — Inpatient Hospital Stay

## 2024-06-06 ENCOUNTER — Encounter: Payer: Self-pay | Admitting: Internal Medicine

## 2024-06-06 ENCOUNTER — Inpatient Hospital Stay: Attending: Internal Medicine

## 2024-06-06 DIAGNOSIS — Z5111 Encounter for antineoplastic chemotherapy: Secondary | ICD-10-CM | POA: Insufficient documentation

## 2024-06-06 DIAGNOSIS — Z5189 Encounter for other specified aftercare: Secondary | ICD-10-CM | POA: Insufficient documentation

## 2024-06-06 DIAGNOSIS — C3412 Malignant neoplasm of upper lobe, left bronchus or lung: Secondary | ICD-10-CM | POA: Insufficient documentation

## 2024-06-06 LAB — CBC WITH DIFFERENTIAL (CANCER CENTER ONLY)
Abs Immature Granulocytes: 0.96 K/uL — ABNORMAL HIGH (ref 0.00–0.07)
Basophils Absolute: 0.2 K/uL — ABNORMAL HIGH (ref 0.0–0.1)
Basophils Relative: 1 %
Eosinophils Absolute: 0 K/uL (ref 0.0–0.5)
Eosinophils Relative: 0 %
HCT: 33.3 % — ABNORMAL LOW (ref 39.0–52.0)
Hemoglobin: 10.9 g/dL — ABNORMAL LOW (ref 13.0–17.0)
Immature Granulocytes: 4 %
Lymphocytes Relative: 8 %
Lymphs Abs: 2 K/uL (ref 0.7–4.0)
MCH: 29.1 pg (ref 26.0–34.0)
MCHC: 32.7 g/dL (ref 30.0–36.0)
MCV: 88.8 fL (ref 80.0–100.0)
Monocytes Absolute: 0.9 K/uL (ref 0.1–1.0)
Monocytes Relative: 3 %
Neutro Abs: 21.4 K/uL — ABNORMAL HIGH (ref 1.7–7.7)
Neutrophils Relative %: 84 %
Platelet Count: 265 K/uL (ref 150–400)
RBC: 3.75 MIL/uL — ABNORMAL LOW (ref 4.22–5.81)
RDW: 14.1 % (ref 11.5–15.5)
WBC Count: 25.4 K/uL — ABNORMAL HIGH (ref 4.0–10.5)
nRBC: 0 % (ref 0.0–0.2)

## 2024-06-06 LAB — CMP (CANCER CENTER ONLY)
ALT: 9 U/L (ref 0–44)
AST: 9 U/L — ABNORMAL LOW (ref 15–41)
Albumin: 3.4 g/dL — ABNORMAL LOW (ref 3.5–5.0)
Alkaline Phosphatase: 114 U/L (ref 38–126)
Anion gap: 3 — ABNORMAL LOW (ref 5–15)
BUN: 10 mg/dL (ref 8–23)
CO2: 30 mmol/L (ref 22–32)
Calcium: 8.5 mg/dL — ABNORMAL LOW (ref 8.9–10.3)
Chloride: 105 mmol/L (ref 98–111)
Creatinine: 0.99 mg/dL (ref 0.61–1.24)
GFR, Estimated: >60 mL/min (ref >60)
Glucose, Bld: 84 mg/dL (ref 70–99)
Potassium: 4.8 mmol/L (ref 3.5–5.1)
Sodium: 138 mmol/L (ref 135–145)
Total Bilirubin: 0.2 mg/dL (ref 0.0–1.2)
Total Protein: 5.9 g/dL — ABNORMAL LOW (ref 6.5–8.1)

## 2024-06-06 LAB — MAGNESIUM: Magnesium: 1.9 mg/dL (ref 1.7–2.4)

## 2024-06-12 ENCOUNTER — Inpatient Hospital Stay: Admitting: Internal Medicine

## 2024-06-12 ENCOUNTER — Inpatient Hospital Stay

## 2024-06-12 DIAGNOSIS — Z5189 Encounter for other specified aftercare: Secondary | ICD-10-CM | POA: Diagnosis not present

## 2024-06-12 DIAGNOSIS — Z5111 Encounter for antineoplastic chemotherapy: Secondary | ICD-10-CM | POA: Diagnosis not present

## 2024-06-12 DIAGNOSIS — C3412 Malignant neoplasm of upper lobe, left bronchus or lung: Secondary | ICD-10-CM

## 2024-06-12 LAB — CBC WITH DIFFERENTIAL (CANCER CENTER ONLY)
Abs Immature Granulocytes: 0.31 K/uL — ABNORMAL HIGH (ref 0.00–0.07)
Basophils Absolute: 0.2 K/uL — ABNORMAL HIGH (ref 0.0–0.1)
Basophils Relative: 1 %
Eosinophils Absolute: 0 K/uL (ref 0.0–0.5)
Eosinophils Relative: 0 %
HCT: 32.9 % — ABNORMAL LOW (ref 39.0–52.0)
Hemoglobin: 10.7 g/dL — ABNORMAL LOW (ref 13.0–17.0)
Immature Granulocytes: 2 %
Lymphocytes Relative: 7 %
Lymphs Abs: 1.4 K/uL (ref 0.7–4.0)
MCH: 29.3 pg (ref 26.0–34.0)
MCHC: 32.5 g/dL (ref 30.0–36.0)
MCV: 90.1 fL (ref 80.0–100.0)
Monocytes Absolute: 1.2 K/uL — ABNORMAL HIGH (ref 0.1–1.0)
Monocytes Relative: 6 %
Neutro Abs: 17.9 K/uL — ABNORMAL HIGH (ref 1.7–7.7)
Neutrophils Relative %: 84 %
Platelet Count: 260 K/uL (ref 150–400)
RBC: 3.65 MIL/uL — ABNORMAL LOW (ref 4.22–5.81)
RDW: 14.6 % (ref 11.5–15.5)
WBC Count: 21.1 K/uL — ABNORMAL HIGH (ref 4.0–10.5)
nRBC: 0 % (ref 0.0–0.2)

## 2024-06-12 LAB — CMP (CANCER CENTER ONLY)
ALT: 10 U/L (ref 0–44)
AST: 11 U/L — ABNORMAL LOW (ref 15–41)
Albumin: 3.6 g/dL (ref 3.5–5.0)
Alkaline Phosphatase: 88 U/L (ref 38–126)
Anion gap: 5 (ref 5–15)
BUN: 19 mg/dL (ref 8–23)
CO2: 28 mmol/L (ref 22–32)
Calcium: 8.8 mg/dL — ABNORMAL LOW (ref 8.9–10.3)
Chloride: 106 mmol/L (ref 98–111)
Creatinine: 0.84 mg/dL (ref 0.61–1.24)
GFR, Estimated: >60 mL/min (ref >60)
Glucose, Bld: 94 mg/dL (ref 70–99)
Potassium: 4.4 mmol/L (ref 3.5–5.1)
Sodium: 139 mmol/L (ref 135–145)
Total Bilirubin: 0.3 mg/dL (ref 0.0–1.2)
Total Protein: 6.6 g/dL (ref 6.5–8.1)

## 2024-06-12 LAB — MAGNESIUM: Magnesium: 2.1 mg/dL (ref 1.7–2.4)

## 2024-06-12 MED ORDER — DEXAMETHASONE 4 MG PO TABS: ORAL_TABLET | ORAL | 0 refills | Status: DC

## 2024-06-12 NOTE — Progress Notes (Signed)
 [facility] [phone]   [phone]  OFFICE PROGRESS NOTE  Zollie Lowers, MD [address] [place] [address]  DIAGNOSIS: stage IIb (T3, N0, M0) non-small cell lung cancer, squamous cell carcinoma presented with large cavitary left upper lobe lung mass diagnosed in May 2025. PD-L1 expression was 10%  PRIOR THERAPY: Status post left upper lobectomy with resection of the left lower lobe in addition to en bloc resection of the chest wall including left 3rd and 4th rib on 03/12/2024 under the care of Dr. Kerrin  CURRENT THERAPY: Adjuvant systemic chemotherapy with cisplatin  75 Mg/M2 and docetaxel  75 Mg/M2 every 3 weeks.  First dose 05/03/2024.  Status post 2 cycles.  INTERVAL HISTORY: Russell [name] 73 y.o. male returns to the clinic today for follow-up visit.Discussed the use of AI scribe software for clinical note transcription with the patient, who gave verbal consent to proceed.  History of Present Illness Russell [name] is a 73 year old male with stage IIB non-small cell lung cancer who presents for evaluation before starting cycle number three of chemotherapy.  He was diagnosed with stage IIB non-small cell lung cancer, squamous cell carcinoma, in May 2025. In July 2025, he underwent a left upper lobectomy with resection of the left lower lobe and chest wall, including the left third and fourth ribs.  He is currently undergoing adjuvant systemic chemotherapy with cisplatin  and docetaxel  every three weeks and has completed two cycles. The first cycle was challenging and unpleasant, with significant side effects including unexpected hiccups and diarrhea. These symptoms were better managed during the second cycle with pre-prepared medications, and he experienced no nausea during the second cycle.    MEDICAL HISTORY: Past Medical History:  Diagnosis Date   Dyspnea    with exertion   GERD (gastroesophageal reflux disease)    History of hiatal  hernia 07/22/2006   small - dx by endoscopy   Hx of adenomatous polyp of colon 03/24/2016   Hyperlipidemia    Hypertension    Lung nodule 01/04/2024   left upper lobe   Pneumonia    x 1 as teenager   Pneumothorax after biopsy 10/17/2023   Pulmonary nodule 04/27/2023   Status post lobectomy of lung 03/12/2024    ALLERGIES:  has no known allergies.  MEDICATIONS:  Current Outpatient Medications  Medication Sig Dispense Refill   amLODipine  (NORVASC ) 5 MG tablet TAKE 1 TABLET EVERY DAY FOR BLOOD PRESSURE 90 tablet 3   ascorbic acid (VITAMIN C) 500 MG tablet Take 500 mg by mouth daily. Takes every other day     aspirin  EC 81 MG tablet Take 81 mg by mouth every other day.      baclofen  (LIORESAL ) 10 MG tablet Take 1 tablet (10 mg total) by mouth 3 (three) times daily. 30 each 0   Cholecalciferol (VITAMIN D3) 125 MCG (5000 UT) CAPS Take 5,000 Units by mouth every other day.     cyanocobalamin  (VITAMIN B12) 1000 MCG tablet Take 1,000 mcg by mouth every other day.     dexamethasone  (DECADRON ) 4 MG tablet Take 2 tabs (8 mg) twice daily starting the day before docetaxel , then daily x 3 days after cisplatin .Take with food. 30 tablet 1   diphenoxylate -atropine  (LOMOTIL ) 2.5-0.025 MG tablet Take 2 tablets by mouth 4 (four) times daily as needed for diarrhea or loose stools. 30 tablet 5   gabapentin  (NEURONTIN ) 100 MG capsule Take 1 capsule (100 mg total) by mouth 3 (  three) times daily. 60 capsule 0   loperamide  (IMODIUM ) 2 MG capsule Take 1 capsule (2 mg total) by mouth 4 (four) times daily as needed for diarrhea or loose stools. 12 capsule 0   nystatin  (MYCOSTATIN ) 100000 UNIT/ML suspension Take 5 mLs (500,000 Units total) by mouth 4 (four) times daily. 60 mL 0   olmesartan  (BENICAR ) 40 MG tablet Take 1 tablet (40 mg total) by mouth daily. For blood pressure 90 tablet 3   ondansetron  (ZOFRAN ) 8 MG tablet Take 1 tablet (8 mg total) by mouth every 8 (eight) hours as needed for nausea or vomiting.  Begin on the third day after cisplatin  chemotherapy. 30 tablet 1   prochlorperazine  (COMPAZINE ) 10 MG tablet Take 1 tablet (10 mg total) by mouth every 6 (six) hours as needed for nausea or vomiting. 30 tablet 1   rosuvastatin  (CRESTOR ) 10 MG tablet Take 1 tablet (10 mg total) by mouth every other day. 90 tablet 1   No current facility-administered medications for this visit.    SURGICAL HISTORY:  Past Surgical History:  Procedure Laterality Date   BRONCHIAL BIOPSY  10/17/2023   Procedure: BRONCHIAL BIOPSIES;  Surgeon: Shelah Lamar RAMAN, MD;  Location: [facility] ENDOSCOPY;  Service: Pulmonary;;   BRONCHIAL BIOPSY  01/17/2024   Procedure: BRONCHOSCOPY, WITH BIOPSY;  Surgeon: Shelah Lamar RAMAN, MD;  Location: [facility] ENDOSCOPY;  Service: Pulmonary;;   BRONCHIAL BRUSHINGS  10/17/2023   Procedure: BRONCHIAL BRUSHINGS;  Surgeon: Shelah Lamar RAMAN, MD;  Location: [facility] ENDOSCOPY;  Service: Pulmonary;;   BRONCHIAL BRUSHINGS  01/17/2024   Procedure: BRONCHOSCOPY, WITH BRUSH BIOPSY;  Surgeon: Shelah Lamar RAMAN, MD;  Location: [facility] ENDOSCOPY;  Service: Pulmonary;;   BRONCHIAL NEEDLE ASPIRATION BIOPSY  10/17/2023   Procedure: BRONCHIAL NEEDLE ASPIRATION BIOPSIES;  Surgeon: Shelah Lamar RAMAN, MD;  Location: [facility] ENDOSCOPY;  Service: Pulmonary;;   BRONCHIAL NEEDLE ASPIRATION BIOPSY  01/17/2024   Procedure: BRONCHOSCOPY, WITH NEEDLE ASPIRATION BIOPSY;  Surgeon: Shelah Lamar RAMAN, MD;  Location: [facility] ENDOSCOPY;  Service: Pulmonary;;   BRONCHIAL WASHINGS  10/17/2023   Procedure: BRONCHIAL WASHINGS;  Surgeon: Shelah Lamar RAMAN, MD;  Location: [facility] ENDOSCOPY;  Service: Pulmonary;;   BRONCHIAL WASHINGS  01/17/2024   Procedure: IRRIGATION, BRONCHUS;  Surgeon: Shelah Lamar RAMAN, MD;  Location: [facility] ENDOSCOPY;  Service: Pulmonary;;   CHEST WALL RECONSTRUCTION Left 03/12/2024   Procedure: RECONSTRUCTION, MAJOR, CHEST WALL;  Surgeon: Kerrin Elspeth BROCKS, MD;  Location: [facility] OR;  Service: Thoracic;  Laterality: Left;   COLONOSCOPY  09/07/2003   gessner - hx  polyp; colonoscopy x several - last one on 06/04/21   cyst removal from gum     FRACTURE SURGERY Left age 60   Left Knee   FUDUCIAL PLACEMENT  01/17/2024   Procedure: INSERTION, FIDUCIAL MARKER, GOLD;  Surgeon: Shelah Lamar RAMAN, MD;  Location: [facility] ENDOSCOPY;  Service: Pulmonary;;   HERNIA REPAIR     INTERCOSTAL NERVE BLOCK Left 03/12/2024   Procedure: BLOCK, NERVE, INTERCOSTAL;  Surgeon: Kerrin Elspeth BROCKS, MD;  Location: [facility] OR;  Service: Thoracic;  Laterality: Left;   LOBECTOMY, LUNG, ROBOT-ASSISTED, USING VATS Left 03/12/2024   Procedure: LOBECTOMY, LUNG, ROBOT-ASSISTED, USING VATS;  Surgeon: Kerrin Elspeth BROCKS, MD;  Location: [facility] OR;  Service: Thoracic;  Laterality: Left;  LEFT ROBOTIC LEFT UPPER LOBECTOMY, POSSIBLE THORACOTOMY, POSSIBLE CHEST WALL RESECTION   LYMPH NODE BIOPSY Left 03/12/2024   Procedure: LYMPH NODE BIOPSY;  Surgeon: Kerrin Elspeth BROCKS, MD;  Location: [facility] OR;  Service: Thoracic;  Laterality: Left;   THORACOTOMY Left 03/12/2024  Procedure: THORACOTOMY, MAJOR;  Surgeon: Kerrin Elspeth BROCKS, MD;  Location: [facility] OR;  Service: Thoracic;  Laterality: Left;   UPPER GASTROINTESTINAL ENDOSCOPY     UPPER GI ENDOSCOPY     Normal   VIDEO BRONCHOSCOPY WITH ENDOBRONCHIAL NAVIGATION Left 01/17/2024   Procedure: VIDEO BRONCHOSCOPY WITH ENDOBRONCHIAL NAVIGATION;  Surgeon: Shelah Lamar RAMAN, MD;  Location: [facility] ENDOSCOPY;  Service: Pulmonary;  Laterality: Left;   WISDOM TOOTH EXTRACTION      REVIEW OF SYSTEMS:  Constitutional: positive for fatigue Eyes: negative Ears, nose, mouth, throat, and face: negative Respiratory: negative Cardiovascular: negative Gastrointestinal: negative Genitourinary:negative Integument/breast: negative Hematologic/lymphatic: negative Musculoskeletal:negative Neurological: negative Behavioral/Psych: negative Endocrine: negative Allergic/Immunologic: negative   PHYSICAL EXAMINATION: General appearance: alert, cooperative, fatigued, and no distress Head:  Normocephalic, without obvious abnormality, atraumatic Neck: no adenopathy, no JVD, supple, symmetrical, trachea midline, and thyroid  not enlarged, symmetric, no tenderness/mass/nodules Lymph nodes: Cervical, supraclavicular, and axillary nodes normal. Resp: clear to auscultation bilaterally Back: symmetric, no curvature. ROM normal. No CVA tenderness. Cardio: regular rate and rhythm, S1, S2 normal, no murmur, click, rub or gallop GI: soft, non-tender; bowel sounds normal; no masses,  no organomegaly Extremities: extremities normal, atraumatic, no cyanosis or edema Neurologic: Alert and oriented X 3, normal strength and tone. Normal symmetric reflexes. Normal coordination and gait  ECOG PERFORMANCE STATUS: 1 - Symptomatic but completely ambulatory  Blood pressure 122/80, pulse 65, temperature (!) 96.6 F (35.9 C), resp. rate 17, height 5' 8 (1.727 m), weight 145 lb (65.8 kg), SpO2 95%.  LABORATORY DATA: Lab Results  Component Value Date   WBC 21.1 (H) 06/12/2024   HGB 10.7 (L) 06/12/2024   HCT 32.9 (L) 06/12/2024   MCV 90.1 06/12/2024   PLT 260 06/12/2024      Chemistry      Component Value Date/Time   NA 138 06/06/2024 0848   NA 139 04/11/2024 0917   K 4.8 06/06/2024 0848   CL 105 06/06/2024 0848   CO2 30 06/06/2024 0848   BUN 10 06/06/2024 0848   BUN 16 04/11/2024 0917   CREATININE 0.99 06/06/2024 0848      Component Value Date/Time   CALCIUM  8.5 (L) 06/06/2024 0848   ALKPHOS 114 06/06/2024 0848   AST 9 (L) 06/06/2024 0848   ALT 9 06/06/2024 0848   BILITOT 0.2 06/06/2024 0848       RADIOGRAPHIC STUDIES: DG Chest 2 View Result Date: 05/22/2024 CLINICAL DATA:  Lung cancer EXAM: CHEST - 2 VIEW COMPARISON:  Chest radiograph dated 05/06/2024 FINDINGS: Normal lung volumes. Persistent left apical opacity and volume loss with slightly increased rounded lucencies, which may reflect increase in cavitary component. Decreased size of loculated trace left apical pneumothorax.  Decreased left pleural effusion. Left heart border is obscured. No acute osseous abnormality. IMPRESSION: 1. Decreased trace left hydropneumothorax. 2. Persistent left apical opacity and volume loss with slightly increased rounded lucencies, which may reflect increase in cavitary component. Electronically Signed   By: Limin  Xu M.D.   On: 05/22/2024 09:14    ASSESSMENT AND PLAN: This is a very pleasant 73 years old white male with stage IIb (T3, N0, M0) non-small cell lung cancer, squamous cell carcinoma presented with large cavitary left upper lobe lung mass status post left upper lobectomy with resection of the left lower lobe in addition to en bloc resection of the chest wall including left 3rd and 4th rib on 03/12/2024 under the care of Dr. Kerrin PD-L1 expression was 10% He is currently undergoing adjuvant systemic chemotherapy  with cisplatin  75 mg/M2 and docetaxel  75 mg/M2 with Neulasta  support status post 2 cycles.  He tolerated second cycle of his treatment much better than the first 1. Assessment and Plan Assessment & Plan Stage IIB non-small cell lung cancer Diagnosed in May 2025, status post left upper lobectomy and resection of the left lower lobe, with chest wall resection including the left third and fourth ribs in July 2025. Currently undergoing adjuvant systemic chemotherapy with cisplatin  and docetaxel  every three weeks, post two cycles. The second cycle was better tolerated than the first, with fewer side effects. Discussion about potential immunotherapy post-chemotherapy based on PD-L1 expression, which is 10%. Immunotherapy is approved for PD-L1 expression of 1% and higher, but the benefit is greater with higher expression levels. Decision on immunotherapy will be made after completion of chemotherapy and reassessment with a scan. - Administer third cycle of cisplatin  and docetaxel  chemotherapy. - Reassess with a scan after completion of four chemotherapy cycles. - Consider  immunotherapy based on scan results and PD-L1 expression. - [facility] [place] [facility].  Chemotherapy-induced side effects Includes hiccups, diarrhea, and nausea. The second cycle was better managed with preemptive medication, resulting in fewer side effects. Hiccups were unexpected but did not recur in the second cycle. Diarrhea and nausea were controlled with medication. - Continue preemptive medication regimen to manage side effects. - Take dexamethasone  as prescribed: two tablets in the morning and two in the evening on the day before, the day of, and the day after chemotherapy. - Provide additional medication if needed for future cycles. The patient was advised to call immediately if he has any other concerning symptoms in the interval. The patient voices understanding of current disease status and treatment options and is in agreement with the current care plan.  All questions were answered. The patient knows to call the clinic with any problems, questions or concerns. We can certainly see the patient much sooner if necessary.  The total time spent in the appointment was 30 minutes including review of chart and various tests results, discussions about plan of care and coordination of care plan .   Disclaimer: This note was dictated with voice recognition software. Similar sounding words can inadvertently be transcribed and may not be corrected upon review.

## 2024-06-13 ENCOUNTER — Inpatient Hospital Stay

## 2024-06-13 VITALS — BP 129/58 | HR 62 | Temp 98.5°F | Resp 18 | Wt 147.8 lb

## 2024-06-13 DIAGNOSIS — C3412 Malignant neoplasm of upper lobe, left bronchus or lung: Secondary | ICD-10-CM

## 2024-06-13 DIAGNOSIS — Z5111 Encounter for antineoplastic chemotherapy: Secondary | ICD-10-CM | POA: Diagnosis not present

## 2024-06-13 MED ORDER — SODIUM CHLORIDE 0.9 % IV SOLN
75.0000 mg/m2 | Freq: Once | INTRAVENOUS | Status: AC
  Administered 2024-06-13: 133 mg via INTRAVENOUS
  Filled 2024-06-13: qty 13.3

## 2024-06-13 MED ORDER — SODIUM CHLORIDE 0.9 % IV SOLN: INTRAVENOUS | Status: DC

## 2024-06-13 MED ORDER — POTASSIUM CHLORIDE IN NACL 20-0.9 MEQ/L-% IV SOLN
Freq: Once | INTRAVENOUS | Status: AC
  Filled 2024-06-13: qty 1000

## 2024-06-13 MED ORDER — SODIUM CHLORIDE 0.9 % IV SOLN
75.0000 mg/m2 | Freq: Once | INTRAVENOUS | Status: AC
  Administered 2024-06-13: 133 mg via INTRAVENOUS
  Filled 2024-06-13: qty 133

## 2024-06-13 MED ORDER — MAGNESIUM SULFATE 2 GM/50ML IV SOLN
2.0000 g | Freq: Once | INTRAVENOUS | Status: AC
  Administered 2024-06-13: 2 g via INTRAVENOUS
  Filled 2024-06-13: qty 50

## 2024-06-13 MED ORDER — PALONOSETRON HCL INJECTION 0.25 MG/5ML
0.2500 mg | Freq: Once | INTRAVENOUS | Status: AC
  Administered 2024-06-13: 0.25 mg via INTRAVENOUS
  Filled 2024-06-13: qty 5

## 2024-06-13 MED ORDER — DEXAMETHASONE SODIUM PHOSPHATE 10 MG/ML IJ SOLN
10.0000 mg | Freq: Once | INTRAMUSCULAR | Status: AC
  Administered 2024-06-13: 10 mg via INTRAVENOUS
  Filled 2024-06-13: qty 1

## 2024-06-13 MED ORDER — APREPITANT 130 MG/18ML IV EMUL
130.0000 mg | Freq: Once | INTRAVENOUS | Status: AC
  Administered 2024-06-13: 130 mg via INTRAVENOUS
  Filled 2024-06-13: qty 18

## 2024-06-13 NOTE — Progress Notes (Signed)
 Per VO from Dr. Sherrod ok to run post cisplatin  fluids concurrently with cisplatin 

## 2024-06-13 NOTE — Patient Instructions (Signed)
 [facility] - A DEPT OF [facility]  Discharge Instructions: Thank you for choosing [facility] to provide your oncology and hematology care.   If you have a lab appointment with the Cancer Center, please go directly to the Cancer Center and check in at the registration area.   Wear comfortable clothing and clothing appropriate for easy access to any Portacath or PICC line.   We strive to give you quality time with your provider. You may need to reschedule your appointment if you arrive late (15 or more minutes).  Arriving late affects you and other patients whose appointments are after yours.  Also, if you miss three or more appointments without notifying the office, you may be dismissed from the clinic at the provider's discretion.      For prescription refill requests, have your pharmacy contact our office and allow 72 hours for refills to be completed.    Today you received the following chemotherapy and/or immunotherapy agents: Docetaxel  (Taxotere ) & Cisplatin  (Platinol )      To help prevent nausea and vomiting after your treatment, we encourage you to take your nausea medication as directed.  BELOW ARE SYMPTOMS THAT SHOULD BE REPORTED IMMEDIATELY: *FEVER GREATER THAN 100.4 F (38 C) OR HIGHER *CHILLS OR SWEATING *NAUSEA AND VOMITING THAT IS NOT CONTROLLED WITH YOUR NAUSEA MEDICATION *UNUSUAL SHORTNESS OF BREATH *UNUSUAL BRUISING OR BLEEDING *URINARY PROBLEMS (pain or burning when urinating, or frequent urination) *BOWEL PROBLEMS (unusual diarrhea, constipation, pain near the anus) TENDERNESS IN MOUTH AND THROAT WITH OR WITHOUT PRESENCE OF ULCERS (sore throat, sores in mouth, or a toothache) UNUSUAL RASH, SWELLING OR PAIN  UNUSUAL VAGINAL DISCHARGE OR ITCHING   Items with * indicate a potential emergency and should be followed up as soon as possible or go to the Emergency Department if any problems should occur.  Please show the  CHEMOTHERAPY ALERT CARD or IMMUNOTHERAPY ALERT CARD at check-in to the Emergency Department and triage nurse.  Should you have questions after your visit or need to cancel or reschedule your appointment, please contact [facility] - A DEPT OF [facility]  Dept: [phone]  and follow the prompts.  Office hours are 8:00 a.m. to 4:30 p.m. Monday - Friday. Please note that voicemails left after 4:00 p.m. may not be returned until the following business day.  We are closed weekends and major holidays. You have access to a nurse at all times for urgent questions. Please call the main number to the clinic Dept: [phone] and follow the prompts.   For any non-urgent questions, you may also contact your provider using MyChart. We now offer e-Visits for anyone 63 and older to request care online for non-urgent symptoms. For details visit [email or web address].   Also download the MyChart app! Go to the app store, search MyChart, open the app, select [facility] [place], and log in with your MyChart username and password.

## 2024-06-15 ENCOUNTER — Other Ambulatory Visit: Payer: Self-pay

## 2024-06-15 ENCOUNTER — Inpatient Hospital Stay

## 2024-06-15 ENCOUNTER — Telehealth: Payer: Self-pay

## 2024-06-15 VITALS — BP 132/87 | HR 51 | Temp 98.1°F | Resp 17

## 2024-06-15 DIAGNOSIS — C3412 Malignant neoplasm of upper lobe, left bronchus or lung: Secondary | ICD-10-CM

## 2024-06-15 DIAGNOSIS — Z5111 Encounter for antineoplastic chemotherapy: Secondary | ICD-10-CM | POA: Diagnosis not present

## 2024-06-15 MED ORDER — BACLOFEN 10 MG PO TABS
10.0000 mg | ORAL_TABLET | Freq: Three times a day (TID) | ORAL | 0 refills | Status: AC
Start: 2024-06-15 — End: ?

## 2024-06-15 MED ORDER — PEGFILGRASTIM-CBQV 6 MG/0.6ML ~~LOC~~ SOSY
6.0000 mg | PREFILLED_SYRINGE | Freq: Once | SUBCUTANEOUS | Status: AC
  Administered 2024-06-15: 6 mg via SUBCUTANEOUS
  Filled 2024-06-15: qty 0.6

## 2024-06-15 NOTE — Telephone Encounter (Signed)
 S/w regarding message about intractable hiccups. Patient reports that he had this same issue with his first cycle of treatment, but not his second. Patient receive Day 1 Cycle 3 treatment on Wednesday, 10/8 and is experiencing intractable hiccups again. Patient requesting refill of baclofen  10 mg which he received at ED following his first cycle.  Spoke with Dr. Tina who is assisting Dr. Sherrod, who agreed to refill patient's medication. [facility] [place]. Patient aware and voiced appreciation for the call.

## 2024-06-17 DIAGNOSIS — R931 Abnormal findings on diagnostic imaging of heart and coronary circulation: Secondary | ICD-10-CM | POA: Insufficient documentation

## 2024-06-17 NOTE — Progress Notes (Unsigned)
  Cardiology Office Note:   Date:  06/18/2024  ID:  Russell [name], DOB 1950-11-20, MRN [id number] PCP: Zollie Lowers, MD  [facility] Providers Cardiologist:  Lynwood Schilling, MD {  History of Present Illness:   Russell [name] is a 73 y.o. male with lung cancer who we saw prior to a robotic assisted left upper lobectomy with en bloc resection of chest wall and reconstruction on 03/12/2024.  He has non-small cell lung cancer.  Postoperative course complicated by an air leak and went home with a chest tube.  That resolved and his tube was removed in the office.  Prior to that surgery he had an echo and was found to have an EF of 60 - 65%.   CT chest earlier this year did demonstrate coronary calcium  and aortic atherosclerosis.  He was very active prior to his lung cancer surgery.  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  He is in his third course of cisplatin  75 Mg/M2 and docetaxel  75 Mg/M2 every 3 weeks.  He denies any cardiovascular symptoms. The patient denies any new symptoms such as chest discomfort, neck or arm discomfort. There has been no new shortness of breath, PND or orthopnea. There have been no reported palpitations, presyncope or syncope.   ROS: As stated in the HPI and negative for all other systems.  Studies Reviewed:    EKG:    Sinus rhythm, rate 57, axis within normal limits, intervals within normal limits, 05/06/2024.  Risk Assessment/Calculations:         Physical Exam:   VS:  There were no vitals taken for this visit.   Wt Readings from Last 3 Encounters:  06/13/24 147 lb 12.8 oz (67 kg)  06/12/24 145 lb (65.8 kg)  05/24/24 143 lb 11.2 oz (65.2 kg)     GEN: Well nourished, well developed in no acute distress NECK: No JVD; No carotid bruits CARDIAC: RRR, no murmurs, rubs, gallops RESPIRATORY:  Clear to auscultation without rales, wheezing or rhonchi  ABDOMEN: Soft, non-tender, non-distended EXTREMITIES:  No edema; No deformity   ASSESSMENT AND  PLAN:       Elevated coronary calcium : The patient has no symptoms.  He has a high functional level.  We extensively reviewed the meaning of elevated coronary calcium .  At this point in the absence of symptoms I do not think further cardiovascular testing is suggested but he needs continued risk reduction.  We talked about that at length.  HTN: His blood pressure is controlled.  No change in therapy.  Dyslipidemia: He is at target with his statins.  His LDL was 56 in August.  No change in therapy.   Follow up with me as needed.   Signed, Lynwood Schilling, MD

## 2024-06-18 ENCOUNTER — Ambulatory Visit: Attending: Cardiology | Admitting: Cardiology

## 2024-06-18 ENCOUNTER — Inpatient Hospital Stay

## 2024-06-18 ENCOUNTER — Encounter: Payer: Self-pay | Admitting: Cardiology

## 2024-06-18 VITALS — BP 127/76 | HR 54

## 2024-06-18 DIAGNOSIS — R931 Abnormal findings on diagnostic imaging of heart and coronary circulation: Secondary | ICD-10-CM | POA: Diagnosis not present

## 2024-06-18 DIAGNOSIS — I700 Atherosclerosis of aorta: Secondary | ICD-10-CM

## 2024-06-18 NOTE — Patient Instructions (Signed)

## 2024-06-20 ENCOUNTER — Inpatient Hospital Stay

## 2024-06-20 DIAGNOSIS — C3412 Malignant neoplasm of upper lobe, left bronchus or lung: Secondary | ICD-10-CM

## 2024-06-20 DIAGNOSIS — Z5111 Encounter for antineoplastic chemotherapy: Secondary | ICD-10-CM | POA: Diagnosis not present

## 2024-06-20 LAB — MAGNESIUM: Magnesium: 1.7 mg/dL (ref 1.7–2.4)

## 2024-06-20 LAB — CMP (CANCER CENTER ONLY)
ALT: 12 U/L (ref 0–44)
AST: 14 U/L — ABNORMAL LOW (ref 15–41)
Albumin: 3.5 g/dL (ref 3.5–5.0)
Alkaline Phosphatase: 90 U/L (ref 38–126)
Anion gap: 4 — ABNORMAL LOW (ref 5–15)
BUN: 24 mg/dL — ABNORMAL HIGH (ref 8–23)
CO2: 29 mmol/L (ref 22–32)
Calcium: 8.8 mg/dL — ABNORMAL LOW (ref 8.9–10.3)
Chloride: 103 mmol/L (ref 98–111)
Creatinine: 0.97 mg/dL (ref 0.61–1.24)
GFR, Estimated: >60 mL/min (ref >60)
Glucose, Bld: 77 mg/dL (ref 70–99)
Potassium: 4.2 mmol/L (ref 3.5–5.1)
Sodium: 136 mmol/L (ref 135–145)
Total Bilirubin: 0.5 mg/dL (ref 0.0–1.2)
Total Protein: 5.8 g/dL — ABNORMAL LOW (ref 6.5–8.1)

## 2024-06-20 LAB — CBC WITH DIFFERENTIAL (CANCER CENTER ONLY)
Abs Immature Granulocytes: 0.19 K/uL — ABNORMAL HIGH (ref 0.00–0.07)
Basophils Absolute: 0 K/uL (ref 0.0–0.1)
Basophils Relative: 1 %
Eosinophils Absolute: 0.1 K/uL (ref 0.0–0.5)
Eosinophils Relative: 3 %
HCT: 32.7 % — ABNORMAL LOW (ref 39.0–52.0)
Hemoglobin: 10.8 g/dL — ABNORMAL LOW (ref 13.0–17.0)
Immature Granulocytes: 4 %
Lymphocytes Relative: 26 %
Lymphs Abs: 1.3 K/uL (ref 0.7–4.0)
MCH: 28.9 pg (ref 26.0–34.0)
MCHC: 33 g/dL (ref 30.0–36.0)
MCV: 87.4 fL (ref 80.0–100.0)
Monocytes Absolute: 0.5 K/uL (ref 0.1–1.0)
Monocytes Relative: 11 %
Neutro Abs: 2.7 K/uL (ref 1.7–7.7)
Neutrophils Relative %: 55 %
Platelet Count: 179 K/uL (ref 150–400)
RBC: 3.74 MIL/uL — ABNORMAL LOW (ref 4.22–5.81)
RDW: 14.7 % (ref 11.5–15.5)
WBC Count: 4.8 K/uL (ref 4.0–10.5)
nRBC: 0 % (ref 0.0–0.2)

## 2024-06-25 ENCOUNTER — Inpatient Hospital Stay

## 2024-06-27 ENCOUNTER — Inpatient Hospital Stay

## 2024-06-27 DIAGNOSIS — C3412 Malignant neoplasm of upper lobe, left bronchus or lung: Secondary | ICD-10-CM | POA: Diagnosis not present

## 2024-06-27 DIAGNOSIS — Z5111 Encounter for antineoplastic chemotherapy: Secondary | ICD-10-CM | POA: Diagnosis not present

## 2024-06-27 LAB — CBC WITH DIFFERENTIAL (CANCER CENTER ONLY)
Abs Immature Granulocytes: 0.31 K/uL — ABNORMAL HIGH (ref 0.00–0.07)
Basophils Absolute: 0.1 K/uL (ref 0.0–0.1)
Basophils Relative: 0 %
Eosinophils Absolute: 0 K/uL (ref 0.0–0.5)
Eosinophils Relative: 0 %
HCT: 32.1 % — ABNORMAL LOW (ref 39.0–52.0)
Hemoglobin: 10.4 g/dL — ABNORMAL LOW (ref 13.0–17.0)
Immature Granulocytes: 2 %
Lymphocytes Relative: 9 %
Lymphs Abs: 1.6 K/uL (ref 0.7–4.0)
MCH: 28.7 pg (ref 26.0–34.0)
MCHC: 32.4 g/dL (ref 30.0–36.0)
MCV: 88.7 fL (ref 80.0–100.0)
Monocytes Absolute: 0.6 K/uL (ref 0.1–1.0)
Monocytes Relative: 3 %
Neutro Abs: 15.7 K/uL — ABNORMAL HIGH (ref 1.7–7.7)
Neutrophils Relative %: 86 %
Platelet Count: 216 K/uL (ref 150–400)
RBC: 3.62 MIL/uL — ABNORMAL LOW (ref 4.22–5.81)
RDW: 15.3 % (ref 11.5–15.5)
WBC Count: 18.3 K/uL — ABNORMAL HIGH (ref 4.0–10.5)
nRBC: 0 % (ref 0.0–0.2)

## 2024-06-27 LAB — CMP (CANCER CENTER ONLY)
ALT: 9 U/L (ref 0–44)
AST: 10 U/L — ABNORMAL LOW (ref 15–41)
Albumin: 3.4 g/dL — ABNORMAL LOW (ref 3.5–5.0)
Alkaline Phosphatase: 109 U/L (ref 38–126)
Anion gap: 3 — ABNORMAL LOW (ref 5–15)
BUN: 8 mg/dL (ref 8–23)
CO2: 30 mmol/L (ref 22–32)
Calcium: 9.1 mg/dL (ref 8.9–10.3)
Chloride: 104 mmol/L (ref 98–111)
Creatinine: 0.94 mg/dL (ref 0.61–1.24)
GFR, Estimated: >60 mL/min (ref >60)
Glucose, Bld: 91 mg/dL (ref 70–99)
Potassium: 5.2 mmol/L — ABNORMAL HIGH (ref 3.5–5.1)
Sodium: 137 mmol/L (ref 135–145)
Total Bilirubin: 0.3 mg/dL (ref 0.0–1.2)
Total Protein: 5.9 g/dL — ABNORMAL LOW (ref 6.5–8.1)

## 2024-06-27 LAB — MAGNESIUM: Magnesium: 1.8 mg/dL (ref 1.7–2.4)

## 2024-06-29 NOTE — Progress Notes (Signed)
 [facility] OFFICE PROGRESS NOTE  Zollie Lowers, MD [address] [place] [address]  DIAGNOSIS: Stage IIb (T3, N0, M0) non-small cell lung cancer, squamous cell carcinoma presented with large cavitary left upper lobe lung mass diagnosed in May 2025. PD-L1 expression was 10%   PRIOR THERAPY: Status post left upper lobectomy with resection of the left lower lobe in addition to en bloc resection of the chest wall including left 3rd and 4th rib on 03/12/2024 under the care of Dr. Kerrin.    CURRENT THERAPY: Adjuvant systemic chemotherapy with cisplatin  75 Mg/M2 and docetaxel  75 Mg/M2 every 3 weeks. First dose 05/03/2024. Status post 3 cycles.   INTERVAL HISTORY: Russell [name] 73 y.o. male returns to the clinic today for a follow-up visit. The patient was last seen in the clinic on 06/12/2024 by Dr. Sherrod.  The patient is currently undergoing adjuvant systemic chemotherapy with cisplatin  and docetaxel .  The patient had a challenging time with his first treatment.  With preemptive supportive care and reeducation the patient tolerated cycle 2 and 3 much better.  He did have some recurring hiccups after cycle #3 and cycle #1 for which he is prescribed baclofen .  Today he denies any fever, chills, night sweats, or unexplained weight loss.  His appetite is good at this time. His appetite may be down a few days after treatment then it recovers.  He denies any signs and symptoms of infection including nasal congestion, sore throat, or urinary tract infection.  He denies any significant shortness of breath, cough, hemoptysis, or chest pain.  He denies any nausea or vomiting. He had mild diarrhea after the last treatment but has imodium  and lomotil  if needed. He denies any constipation.  He denies any rashes or skin changes.  He denies any peripheral neuropathy.  He denies any headache or visual changes.  He is here today for evaluation and repeat blood work before undergoing cycle  #4.   MEDICAL HISTORY: Past Medical History:  Diagnosis Date   Dyspnea    with exertion   GERD (gastroesophageal reflux disease)    History of hiatal hernia 07/22/2006   small - dx by endoscopy   Hx of adenomatous polyp of colon 03/24/2016   Hyperlipidemia    Hypertension    Lung nodule 01/04/2024   left upper lobe   Pneumonia    x 1 as teenager   Pneumothorax after biopsy 10/17/2023   Pulmonary nodule 04/27/2023   Status post lobectomy of lung 03/12/2024    ALLERGIES:  has no known allergies.  MEDICATIONS:  Current Outpatient Medications  Medication Sig Dispense Refill   amLODipine  (NORVASC ) 5 MG tablet TAKE 1 TABLET EVERY DAY FOR BLOOD PRESSURE 90 tablet 3   ascorbic acid (VITAMIN C) 500 MG tablet Take 500 mg by mouth daily. Takes every other day     aspirin  EC 81 MG tablet Take 81 mg by mouth every other day.      baclofen  (LIORESAL ) 10 MG tablet Take 1 tablet (10 mg total) by mouth 3 (three) times daily. (Patient taking differently: Take 10 mg by mouth as needed for muscle spasms.) 30 each 0   Cholecalciferol (VITAMIN D3) 125 MCG (5000 UT) CAPS Take 5,000 Units by mouth every other day.     cyanocobalamin  (VITAMIN B12) 1000 MCG tablet Take 1,000 mcg by mouth every other day.     diphenoxylate -atropine  (LOMOTIL ) 2.5-0.025 MG tablet Take 2 tablets by mouth 4 (four) times daily as needed for diarrhea  or loose stools. 30 tablet 5   loperamide  (IMODIUM ) 2 MG capsule Take 1 capsule (2 mg total) by mouth 4 (four) times daily as needed for diarrhea or loose stools. 12 capsule 0   nystatin  (MYCOSTATIN ) 100000 UNIT/ML suspension Take 5 mLs (500,000 Units total) by mouth 4 (four) times daily. (Patient taking differently: Take 5 mLs by mouth as needed.) 60 mL 0   olmesartan  (BENICAR ) 40 MG tablet Take 1 tablet (40 mg total) by mouth daily. For blood pressure 90 tablet 3   ondansetron  (ZOFRAN ) 8 MG tablet Take 1 tablet (8 mg total) by mouth every 8 (eight) hours as needed for nausea or  vomiting. Begin on the third day after cisplatin  chemotherapy. 30 tablet 1   prochlorperazine  (COMPAZINE ) 10 MG tablet Take 1 tablet (10 mg total) by mouth every 6 (six) hours as needed for nausea or vomiting. 30 tablet 1   rosuvastatin  (CRESTOR ) 10 MG tablet Take 1 tablet (10 mg total) by mouth every other day. 90 tablet 1   dexamethasone  (DECADRON ) 4 MG tablet Take 2 tabs (8 mg) twice daily starting the day before docetaxel , the day of docetaxel , and the day after docetaxel , then on days 4 and 5 take 2 tablets in the morning if needed for nausea 12 tablet 0   No current facility-administered medications for this visit.    SURGICAL HISTORY:  Past Surgical History:  Procedure Laterality Date   BRONCHIAL BIOPSY  10/17/2023   Procedure: BRONCHIAL BIOPSIES;  Surgeon: Shelah Lamar RAMAN, MD;  Location: [facility] ENDOSCOPY;  Service: Pulmonary;;   BRONCHIAL BIOPSY  01/17/2024   Procedure: BRONCHOSCOPY, WITH BIOPSY;  Surgeon: Shelah Lamar RAMAN, MD;  Location: [facility] ENDOSCOPY;  Service: Pulmonary;;   BRONCHIAL BRUSHINGS  10/17/2023   Procedure: BRONCHIAL BRUSHINGS;  Surgeon: Shelah Lamar RAMAN, MD;  Location: [facility] ENDOSCOPY;  Service: Pulmonary;;   BRONCHIAL BRUSHINGS  01/17/2024   Procedure: BRONCHOSCOPY, WITH BRUSH BIOPSY;  Surgeon: Shelah Lamar RAMAN, MD;  Location: [facility] ENDOSCOPY;  Service: Pulmonary;;   BRONCHIAL NEEDLE ASPIRATION BIOPSY  10/17/2023   Procedure: BRONCHIAL NEEDLE ASPIRATION BIOPSIES;  Surgeon: Shelah Lamar RAMAN, MD;  Location: [facility] ENDOSCOPY;  Service: Pulmonary;;   BRONCHIAL NEEDLE ASPIRATION BIOPSY  01/17/2024   Procedure: BRONCHOSCOPY, WITH NEEDLE ASPIRATION BIOPSY;  Surgeon: Shelah Lamar RAMAN, MD;  Location: [facility] ENDOSCOPY;  Service: Pulmonary;;   BRONCHIAL WASHINGS  10/17/2023   Procedure: BRONCHIAL WASHINGS;  Surgeon: Shelah Lamar RAMAN, MD;  Location: [facility] ENDOSCOPY;  Service: Pulmonary;;   BRONCHIAL WASHINGS  01/17/2024   Procedure: IRRIGATION, BRONCHUS;  Surgeon: Shelah Lamar RAMAN, MD;  Location: [facility] ENDOSCOPY;   Service: Pulmonary;;   CHEST WALL RECONSTRUCTION Left 03/12/2024   Procedure: RECONSTRUCTION, MAJOR, CHEST WALL;  Surgeon: Kerrin Elspeth BROCKS, MD;  Location: [facility] OR;  Service: Thoracic;  Laterality: Left;   COLONOSCOPY  09/07/2003   gessner - hx polyp; colonoscopy x several - last one on 06/04/21   cyst removal from gum     FRACTURE SURGERY Left age 82   Left Knee   FUDUCIAL PLACEMENT  01/17/2024   Procedure: INSERTION, FIDUCIAL MARKER, GOLD;  Surgeon: Shelah Lamar RAMAN, MD;  Location: [facility] ENDOSCOPY;  Service: Pulmonary;;   HERNIA REPAIR     INTERCOSTAL NERVE BLOCK Left 03/12/2024   Procedure: BLOCK, NERVE, INTERCOSTAL;  Surgeon: Kerrin Elspeth BROCKS, MD;  Location: [facility] OR;  Service: Thoracic;  Laterality: Left;   LOBECTOMY, LUNG, ROBOT-ASSISTED, USING VATS Left 03/12/2024   Procedure: LOBECTOMY, LUNG, ROBOT-ASSISTED, USING VATS;  Surgeon: Kerrin Elspeth BROCKS,  MD;  Location: [facility] OR;  Service: Thoracic;  Laterality: Left;  LEFT ROBOTIC LEFT UPPER LOBECTOMY, POSSIBLE THORACOTOMY, POSSIBLE CHEST WALL RESECTION   LYMPH NODE BIOPSY Left 03/12/2024   Procedure: LYMPH NODE BIOPSY;  Surgeon: Kerrin Elspeth BROCKS, MD;  Location: [facility] OR;  Service: Thoracic;  Laterality: Left;   THORACOTOMY Left 03/12/2024   Procedure: THORACOTOMY, MAJOR;  Surgeon: Kerrin Elspeth BROCKS, MD;  Location: [facility] OR;  Service: Thoracic;  Laterality: Left;   UPPER GASTROINTESTINAL ENDOSCOPY     UPPER GI ENDOSCOPY     Normal   VIDEO BRONCHOSCOPY WITH ENDOBRONCHIAL NAVIGATION Left 01/17/2024   Procedure: VIDEO BRONCHOSCOPY WITH ENDOBRONCHIAL NAVIGATION;  Surgeon: Shelah Lamar RAMAN, MD;  Location: [facility] ENDOSCOPY;  Service: Pulmonary;  Laterality: Left;   WISDOM TOOTH EXTRACTION      REVIEW OF SYSTEMS:   Review of Systems  Constitutional: Fatigue following treatment. Negative for appetite change, chills, fever and unexpected weight change.  HENT: Negative for mouth sores, nosebleeds, sore throat and trouble swallowing.   Eyes: Negative for  eye problems and icterus.  Respiratory: Negative for cough, hemoptysis, shortness of breath and wheezing.   Cardiovascular: Negative for chest pain and leg swelling.  Gastrointestinal: Negative for abdominal pain, constipation, diarrhea (none at this time), nausea and vomiting.  Genitourinary: Negative for bladder incontinence, difficulty urinating, dysuria, frequency and hematuria.   Musculoskeletal: Negative for back pain, gait problem, neck pain and neck stiffness.  Skin: Negative for itching and rash.  Neurological: Negative for dizziness, extremity weakness, gait problem, headaches, light-headedness and seizures.  Hematological: Negative for adenopathy. Does not bruise/bleed easily.  Psychiatric/Behavioral: Negative for confusion, depression and sleep disturbance. The patient is not nervous/anxious.     PHYSICAL EXAMINATION:  Blood pressure 138/76, pulse 64, temperature 98.1 F (36.7 C), temperature source Temporal, resp. rate 13, weight 148 lb 8 oz (67.4 kg), SpO2 99%.  ECOG PERFORMANCE STATUS: 1  Physical Exam  Constitutional: Oriented to person, place, and time and well-developed, well-nourished, and in no distress.  HENT:  Head: Normocephalic and atraumatic.  Mouth/Throat: Oropharynx is clear and moist. No oropharyngeal exudate.  Eyes: Conjunctivae are normal. Right eye exhibits no discharge. Left eye exhibits no discharge. No scleral icterus.  Neck: Normal range of motion. Neck supple.  Cardiovascular: Normal rate, regular rhythm, normal heart sounds and intact distal pulses.   Pulmonary/Chest: Effort normal and breath sounds normal. No respiratory distress. No wheezes. No rales.  Abdominal: Soft. Bowel sounds are normal. Exhibits no distension and no mass. There is no tenderness.  Musculoskeletal: Normal range of motion. Exhibits no edema.  Lymphadenopathy:    No cervical adenopathy.  Neurological: Alert and oriented to person, place, and time. Exhibits normal muscle tone.  Gait normal. Coordination normal.  Skin: Skin is warm and dry. No rash noted. Not diaphoretic. No erythema. No pallor.  Psychiatric: Mood, memory and judgment normal.  Vitals reviewed.  LABORATORY DATA: Lab Results  Component Value Date   WBC 22.2 (H) 07/04/2024   HGB 10.7 (L) 07/04/2024   HCT 32.2 (L) 07/04/2024   MCV 88.2 07/04/2024   PLT 299 07/04/2024      Chemistry      Component Value Date/Time   NA 136 07/04/2024 0835   NA 139 04/11/2024 0917   K 4.6 07/04/2024 0835   CL 105 07/04/2024 0835   CO2 25 07/04/2024 0835   BUN 18 07/04/2024 0835   BUN 16 04/11/2024 0917   CREATININE 0.86 07/04/2024 0835      Component Value  Date/Time   CALCIUM  9.2 07/04/2024 0835   ALKPHOS 85 07/04/2024 0835   AST 11 (L) 07/04/2024 0835   ALT 8 07/04/2024 0835   BILITOT 0.3 07/04/2024 0835       RADIOGRAPHIC STUDIES:  No results found.   ASSESSMENT/PLAN:  This is a very pleasant 73 year old Caucasian male with stage IIb (T3, N0, M0) non-small cell lung cancer, squamous cell carcinoma presented with large cavitary left upper lobe lung mass status post left upper lobectomy with resection of the left lower lobe in addition to en bloc resection of the chest wall including left 3rd and 4th rib on 03/12/2024 under the care of Dr. Kerrin PD-L1 expression was 10%   He is currently on adjuvant chemotherapy with cisplatin  and docetaxel . His first dose was on 05/03/24. He is status post 3 cycles.   Labs were reviewed.  Recommend that he proceed with #4 today scheduled.  I will arrange for restaging CT scan of the chest prior to his next appointment.  Experiencing decreased appetite and fatigue. Steroids prescribed to manage symptoms. Encouraged small, frequent meals and protein drinks. - Take steroids as prescribed: two tablets twice a day for three days, with the option to extend for one to two more days if needed.   Continue to monitor his labs closely on a weekly basis.  He has  baclofen  and gabapentin  to alternate if needed if he experiences hiccups.   Will see him back in 3 weeks to review his scan results and discuss next steps.  He will use imodium  and/or lomotil  if needed for diarrhea.  Dr. Sherrod will decide at his next appointment if he will offer adjuvant immunotherapy or not.   The patient was advised to call immediately if he has any concerning symptoms in the interval. The patient voices understanding of current disease status and treatment options and is in agreement with the current care plan. All questions were answered. The patient knows to call the clinic with any problems, questions or concerns. We can certainly see the patient much sooner if necessary    Orders Placed This Encounter  Procedures   CT Chest W Contrast    Standing Status:   Future    Expected Date:   07/18/2024    Expiration Date:   07/04/2025    If indicated for the ordered procedure, I authorize the administration of contrast media per Radiology protocol:   Yes    Does the patient have a contrast media/X-ray dye allergy ?:   No    Preferred imaging location?:   [facility]     The total time spent in the appointment was 20-29 minutes  Luisalberto Beegle L Erma Raiche, PA-C 07/04/24

## 2024-07-04 ENCOUNTER — Inpatient Hospital Stay

## 2024-07-04 ENCOUNTER — Other Ambulatory Visit: Payer: Self-pay | Admitting: Physician Assistant

## 2024-07-04 ENCOUNTER — Inpatient Hospital Stay (HOSPITAL_BASED_OUTPATIENT_CLINIC_OR_DEPARTMENT_OTHER): Admitting: Physician Assistant

## 2024-07-04 VITALS — BP 138/76 | HR 64 | Temp 98.1°F | Resp 13 | Wt 148.5 lb

## 2024-07-04 DIAGNOSIS — C3412 Malignant neoplasm of upper lobe, left bronchus or lung: Secondary | ICD-10-CM

## 2024-07-04 DIAGNOSIS — Z5111 Encounter for antineoplastic chemotherapy: Secondary | ICD-10-CM | POA: Diagnosis not present

## 2024-07-04 DIAGNOSIS — Z5189 Encounter for other specified aftercare: Secondary | ICD-10-CM | POA: Diagnosis not present

## 2024-07-04 LAB — CBC WITH DIFFERENTIAL (CANCER CENTER ONLY)
Abs Immature Granulocytes: 0.25 K/uL — ABNORMAL HIGH (ref 0.00–0.07)
Basophils Absolute: 0 K/uL (ref 0.0–0.1)
Basophils Relative: 0 %
Eosinophils Absolute: 0 K/uL (ref 0.0–0.5)
Eosinophils Relative: 0 %
HCT: 32.2 % — ABNORMAL LOW (ref 39.0–52.0)
Hemoglobin: 10.7 g/dL — ABNORMAL LOW (ref 13.0–17.0)
Immature Granulocytes: 1 %
Lymphocytes Relative: 6 %
Lymphs Abs: 1.2 K/uL (ref 0.7–4.0)
MCH: 29.3 pg (ref 26.0–34.0)
MCHC: 33.2 g/dL (ref 30.0–36.0)
MCV: 88.2 fL (ref 80.0–100.0)
Monocytes Absolute: 0.5 K/uL (ref 0.1–1.0)
Monocytes Relative: 2 %
Neutro Abs: 20.2 K/uL — ABNORMAL HIGH (ref 1.7–7.7)
Neutrophils Relative %: 91 %
Platelet Count: 299 K/uL (ref 150–400)
RBC: 3.65 MIL/uL — ABNORMAL LOW (ref 4.22–5.81)
RDW: 15.9 % — ABNORMAL HIGH (ref 11.5–15.5)
WBC Count: 22.2 K/uL — ABNORMAL HIGH (ref 4.0–10.5)
nRBC: 0 % (ref 0.0–0.2)

## 2024-07-04 LAB — CMP (CANCER CENTER ONLY)
ALT: 8 U/L (ref 0–44)
AST: 11 U/L — ABNORMAL LOW (ref 15–41)
Albumin: 4 g/dL (ref 3.5–5.0)
Alkaline Phosphatase: 85 U/L (ref 38–126)
Anion gap: 6 (ref 5–15)
BUN: 18 mg/dL (ref 8–23)
CO2: 25 mmol/L (ref 22–32)
Calcium: 9.2 mg/dL (ref 8.9–10.3)
Chloride: 105 mmol/L (ref 98–111)
Creatinine: 0.86 mg/dL (ref 0.61–1.24)
GFR, Estimated: >60 mL/min (ref >60)
Glucose, Bld: 122 mg/dL — ABNORMAL HIGH (ref 70–99)
Potassium: 4.6 mmol/L (ref 3.5–5.1)
Sodium: 136 mmol/L (ref 135–145)
Total Bilirubin: 0.3 mg/dL (ref 0.0–1.2)
Total Protein: 6.8 g/dL (ref 6.5–8.1)

## 2024-07-04 LAB — MAGNESIUM: Magnesium: 2 mg/dL (ref 1.7–2.4)

## 2024-07-04 MED ORDER — MAGNESIUM SULFATE 2 GM/50ML IV SOLN
2.0000 g | Freq: Once | INTRAVENOUS | Status: AC
  Administered 2024-07-04: 2 g via INTRAVENOUS
  Filled 2024-07-04: qty 50

## 2024-07-04 MED ORDER — DEXAMETHASONE SOD PHOSPHATE PF 10 MG/ML IJ SOLN
10.0000 mg | Freq: Once | INTRAMUSCULAR | Status: AC
  Administered 2024-07-04: 10 mg via INTRAVENOUS

## 2024-07-04 MED ORDER — SODIUM CHLORIDE 0.9 % IV SOLN
75.0000 mg/m2 | Freq: Once | INTRAVENOUS | Status: AC
  Administered 2024-07-04: 133 mg via INTRAVENOUS
  Filled 2024-07-04: qty 13.3

## 2024-07-04 MED ORDER — SODIUM CHLORIDE 0.9 % IV SOLN: INTRAVENOUS | Status: DC

## 2024-07-04 MED ORDER — DEXAMETHASONE 4 MG PO TABS: ORAL_TABLET | ORAL | 0 refills | Status: DC

## 2024-07-04 MED ORDER — POTASSIUM CHLORIDE IN NACL 20-0.9 MEQ/L-% IV SOLN
Freq: Once | INTRAVENOUS | Status: AC
  Filled 2024-07-04: qty 1000

## 2024-07-04 MED ORDER — SODIUM CHLORIDE 0.9 % IV SOLN
75.0000 mg/m2 | Freq: Once | INTRAVENOUS | Status: AC
  Administered 2024-07-04: 133 mg via INTRAVENOUS
  Filled 2024-07-04: qty 133

## 2024-07-04 MED ORDER — PALONOSETRON HCL INJECTION 0.25 MG/5ML
0.2500 mg | Freq: Once | INTRAVENOUS | Status: AC
  Administered 2024-07-04: 0.25 mg via INTRAVENOUS
  Filled 2024-07-04: qty 5

## 2024-07-04 MED ORDER — APREPITANT 130 MG/18ML IV EMUL
130.0000 mg | Freq: Once | INTRAVENOUS | Status: AC
  Administered 2024-07-04: 130 mg via INTRAVENOUS
  Filled 2024-07-04: qty 18

## 2024-07-04 NOTE — Telephone Encounter (Signed)
Responded in different encounter

## 2024-07-04 NOTE — Patient Instructions (Signed)
 [facility] - A DEPT OF [facility]  Discharge Instructions: Thank you for choosing [facility] to provide your oncology and hematology care.   If you have a lab appointment with the Cancer Center, please go directly to the Cancer Center and check in at the registration area.   Wear comfortable clothing and clothing appropriate for easy access to any Portacath or PICC line.   We strive to give you quality time with your provider. You may need to reschedule your appointment if you arrive late (15 or more minutes).  Arriving late affects you and other patients whose appointments are after yours.  Also, if you miss three or more appointments without notifying the office, you may be dismissed from the clinic at the provider's discretion.      For prescription refill requests, have your pharmacy contact our office and allow 72 hours for refills to be completed.    Today you received the following chemotherapy and/or immunotherapy agents: CISplatin  (PLATINOL ), DOCEtaxel  (TAXOTERE )      To help prevent nausea and vomiting after your treatment, we encourage you to take your nausea medication as directed.  BELOW ARE SYMPTOMS THAT SHOULD BE REPORTED IMMEDIATELY: *FEVER GREATER THAN 100.4 F (38 C) OR HIGHER *CHILLS OR SWEATING *NAUSEA AND VOMITING THAT IS NOT CONTROLLED WITH YOUR NAUSEA MEDICATION *UNUSUAL SHORTNESS OF BREATH *UNUSUAL BRUISING OR BLEEDING *URINARY PROBLEMS (pain or burning when urinating, or frequent urination) *BOWEL PROBLEMS (unusual diarrhea, constipation, pain near the anus) TENDERNESS IN MOUTH AND THROAT WITH OR WITHOUT PRESENCE OF ULCERS (sore throat, sores in mouth, or a toothache) UNUSUAL RASH, SWELLING OR PAIN  UNUSUAL VAGINAL DISCHARGE OR ITCHING   Items with * indicate a potential emergency and should be followed up as soon as possible or go to the Emergency Department if any problems should occur.  Please show the  CHEMOTHERAPY ALERT CARD or IMMUNOTHERAPY ALERT CARD at check-in to the Emergency Department and triage nurse.  Should you have questions after your visit or need to cancel or reschedule your appointment, please contact [facility] - A DEPT OF [facility]  Dept: [phone]  and follow the prompts.  Office hours are 8:00 a.m. to 4:30 p.m. Monday - Friday. Please note that voicemails left after 4:00 p.m. may not be returned until the following business day.  We are closed weekends and major holidays. You have access to a nurse at all times for urgent questions. Please call the main number to the clinic Dept: [phone] and follow the prompts.   For any non-urgent questions, you may also contact your provider using MyChart. We now offer e-Visits for anyone 65 and older to request care online for non-urgent symptoms. For details visit [email or web address].   Also download the MyChart app! Go to the app store, search MyChart, open the app, select [place], and log in with your MyChart username and password.

## 2024-07-06 ENCOUNTER — Inpatient Hospital Stay

## 2024-07-06 ENCOUNTER — Telehealth: Payer: Self-pay | Admitting: Physician Assistant

## 2024-07-06 VITALS — BP 142/61 | HR 56 | Temp 97.9°F | Resp 16

## 2024-07-06 DIAGNOSIS — Z5111 Encounter for antineoplastic chemotherapy: Secondary | ICD-10-CM | POA: Diagnosis not present

## 2024-07-06 DIAGNOSIS — C3412 Malignant neoplasm of upper lobe, left bronchus or lung: Secondary | ICD-10-CM

## 2024-07-06 MED ORDER — PEGFILGRASTIM-CBQV 6 MG/0.6ML ~~LOC~~ SOSY
6.0000 mg | PREFILLED_SYRINGE | Freq: Once | SUBCUTANEOUS | Status: AC
  Administered 2024-07-06: 6 mg via SUBCUTANEOUS
  Filled 2024-07-06: qty 0.6

## 2024-07-06 NOTE — Telephone Encounter (Signed)
 Scheduled patient for next appointments. Called and spoke with the patient, he is aware.

## 2024-07-09 ENCOUNTER — Inpatient Hospital Stay

## 2024-07-11 ENCOUNTER — Inpatient Hospital Stay: Attending: Internal Medicine

## 2024-07-11 DIAGNOSIS — D6481 Anemia due to antineoplastic chemotherapy: Secondary | ICD-10-CM | POA: Diagnosis not present

## 2024-07-11 DIAGNOSIS — C3412 Malignant neoplasm of upper lobe, left bronchus or lung: Secondary | ICD-10-CM | POA: Insufficient documentation

## 2024-07-11 DIAGNOSIS — G620 Drug-induced polyneuropathy: Secondary | ICD-10-CM | POA: Insufficient documentation

## 2024-07-11 LAB — CMP (CANCER CENTER ONLY)
ALT: 10 U/L (ref 0–44)
AST: 12 U/L — ABNORMAL LOW (ref 15–41)
Albumin: 3.5 g/dL (ref 3.5–5.0)
Alkaline Phosphatase: 78 U/L (ref 38–126)
Anion gap: 5 (ref 5–15)
BUN: 16 mg/dL (ref 8–23)
CO2: 27 mmol/L (ref 22–32)
Calcium: 8.4 mg/dL — ABNORMAL LOW (ref 8.9–10.3)
Chloride: 102 mmol/L (ref 98–111)
Creatinine: 0.85 mg/dL (ref 0.61–1.24)
GFR, Estimated: >60 mL/min (ref >60)
Glucose, Bld: 90 mg/dL (ref 70–99)
Potassium: 4.5 mmol/L (ref 3.5–5.1)
Sodium: 134 mmol/L — ABNORMAL LOW (ref 135–145)
Total Bilirubin: 0.6 mg/dL (ref 0.0–1.2)
Total Protein: 5.8 g/dL — ABNORMAL LOW (ref 6.5–8.1)

## 2024-07-11 LAB — CBC WITH DIFFERENTIAL (CANCER CENTER ONLY)
Abs Immature Granulocytes: 0.22 K/uL — ABNORMAL HIGH (ref 0.00–0.07)
Basophils Absolute: 0 K/uL (ref 0.0–0.1)
Basophils Relative: 1 %
Eosinophils Absolute: 0.1 K/uL (ref 0.0–0.5)
Eosinophils Relative: 2 %
HCT: 32 % — ABNORMAL LOW (ref 39.0–52.0)
Hemoglobin: 10.5 g/dL — ABNORMAL LOW (ref 13.0–17.0)
Immature Granulocytes: 7 %
Lymphocytes Relative: 27 %
Lymphs Abs: 0.8 K/uL (ref 0.7–4.0)
MCH: 28.9 pg (ref 26.0–34.0)
MCHC: 32.8 g/dL (ref 30.0–36.0)
MCV: 88.2 fL (ref 80.0–100.0)
Monocytes Absolute: 0.4 K/uL (ref 0.1–1.0)
Monocytes Relative: 13 %
Neutro Abs: 1.5 K/uL — ABNORMAL LOW (ref 1.7–7.7)
Neutrophils Relative %: 50 %
Platelet Count: 127 K/uL — ABNORMAL LOW (ref 150–400)
RBC: 3.63 MIL/uL — ABNORMAL LOW (ref 4.22–5.81)
RDW: 16.2 % — ABNORMAL HIGH (ref 11.5–15.5)
WBC Count: 3.1 K/uL — ABNORMAL LOW (ref 4.0–10.5)
nRBC: 0 % (ref 0.0–0.2)

## 2024-07-11 LAB — MAGNESIUM: Magnesium: 1.7 mg/dL (ref 1.7–2.4)

## 2024-07-17 ENCOUNTER — Encounter: Payer: Self-pay | Admitting: Internal Medicine

## 2024-07-18 ENCOUNTER — Ambulatory Visit (HOSPITAL_COMMUNITY)
Admission: RE | Admit: 2024-07-18 | Discharge: 2024-07-18 | Disposition: A | Discharge status: Home / Self Care | Source: Ambulatory Visit | Attending: Physician Assistant | Admitting: Physician Assistant

## 2024-07-18 ENCOUNTER — Inpatient Hospital Stay

## 2024-07-18 ENCOUNTER — Other Ambulatory Visit: Payer: Self-pay | Admitting: Physician Assistant

## 2024-07-18 DIAGNOSIS — C3412 Malignant neoplasm of upper lobe, left bronchus or lung: Secondary | ICD-10-CM

## 2024-07-18 DIAGNOSIS — J432 Centrilobular emphysema: Secondary | ICD-10-CM | POA: Diagnosis not present

## 2024-07-18 DIAGNOSIS — C3490 Malignant neoplasm of unspecified part of unspecified bronchus or lung: Secondary | ICD-10-CM | POA: Diagnosis not present

## 2024-07-18 DIAGNOSIS — I700 Atherosclerosis of aorta: Secondary | ICD-10-CM | POA: Diagnosis not present

## 2024-07-18 DIAGNOSIS — J90 Pleural effusion, not elsewhere classified: Secondary | ICD-10-CM | POA: Diagnosis not present

## 2024-07-18 LAB — CMP (CANCER CENTER ONLY)
ALT: 6 U/L (ref 0–44)
AST: <10 U/L — ABNORMAL LOW (ref 15–41)
Albumin: 3.1 g/dL — ABNORMAL LOW (ref 3.5–5.0)
Alkaline Phosphatase: 94 U/L (ref 38–126)
Anion gap: 4 — ABNORMAL LOW (ref 5–15)
BUN: 9 mg/dL (ref 8–23)
CO2: 29 mmol/L (ref 22–32)
Calcium: 8.5 mg/dL — ABNORMAL LOW (ref 8.9–10.3)
Chloride: 103 mmol/L (ref 98–111)
Creatinine: 0.84 mg/dL (ref 0.61–1.24)
GFR, Estimated: >60 mL/min (ref >60)
Glucose, Bld: 90 mg/dL (ref 70–99)
Potassium: 4.4 mmol/L (ref 3.5–5.1)
Sodium: 136 mmol/L (ref 135–145)
Total Bilirubin: 0.3 mg/dL (ref 0.0–1.2)
Total Protein: 5.6 g/dL — ABNORMAL LOW (ref 6.5–8.1)

## 2024-07-18 LAB — CBC WITH DIFFERENTIAL (CANCER CENTER ONLY)
Abs Immature Granulocytes: 0.21 K/uL — ABNORMAL HIGH (ref 0.00–0.07)
Basophils Absolute: 0.1 K/uL (ref 0.0–0.1)
Basophils Relative: 0 %
Eosinophils Absolute: 0 K/uL (ref 0.0–0.5)
Eosinophils Relative: 0 %
HCT: 29.6 % — ABNORMAL LOW (ref 39.0–52.0)
Hemoglobin: 9.6 g/dL — ABNORMAL LOW (ref 13.0–17.0)
Immature Granulocytes: 1 %
Lymphocytes Relative: 9 %
Lymphs Abs: 1.4 K/uL (ref 0.7–4.0)
MCH: 28.7 pg (ref 26.0–34.0)
MCHC: 32.4 g/dL (ref 30.0–36.0)
MCV: 88.6 fL (ref 80.0–100.0)
Monocytes Absolute: 0.6 K/uL (ref 0.1–1.0)
Monocytes Relative: 4 %
Neutro Abs: 13.9 K/uL — ABNORMAL HIGH (ref 1.7–7.7)
Neutrophils Relative %: 86 %
Platelet Count: 213 K/uL (ref 150–400)
RBC: 3.34 MIL/uL — ABNORMAL LOW (ref 4.22–5.81)
RDW: 16.6 % — ABNORMAL HIGH (ref 11.5–15.5)
WBC Count: 16.2 K/uL — ABNORMAL HIGH (ref 4.0–10.5)
nRBC: 0 % (ref 0.0–0.2)

## 2024-07-18 LAB — MAGNESIUM: Magnesium: 1.6 mg/dL — ABNORMAL LOW (ref 1.7–2.4)

## 2024-07-18 MED ORDER — MAGNESIUM OXIDE -MG SUPPLEMENT 400 (240 MG) MG PO TABS: 400.0000 mg | ORAL_TABLET | Freq: Every day | ORAL | 0 refills | Status: DC

## 2024-07-18 MED ORDER — IOHEXOL 300 MG/ML  SOLN
75.0000 mL | Freq: Once | INTRAMUSCULAR | Status: AC | PRN
  Administered 2024-07-18: 75 mL via INTRAVENOUS

## 2024-07-20 NOTE — Progress Notes (Signed)
 [facility] OFFICE PROGRESS NOTE  Zollie Lowers, MD [address] [place] [address]  DIAGNOSIS: Stage IIb (T3, N0, M0) non-small cell lung cancer, squamous cell carcinoma presented with large cavitary left upper lobe lung mass diagnosed in May 2025. PD-L1 expression was 10%   PRIOR THERAPY:  1) Status post left upper lobectomy with resection of the left lower lobe in addition to en bloc resection of the chest wall including left 3rd and 4th rib on 03/12/2024 under the care of Dr. Kerrin.  2) Adjuvant systemic chemotherapy with cisplatin  75 Mg/M2 and docetaxel  75 Mg/M2 every 3 weeks. First dose 05/03/2024. Status post 4 cycles.   CURRENT THERAPY: Observation   INTERVAL HISTORY: Russell [name] 73 y.o. male returns to the clinic today for a follow-up visit. The patient was last seen in the clinic on 07/04/24 by myself.   The patient completed 4 cycles of adjuvant chemotherapy. He overall tolerated treatment well this most recent cycle of treatment was difficult for him with regard to low energy and fatigue.  Having some worsening anemia which will hopefully improve.  He is feeling tired, cold, and sometimes for breath with activity.   He denies any fever, chills, or night sweats.  Denies any cough, chest pain, or hemoptysis.  He reports a good appetite.  He gained some weight during the course of treatment.  He denies any nausea, vomiting, diarrhea, or constipation.  He does report a rash on his left upper extremity that is pruritic.  The rash is scaly.  He previously had some swelling in the arm that has since subsided.  There is no more warmth or erythema at this time.  There is no thrombophlebitis.  He denies any arm pain.  He has been using Vaseline.  He denies any history of psoriasis or eczema.   He has some abnormal sensation in his feet and he is unsure if it is neuropathy.  He reports it is more of a discomfort as opposed to pain, numbness and tingling, or  pins-and-needles.  He does not take any medications for this.  He has taken gabapentin  in the past for other reasons and does not feel that it was effective.  Denies any recent upper respiratory infections, or burning with urination.  He recently had a restaging CT scan.  He is here today for evaluation and more detailed discussion about his current condition and next steps in his care.      MEDICAL HISTORY: Past Medical History:  Diagnosis Date   Dyspnea    with exertion   GERD (gastroesophageal reflux disease)    History of hiatal hernia 07/22/2006   small - dx by endoscopy   Hx of adenomatous polyp of colon 03/24/2016   Hyperlipidemia    Hypertension    Lung nodule 01/04/2024   left upper lobe   Pneumonia    x 1 as teenager   Pneumothorax after biopsy 10/17/2023   Pulmonary nodule 04/27/2023   Status post lobectomy of lung 03/12/2024    ALLERGIES:  has no known allergies.  MEDICATIONS:  Current Outpatient Medications  Medication Sig Dispense Refill   amLODipine  (NORVASC ) 5 MG tablet TAKE 1 TABLET EVERY DAY FOR BLOOD PRESSURE 90 tablet 3   ascorbic acid (VITAMIN C) 500 MG tablet Take 500 mg by mouth daily. Takes every other day     aspirin  EC 81 MG tablet Take 81 mg by mouth every other day.      baclofen  (LIORESAL ) 10  MG tablet Take 1 tablet (10 mg total) by mouth 3 (three) times daily. (Patient taking differently: Take 10 mg by mouth as needed for muscle spasms.) 30 each 0   Cholecalciferol (VITAMIN D3) 125 MCG (5000 UT) CAPS Take 5,000 Units by mouth every other day.     cyanocobalamin  (VITAMIN B12) 1000 MCG tablet Take 1,000 mcg by mouth every other day.     dexamethasone  (DECADRON ) 4 MG tablet Take 2 tabs (8 mg) twice daily starting the day before docetaxel , the day of docetaxel , and the day after docetaxel , then on days 4 and 5 take 2 tablets in the morning if needed for nausea 16 tablet 0   diphenoxylate -atropine  (LOMOTIL ) 2.5-0.025 MG tablet Take 2 tablets by mouth 4  (four) times daily as needed for diarrhea or loose stools. 30 tablet 5   loperamide  (IMODIUM ) 2 MG capsule Take 1 capsule (2 mg total) by mouth 4 (four) times daily as needed for diarrhea or loose stools. 12 capsule 0   magnesium  oxide (MAG-OX) 400 (240 Mg) MG tablet Take 1 tablet (400 mg total) by mouth daily. 20 tablet 0   nystatin  (MYCOSTATIN ) 100000 UNIT/ML suspension Take 5 mLs (500,000 Units total) by mouth 4 (four) times daily. (Patient taking differently: Take 5 mLs by mouth as needed.) 60 mL 0   olmesartan  (BENICAR ) 40 MG tablet Take 1 tablet (40 mg total) by mouth daily. For blood pressure 90 tablet 3   ondansetron  (ZOFRAN ) 8 MG tablet Take 1 tablet (8 mg total) by mouth every 8 (eight) hours as needed for nausea or vomiting. Begin on the third day after cisplatin  chemotherapy. 30 tablet 1   prochlorperazine  (COMPAZINE ) 10 MG tablet Take 1 tablet (10 mg total) by mouth every 6 (six) hours as needed for nausea or vomiting. 30 tablet 1   rosuvastatin  (CRESTOR ) 10 MG tablet Take 1 tablet (10 mg total) by mouth every other day. 90 tablet 1   No current facility-administered medications for this visit.    SURGICAL HISTORY:  Past Surgical History:  Procedure Laterality Date   BRONCHIAL BIOPSY  10/17/2023   Procedure: BRONCHIAL BIOPSIES;  Surgeon: Shelah Lamar RAMAN, MD;  Location: [facility] ENDOSCOPY;  Service: Pulmonary;;   BRONCHIAL BIOPSY  01/17/2024   Procedure: BRONCHOSCOPY, WITH BIOPSY;  Surgeon: Shelah Lamar RAMAN, MD;  Location: [facility] ENDOSCOPY;  Service: Pulmonary;;   BRONCHIAL BRUSHINGS  10/17/2023   Procedure: BRONCHIAL BRUSHINGS;  Surgeon: Shelah Lamar RAMAN, MD;  Location: [facility] ENDOSCOPY;  Service: Pulmonary;;   BRONCHIAL BRUSHINGS  01/17/2024   Procedure: BRONCHOSCOPY, WITH BRUSH BIOPSY;  Surgeon: Shelah Lamar RAMAN, MD;  Location: [facility] ENDOSCOPY;  Service: Pulmonary;;   BRONCHIAL NEEDLE ASPIRATION BIOPSY  10/17/2023   Procedure: BRONCHIAL NEEDLE ASPIRATION BIOPSIES;  Surgeon: Shelah Lamar RAMAN, MD;   Location: [facility] ENDOSCOPY;  Service: Pulmonary;;   BRONCHIAL NEEDLE ASPIRATION BIOPSY  01/17/2024   Procedure: BRONCHOSCOPY, WITH NEEDLE ASPIRATION BIOPSY;  Surgeon: Shelah Lamar RAMAN, MD;  Location: [facility] ENDOSCOPY;  Service: Pulmonary;;   BRONCHIAL WASHINGS  10/17/2023   Procedure: BRONCHIAL WASHINGS;  Surgeon: Shelah Lamar RAMAN, MD;  Location: [facility] ENDOSCOPY;  Service: Pulmonary;;   BRONCHIAL WASHINGS  01/17/2024   Procedure: IRRIGATION, BRONCHUS;  Surgeon: Shelah Lamar RAMAN, MD;  Location: [facility] ENDOSCOPY;  Service: Pulmonary;;   CHEST WALL RECONSTRUCTION Left 03/12/2024   Procedure: RECONSTRUCTION, MAJOR, CHEST WALL;  Surgeon: Kerrin Elspeth BROCKS, MD;  Location: [facility] OR;  Service: Thoracic;  Laterality: Left;   COLONOSCOPY  09/07/2003   gessner - hx polyp;  colonoscopy x several - last one on 06/04/21   cyst removal from gum     FRACTURE SURGERY Left age 53   Left Knee   FUDUCIAL PLACEMENT  01/17/2024   Procedure: INSERTION, FIDUCIAL MARKER, GOLD;  Surgeon: Shelah Lamar RAMAN, MD;  Location: [facility] ENDOSCOPY;  Service: Pulmonary;;   HERNIA REPAIR     INTERCOSTAL NERVE BLOCK Left 03/12/2024   Procedure: BLOCK, NERVE, INTERCOSTAL;  Surgeon: Kerrin Elspeth BROCKS, MD;  Location: [facility] OR;  Service: Thoracic;  Laterality: Left;   LOBECTOMY, LUNG, ROBOT-ASSISTED, USING VATS Left 03/12/2024   Procedure: LOBECTOMY, LUNG, ROBOT-ASSISTED, USING VATS;  Surgeon: Kerrin Elspeth BROCKS, MD;  Location: [facility] OR;  Service: Thoracic;  Laterality: Left;  LEFT ROBOTIC LEFT UPPER LOBECTOMY, POSSIBLE THORACOTOMY, POSSIBLE CHEST WALL RESECTION   LYMPH NODE BIOPSY Left 03/12/2024   Procedure: LYMPH NODE BIOPSY;  Surgeon: Kerrin Elspeth BROCKS, MD;  Location: [facility] OR;  Service: Thoracic;  Laterality: Left;   THORACOTOMY Left 03/12/2024   Procedure: THORACOTOMY, MAJOR;  Surgeon: Kerrin Elspeth BROCKS, MD;  Location: [facility] OR;  Service: Thoracic;  Laterality: Left;   UPPER GASTROINTESTINAL ENDOSCOPY     UPPER GI ENDOSCOPY     Normal   VIDEO BRONCHOSCOPY  WITH ENDOBRONCHIAL NAVIGATION Left 01/17/2024   Procedure: VIDEO BRONCHOSCOPY WITH ENDOBRONCHIAL NAVIGATION;  Surgeon: Shelah Lamar RAMAN, MD;  Location: [facility] ENDOSCOPY;  Service: Pulmonary;  Laterality: Left;   WISDOM TOOTH EXTRACTION      REVIEW OF SYSTEMS:   Review of Systems  Constitutional: Positive for fatigue. Negative for appetite change, chills, fever and unexpected weight change.  HENT: Negative for mouth sores, nosebleeds, sore throat and trouble swallowing.   Eyes: Negative for eye problems and icterus.  Respiratory: Positive for shortness of breath with exertion. Negative for cough, hemoptysis, and wheezing.   Cardiovascular: Negative for chest pain and leg swelling.  Gastrointestinal: Negative for abdominal pain, constipation, diarrhea, nausea and vomiting.  Genitourinary: Negative for bladder incontinence, difficulty urinating, dysuria, frequency and hematuria.   Musculoskeletal: Negative for back pain, gait problem, neck pain and neck stiffness.  Skin: Positive for scaly rash on left dorsal hand. .  Neurological: Negative for dizziness, extremity weakness, gait problem, headaches, light-headedness and seizures.  Hematological: Negative for adenopathy. Does not bruise/bleed easily.  Psychiatric/Behavioral: Negative for confusion, depression and sleep disturbance. The patient is not nervous/anxious.     PHYSICAL EXAMINATION:  There were no vitals taken for this visit.  ECOG PERFORMANCE STATUS: 1  Physical Exam  Constitutional: Oriented to person, place, and time and well-developed, well-nourished, and in no distress.  HENT:  Head: Normocephalic and atraumatic.  Mouth/Throat: Oropharynx is clear and moist. No oropharyngeal exudate.  Eyes: Conjunctivae are normal. Right eye exhibits no discharge. Left eye exhibits no discharge. No scleral icterus.  Neck: Normal range of motion. Neck supple.  Cardiovascular: Normal rate, regular rhythm, normal heart sounds and intact distal  pulses.   Pulmonary/Chest: Effort normal and breath sounds normal. No respiratory distress. No wheezes. No rales.  Abdominal: Soft. Bowel sounds are normal. Exhibits no distension and no mass. There is no tenderness.  Musculoskeletal: Normal range of motion. Exhibits no edema.  Lymphadenopathy:    No cervical adenopathy.  Neurological: Alert and oriented to person, place, and time. Exhibits normal muscle tone. Gait normal. Coordination normal.  Skin: Skin is warm and dry. Positive for scaly rash on left dorsal hand/wrist. Not diaphoretic. No erythema. No pallor.  Psychiatric: Mood, memory and judgment normal.  Vitals reviewed.  LABORATORY DATA: Lab  Results  Component Value Date   WBC 16.2 (H) 07/18/2024   HGB 9.6 (L) 07/18/2024   HCT 29.6 (L) 07/18/2024   MCV 88.6 07/18/2024   PLT 213 07/18/2024      Chemistry      Component Value Date/Time   NA 136 07/18/2024 0838   NA 139 04/11/2024 0917   K 4.4 07/18/2024 0838   CL 103 07/18/2024 0838   CO2 29 07/18/2024 0838   BUN 9 07/18/2024 0838   BUN 16 04/11/2024 0917   CREATININE 0.84 07/18/2024 0838      Component Value Date/Time   CALCIUM  8.5 (L) 07/18/2024 0838   ALKPHOS 94 07/18/2024 0838   AST <10 (L) 07/18/2024 0838   ALT 6 07/18/2024 0838   BILITOT 0.3 07/18/2024 0838       RADIOGRAPHIC STUDIES:  CT Chest W Contrast Result Date: 07/18/2024 CLINICAL DATA:  Non-small cell lung cancer, nonmetastatic, assess treatment response. * Tracking Code: BO * EXAM: CT CHEST WITH CONTRAST TECHNIQUE: Multidetector CT imaging of the chest was performed during intravenous contrast administration. RADIATION DOSE REDUCTION: This exam was performed according to the departmental dose-optimization program which includes automated exposure control, adjustment of the mA and/or kV according to patient size and/or use of iterative reconstruction technique. CONTRAST:  75mL OMNIPAQUE IOHEXOL 300 MG/ML  SOLN COMPARISON:  02/29/2024 and PET  09/09/2023. FINDINGS: Cardiovascular: Atherosclerotic calcification of the aorta, aortic valve and coronary arteries. Heart is at the upper limits of normal in size to mildly enlarged. No pericardial effusion. Mediastinum/Nodes: Thoracic inlet lymph nodes are not enlarged by CT size criteria. No pathologically enlarged mediastinal, hilar or axillary lymph nodes. Low left internal jugular lymph node measures 7 mm (2/17), similar. There may be mild distal esophageal wall thickening which can be seen with gastroesophageal reflux disease. Lungs/Pleura: Centrilobular emphysema. Interval left upper lobectomy with a small to moderate loculated left pleural effusion in the upper and mid left hemithorax. Postoperative scarring in the left lower lobe. Lungs are otherwise clear. Airway is otherwise unremarkable. Upper Abdomen: Nodular thickening of the adrenal glands, unchanged from 09/09/2023 and not hypermetabolic at that time. Small hiatal hernia. Visualized portions of the liver, gallbladder, adrenal glands, kidneys, spleen, pancreas, stomach and bowel are otherwise grossly unremarkable. No upper abdominal adenopathy. Musculoskeletal: Degenerative changes in the spine. Osteopenia. Interval left thoracotomies. No worrisome lytic or sclerotic lesions. Probable sebaceous cyst along the upper medial left back. IMPRESSION: 1. Interval left upper lobectomy with postoperative changes in the left lower lobe. No evidence of metastatic disease. 2. Small to moderate loculated left pleural effusion. 3. Aortic atherosclerosis (ICD10-I70.0). Coronary artery calcification. 4.  Emphysema (ICD10-J43.9). Electronically Signed   By: Newell Eke M.D.   On: 07/18/2024 08:47     ASSESSMENT/PLAN:  This is a very pleasant 73 year old Caucasian male with stage IIb (T3, N0, M0) non-small cell lung cancer, squamous cell carcinoma presented with large cavitary left upper lobe lung mass status post left upper lobectomy with resection of the  left lower lobe in addition to en bloc resection of the chest wall including left 3rd and 4th rib on 03/12/2024 under the care of Dr. Kerrin PD-L1 expression was 10%   He completed  adjuvant chemotherapy with cisplatin  and docetaxel . His first dose was on 05/03/24. He is status post 4 cycles.   The patient was seen with Dr. Sherrod today.  Dr. Sherrod personally and independently reviewed the scan and discussed results with the patient today.  The scan showed  no evidence of disease progression.  Discussed observation versus adjuvant immunotherapy with Tecentriq IV every 3 weeks.  Dr. Sherrod discussed that it is approved for PD-L1 expression greater than 1% but most of the benefit was in patients with a PD-L1 expression of greater than 50%.  We also discussed the side effects of immunotherapy including but not limited to immunotherapy mediated skin rash, diarrhea, inflammation of the lung, kidney, liver, thyroid  or other endocrine dysfunction  Gave the patient a handout of Tecentriq to read about.   If he decides to proceed with Tecentriq we would recommend doing this in the next 1 to 2 weeks.   He decides not to undergo immunotherapy then we would recommend labs and CT scan in 4 months and follow-up visit 1 week later.  Anemia secondary to chemotherapy Anemia likely due to recent chemotherapy, contributing to fatigue and cold intolerance. Expected to improve as blood counts recover. - Monitor blood counts and symptoms.  Peripheral neuropathy secondary to chemotherapy Peripheral neuropathy likely due to chemotherapy, presenting as mild discomfort in feet. Discussed potential use of gabapentin  if symptoms worsen. - Monitor neuropathy symptoms. - Consider gabapentin  if symptoms worsen.   Dry, itchy, scaly skin of right arm Dry, itchy, scaly skin on right arm, possibly related to previous IV access. Symptoms improving with topical treatment. - Apply cortisone cream. - Use lotion twice  daily. - Elevate arm at home. - Monitor for infection or worsening symptoms. There is no swelling at this time, warmth, erythema, or drainage.     The patient was advised to call immediately if he has any concerning symptoms in the interval. The patient voices understanding of current disease status and treatment options and is in agreement with the current care plan. All questions were answered. The patient knows to call the clinic with any problems, questions or concerns. We can certainly see the patient much sooner if necessary  No orders of the defined types were placed in this encounter.    Averi Kilty L Edgar Reisz, PA-C 07/20/24  ADDENDUM: Hematology/Oncology Attending:  I had a face-to-face encounter with the patient today.  I reviewed his record, lab, scan and recommended his care plan.  This is a very pleasant 73 years old with stage IIb non-small cell lung cancer, squamous cell carcinoma diagnosed in May 2025 with PD-L1 expression of 10%.  He underwent left upper lobectomy with en bloc resection of the left lower lobe chest wall including left 3rd and 4th rib.  This was followed by adjuvant systemic chemotherapy with cisplatin  and docetaxel  every 3 weeks for 4 cycles.  The patient tolerated his treatment well and he is recovering well from his recent chemotherapy. He had repeat CT scan of the chest performed recently.  I personally and independently reviewed the scan and discussed the result with the patient and his wife.  His scan showed no concerning findings for disease recurrence or metastasis.  I had a lengthy discussion with the patient and his wife today about his current condition and treatment options.  I discussed with the patient the role of adjuvant immunotherapy with atezolizumab which is approved for patient with PD-L1 expression of 1% or higher but most of the benefit was seen on patients with higher PD-L1 expression especially those with PD-L1 expression of 50% or  higher. The patient would like some time to think about his options before making a decision but if he decides against the adjuvant immunotherapy, we will see him back for follow-up visit in 4 months  with repeat CT scan of the chest for restaging of his disease. The patient was advised to call immediately if he has any concerning symptoms in the interval. Disclaimer: This note was dictated with voice recognition software. Similar sounding words can inadvertently be transcribed and may be missed upon review. Sherrod MARLA Sherrod, MD

## 2024-07-25 ENCOUNTER — Inpatient Hospital Stay

## 2024-07-25 ENCOUNTER — Inpatient Hospital Stay: Admitting: Physician Assistant

## 2024-07-25 VITALS — BP 130/50 | HR 67 | Temp 97.7°F | Resp 16 | Ht 68.0 in | Wt 151.0 lb

## 2024-07-25 DIAGNOSIS — C3412 Malignant neoplasm of upper lobe, left bronchus or lung: Secondary | ICD-10-CM

## 2024-07-25 DIAGNOSIS — G620 Drug-induced polyneuropathy: Secondary | ICD-10-CM | POA: Diagnosis not present

## 2024-07-25 DIAGNOSIS — D6481 Anemia due to antineoplastic chemotherapy: Secondary | ICD-10-CM | POA: Diagnosis not present

## 2024-07-25 LAB — CBC WITH DIFFERENTIAL (CANCER CENTER ONLY)
Abs Immature Granulocytes: 0.14 K/uL — ABNORMAL HIGH (ref 0.00–0.07)
Basophils Absolute: 0.1 K/uL (ref 0.0–0.1)
Basophils Relative: 1 %
Eosinophils Absolute: 0.1 K/uL (ref 0.0–0.5)
Eosinophils Relative: 1 %
HCT: 27.7 % — ABNORMAL LOW (ref 39.0–52.0)
Hemoglobin: 9.1 g/dL — ABNORMAL LOW (ref 13.0–17.0)
Immature Granulocytes: 1 %
Lymphocytes Relative: 13 %
Lymphs Abs: 1.9 K/uL (ref 0.7–4.0)
MCH: 29.6 pg (ref 26.0–34.0)
MCHC: 32.9 g/dL (ref 30.0–36.0)
MCV: 90.2 fL (ref 80.0–100.0)
Monocytes Absolute: 1.1 K/uL — ABNORMAL HIGH (ref 0.1–1.0)
Monocytes Relative: 7 %
Neutro Abs: 11.9 K/uL — ABNORMAL HIGH (ref 1.7–7.7)
Neutrophils Relative %: 77 %
Platelet Count: 287 K/uL (ref 150–400)
RBC: 3.07 MIL/uL — ABNORMAL LOW (ref 4.22–5.81)
RDW: 17.4 % — ABNORMAL HIGH (ref 11.5–15.5)
WBC Count: 15.2 K/uL — ABNORMAL HIGH (ref 4.0–10.5)
nRBC: 0 % (ref 0.0–0.2)

## 2024-07-25 LAB — CMP (CANCER CENTER ONLY)
ALT: 8 U/L (ref 0–44)
AST: 14 U/L — ABNORMAL LOW (ref 15–41)
Albumin: 3.5 g/dL (ref 3.5–5.0)
Alkaline Phosphatase: 84 U/L (ref 38–126)
Anion gap: 8 (ref 5–15)
BUN: 13 mg/dL (ref 8–23)
CO2: 27 mmol/L (ref 22–32)
Calcium: 8.5 mg/dL — ABNORMAL LOW (ref 8.9–10.3)
Chloride: 105 mmol/L (ref 98–111)
Creatinine: 1.03 mg/dL (ref 0.61–1.24)
GFR, Estimated: >60 mL/min (ref >60)
Glucose, Bld: 88 mg/dL (ref 70–99)
Potassium: 4.3 mmol/L (ref 3.5–5.1)
Sodium: 140 mmol/L (ref 135–145)
Total Bilirubin: <0.2 mg/dL (ref 0.0–1.2)
Total Protein: 5.5 g/dL — ABNORMAL LOW (ref 6.5–8.1)

## 2024-07-25 LAB — MAGNESIUM: Magnesium: 2.1 mg/dL (ref 1.7–2.4)

## 2024-07-25 NOTE — Patient Instructions (Signed)
 Russell [name]

## 2024-07-27 ENCOUNTER — Telehealth: Payer: Self-pay | Admitting: Internal Medicine

## 2024-07-27 NOTE — Telephone Encounter (Signed)
 Scheduled patient for next appointment. Called and spoke with the patient, he is aware.

## 2024-07-30 ENCOUNTER — Other Ambulatory Visit: Payer: Self-pay | Admitting: Family Medicine

## 2024-07-30 DIAGNOSIS — I10 Essential (primary) hypertension: Secondary | ICD-10-CM

## 2024-08-16 ENCOUNTER — Encounter: Payer: Self-pay | Admitting: Family Medicine

## 2024-08-16 ENCOUNTER — Ambulatory Visit: Payer: Self-pay | Admitting: Family Medicine

## 2024-08-16 VITALS — BP 108/66 | HR 54 | Temp 97.8°F | Ht 68.0 in | Wt 150.0 lb

## 2024-08-16 DIAGNOSIS — N401 Enlarged prostate with lower urinary tract symptoms: Secondary | ICD-10-CM

## 2024-08-16 DIAGNOSIS — R931 Abnormal findings on diagnostic imaging of heart and coronary circulation: Secondary | ICD-10-CM | POA: Diagnosis not present

## 2024-08-16 DIAGNOSIS — R351 Nocturia: Secondary | ICD-10-CM | POA: Diagnosis not present

## 2024-08-16 LAB — CMP14+EGFR
ALT: 9 IU/L (ref 0–44)
AST: 14 IU/L (ref 0–40)
Albumin: 3.9 g/dL (ref 3.8–4.8)
Alkaline Phosphatase: 75 IU/L (ref 47–123)
BUN/Creatinine Ratio: 20 (ref 10–24)
BUN: 19 mg/dL (ref 8–27)
Bilirubin Total: 0.4 mg/dL (ref 0.0–1.2)
CO2: 22 mmol/L (ref 20–29)
Calcium: 9 mg/dL (ref 8.6–10.2)
Chloride: 105 mmol/L (ref 96–106)
Creatinine, Ser: 0.94 mg/dL (ref 0.76–1.27)
Globulin, Total: 2.3 g/dL (ref 1.5–4.5)
Glucose: 91 mg/dL (ref 70–99)
Potassium: 5.1 mmol/L (ref 3.5–5.2)
Sodium: 138 mmol/L (ref 134–144)
Total Protein: 6.2 g/dL (ref 6.0–8.5)
eGFR: 86 mL/min/1.73 (ref >59)

## 2024-08-16 LAB — LIPID PANEL
Chol/HDL Ratio: 2.7 ratio (ref 0.0–5.0)
Cholesterol, Total: 166 mg/dL (ref 100–199)
HDL: 61 mg/dL (ref >39)
LDL Chol Calc (NIH): 95 mg/dL (ref 0–99)
Triglycerides: 50 mg/dL (ref 0–149)
VLDL Cholesterol Cal: 10 mg/dL (ref 5–40)

## 2024-08-16 MED ORDER — ESCITALOPRAM OXALATE 10 MG PO TABS: 10.0000 mg | ORAL_TABLET | Freq: Every day | ORAL | 1 refills | Status: AC

## 2024-08-16 NOTE — Progress Notes (Signed)
 Subjective:  Patient ID: Russell [name], male    DOB: 1951-03-26  Age: 73 y.o. MRN: [id number]  CC: Medical Management of Chronic Issues (No concerns at this time. )   HPI  Discussed the use of AI scribe software for clinical note transcription with the patient, who gave verbal consent to proceed.  History of Present Illness Russell [name] is a 73 year old male with lung cancer who presents for follow-up after completing chemotherapy.  He completed his last chemotherapy treatment at the end of October and has been experiencing significant fatigue and lack of energy. He experiences muscle and joint aches. He is currently taking blood pressure medication, B12 supplements daily, and rosuvastatin  every other day for cholesterol management. Magnesium  was prescribed but not continued.  He experiences frequent urination, approximately every two hours during the day and night. He reports improved ability to fall asleep and nap, which he attributes to the fatigue from his treatments. Despite frequent nighttime awakenings to urinate, he is able to return to sleep quickly.  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 He denies suicidal ideation despite marking it on a survey, attributing it to a misunderstanding. He acknowledges feeling 'down' and 'hopeless' due to his current limitations and the cumulative stress of his medical treatments.          08/16/2024    8:49 AM 07/04/2024    9:20 AM 06/13/2024    9:39 AM  Depression screen PHQ 2/9  Decreased Interest 2 0 0  Down, Depressed, Hopeless 2 0 0  PHQ - 2 Score 4 0 0  Altered sleeping 2    Tired, decreased energy 2    Change in appetite 1    Feeling bad or failure about yourself  1    Trouble concentrating 2    Moving slowly or fidgety/restless 1    Suicidal thoughts 1    PHQ-9 Score 14    Difficult doing work/chores Somewhat  difficult      History Russell [name] has a past medical history of Dyspnea, GERD (gastroesophageal reflux disease), History of hiatal hernia (07/22/2006), adenomatous polyp of colon (03/24/2016), Hyperlipidemia, Hypertension, Lung nodule (01/04/2024), Pneumonia, Pneumothorax after biopsy (10/17/2023), Pulmonary nodule (04/27/2023), and 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   He has a past surgical history that includes Hernia repair; Fracture surgery (Left, age 51); Wisdom tooth extraction; Colonoscopy (09/07/2003); Upper gi endoscopy; Upper gastrointestinal endoscopy; cyst removal from gum; Bronchial needle aspiration biopsy (10/17/2023); Bronchial biopsy (10/17/2023); Bronchial brushings (10/17/2023); Bronchial washings (10/17/2023); Video bronchoscopy with endobronchial navigation (Left, 01/17/2024); Bronchial washings (01/17/2024); Bronchial needle aspiration biopsy (01/17/2024); Bronchial brushings (01/17/2024); Bronchial biopsy (01/17/2024); Fuducial placement (01/17/2024); Lobectomy, lung, robot-assisted, using vats (Left, 03/12/2024); Thoracotomy (Left, 03/12/2024); Chest wall reconstruction (Left, 03/12/2024); Intercostal nerve block (Left, 03/12/2024); and Lymph node biopsy (Left, 03/12/2024).   His family history includes Diabetes in his mother; Heart disease in his mother; Hyperlipidemia in his father.He reports that he quit smoking about 7 months ago. His smoking use included cigarettes. He has a 45 pack-year smoking history. He has never used smokeless tobacco. He reports current alcohol use of about 14.0 standard drinks of alcohol per week. He reports that he does not use drugs.    ROS Review of Systems  Constitutional: Negative.   HENT: Negative.    Eyes:  Negative for visual disturbance.  Respiratory:  Negative for cough and shortness of breath.  Cardiovascular:  Negative for chest pain and leg swelling.  Gastrointestinal:  Negative for abdominal pain, diarrhea, nausea and vomiting.   Genitourinary:  Positive for frequency (with nocturia X 2-3/ night). Negative for difficulty urinating.  Musculoskeletal:  Negative for arthralgias and myalgias.  Skin:  Negative for rash.  Neurological:  Negative for headaches.  Psychiatric/Behavioral:  Negative for sleep disturbance.     Objective:  BP 108/66   Pulse (!) 54   Temp 97.8 F (36.6 C)   Ht 5' 8 (1.727 m)   Wt 150 lb (68 kg)   SpO2 100%   BMI 22.81 kg/m   BP Readings from Last 3 Encounters:  08/16/24 108/66  07/25/24 (!) 130/50  07/06/24 (!) 142/61    Wt Readings from Last 3 Encounters:  08/16/24 150 lb (68 kg)  07/25/24 151 lb (68.5 kg)  07/04/24 148 lb 8 oz (67.4 kg)     Physical Exam Physical Exam GENERAL: Alert, cooperative, well developed, no acute distress HEENT: Normocephalic, normal oropharynx, moist mucous membranes CHEST: Clear to auscultation bilaterally, no wheezes, rhonchi, or crackles, lungs normal CARDIOVASCULAR: Normal heart rate and rhythm, S1 and S2 normal without murmurs ABDOMEN: Soft, non-tender, non-distended, without organomegaly, normal bowel sounds EXTREMITIES: No cyanosis or edema NEUROLOGICAL: Cranial nerves grossly intact, moves all extremities without gross motor or sensory deficit   Assessment & Plan:  BPH associated with nocturia  Elevated coronary artery calcium  score -     CMP14+EGFR -     Lipid panel  Other orders -     amLODIPine  Besylate; TAKE 1 TABLET EVERY DAY  FOR BLOOD PRESSURE  Dispense: 90 tablet; Refill: 3 -     Escitalopram  Oxalate; Take 1 tablet (10 mg total) by mouth daily.  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 A follow-up scan is scheduled for March to confirm remission status.  Major depressive disorder   He experiences significant depressive  symptoms post-chemotherapy, including fatigue, irritability, and feelings of hopelessness, though there is no suicidal ideation. Depression is likely exacerbated by recent cancer treatment and recovery. Prescribed escitalopram  (Lexapro ) once daily, preferably in the evening. Discussed potential side effects, including interference with sleep, and advised taking it in the morning if needed. Encouraged professional counseling if symptoms persist or worsen.  Benign prostatic hyperplasia with lower urinary tract symptoms   He experiences frequent urination every couple of hours during the day and night, which interferes with sleep. He prefers to avoid medication at this time. Discussed potential benefits of medication for sleep improvement if symptoms worsen.  Primary hypertension   Blood pressure management is ongoing with the current medication regimen.  Elevated coronary artery calcium  score   He is currently on rosuvastatin  every other day for risk reduction. Discussed the potential to increase frequency to daily if tolerated, as daily dosing is recommended for optimal risk reduction. Continue rosuvastatin  every other day and will consider increasing to daily dosing if tolerated and side effects are manageable.       Follow-up: Return in about 3 months (around 11/14/2024).  Butler Russell [name], M.D.

## 2024-08-18 ENCOUNTER — Ambulatory Visit: Payer: Self-pay | Admitting: Family Medicine

## 2024-08-18 NOTE — Progress Notes (Signed)
Hello Kealan,  Your lab result is normal and/or stable.Some minor variations that are not significant are commonly marked abnormal, but do not represent any medical problem for you.  Best regards, Lori-Ann Lindfors, M.D.

## 2024-08-20 ENCOUNTER — Other Ambulatory Visit: Payer: Self-pay | Admitting: Thoracic Surgery (Cardiothoracic Vascular Surgery)

## 2024-08-20 DIAGNOSIS — R911 Solitary pulmonary nodule: Secondary | ICD-10-CM

## 2024-08-21 ENCOUNTER — Inpatient Hospital Stay (HOSPITAL_COMMUNITY): Admission: RE | Admit: 2024-08-21 | Discharge: 2024-08-21 | Attending: Internal Medicine | Admitting: Internal Medicine

## 2024-08-21 ENCOUNTER — Ambulatory Visit

## 2024-08-21 ENCOUNTER — Ambulatory Visit: Admitting: Thoracic Surgery (Cardiothoracic Vascular Surgery)

## 2024-08-21 VITALS — BP 130/65 | HR 50 | Resp 20 | Ht 68.0 in | Wt 153.0 lb

## 2024-08-21 DIAGNOSIS — R911 Solitary pulmonary nodule: Secondary | ICD-10-CM | POA: Diagnosis present

## 2024-08-21 DIAGNOSIS — Z09 Encounter for follow-up examination after completed treatment for conditions other than malignant neoplasm: Secondary | ICD-10-CM | POA: Diagnosis not present

## 2024-08-21 NOTE — Progress Notes (Signed)
 [address] [place] [address]             [phone]       HPI:  Russell [name] is a 73 year old man with a history of tobacco abuse who had a robotic assisted left upper lobectomy with en bloc resection of chest wall and reconstruction on 03/12/2024 with Dr. Kerrin.  Postoperative course complicated by an air leak and went home with a chest tube.  That resolved and his tube was removed in the office. He presents today for continued follow up. He was last seen in office on 05/22/2024.   He reports that he is doing ok.  He has finished all 4 cycles of chemotherapy and finds that this made him feel very weak and fatigued. He is still experiencing a sharp sensation with certain movements or when trying to lay on left side. It is not constant and does not require the use of any medications.    Allergies as of 08/21/2024   No Known Allergies      Medication List        Accurate as of August 21, 2024  9:06 AM. If you have any questions, ask your nurse or doctor.          amLODipine  5 MG tablet Commonly known as: NORVASC  TAKE 1 TABLET EVERY DAY  FOR BLOOD PRESSURE   aspirin  EC 81 MG tablet Take 81 mg by mouth every other day.   cyanocobalamin  1000 MCG tablet Commonly known as: VITAMIN B12 Take 1,000 mcg by mouth every other day.   escitalopram  10 MG tablet Commonly known as: LEXAPRO  Take 1 tablet (10 mg total) by mouth daily.   rosuvastatin  10 MG tablet Commonly known as: Crestor  Take 1 tablet (10 mg total) by mouth every other day.   Vitamin D3 125 MCG (5000 UT) Caps Take 5,000 Units by mouth every other day.         ROS  Review of Systems  Constitutional:  Positive for malaise/fatigue.  Respiratory:  Negative for cough and shortness of breath.   Cardiovascular:  Negative for chest pain, palpitations and leg swelling.     BP 130/65   Pulse (!) 50   Resp 20   Ht 5' 8 (1.727 m)   Wt 153 lb (69.4 kg)   SpO2 99% Comment:  RA  BMI 23.26 kg/m    Physical Exam Constitutional:      Appearance: Normal appearance.  HENT:     Head: Normocephalic and atraumatic.  Cardiovascular:     Rate and Rhythm: Normal rate and regular rhythm.     Heart sounds: Normal heart sounds, S1 normal and S2 normal.  Pulmonary:     Effort: Pulmonary effort is normal.     Breath sounds: Normal breath sounds.  Skin:    General: Skin is warm and dry.  Neurological:     General: No focal deficit present.     Mental Status: He is alert and oriented to person, place, and time.      Imaging: EXAM: 2 VIEW(S) XRAY OF THE CHEST 08/21/2024 08:39:44 AM   COMPARISON: 05/22/2024   CLINICAL HISTORY: lung cancer   FINDINGS:   LUNGS AND PLEURA: Stable left apex opacity. Left upper lobe volume loss. No pleural effusion. No pneumothorax.   HEART AND MEDIASTINUM: No acute abnormality of the cardiac and mediastinal silhouettes.   BONES AND SOFT TISSUES: Left third rib destruction.  IMPRESSION: 1. Stable left apex opacity with left third rib destruction and left upper lobe volume loss.   Electronically signed by: Evalene Coho MD 08/21/2024 08:56 AM EST RP Workstation: [id number]     Assessment/Plan:  Surgery follow-up examination/ s/p Xi robotic-assisted left upper lobectomy -He continues to progress after surgery and chemotherapy -He has finished all cycles of chemotherapy and has follow with oncology scheduled in March of 2026 with CT scan -Follow up with [facility] in 6 months with chest xray    Manuelita CHRISTELLA Rough, PA-C 9:06 AM 08/21/2024

## 2024-08-21 NOTE — Patient Instructions (Signed)
-  Follow up in 6 months with chest xray -Continue follow up with oncology as scheduled

## 2024-10-15 ENCOUNTER — Ambulatory Visit: Payer: Self-pay | Admitting: Family Medicine

## 2024-11-08 ENCOUNTER — Inpatient Hospital Stay: Attending: Internal Medicine

## 2024-11-15 ENCOUNTER — Ambulatory Visit: Admitting: Family Medicine

## 2024-11-15 ENCOUNTER — Inpatient Hospital Stay: Admitting: Internal Medicine

## 2024-11-20 ENCOUNTER — Ambulatory Visit: Admitting: Family Medicine

## 2025-02-19 ENCOUNTER — Ambulatory Visit

## 2025-04-15 ENCOUNTER — Encounter: Payer: Self-pay | Admitting: Family Medicine
